# Patient Record
Sex: Female | Born: 1990 | Race: Black or African American | Hispanic: No | Marital: Married | Smoking: Never smoker
Health system: Southern US, Community
[De-identification: ages and names within clinical notes are randomized; demographics above are authoritative.]

## PROBLEM LIST (undated history)

## (undated) ENCOUNTER — Inpatient Hospital Stay (HOSPITAL_COMMUNITY): Payer: Self-pay

## (undated) ENCOUNTER — Inpatient Hospital Stay (HOSPITAL_COMMUNITY): Payer: No Typology Code available for payment source

## (undated) DIAGNOSIS — G43909 Migraine, unspecified, not intractable, without status migrainosus: Secondary | ICD-10-CM

## (undated) DIAGNOSIS — K589 Irritable bowel syndrome without diarrhea: Secondary | ICD-10-CM

## (undated) DIAGNOSIS — R002 Palpitations: Secondary | ICD-10-CM

## (undated) DIAGNOSIS — K5900 Constipation, unspecified: Secondary | ICD-10-CM

## (undated) DIAGNOSIS — A64 Unspecified sexually transmitted disease: Secondary | ICD-10-CM

## (undated) DIAGNOSIS — K219 Gastro-esophageal reflux disease without esophagitis: Secondary | ICD-10-CM

## (undated) DIAGNOSIS — D649 Anemia, unspecified: Secondary | ICD-10-CM

## (undated) DIAGNOSIS — F329 Major depressive disorder, single episode, unspecified: Secondary | ICD-10-CM

## (undated) DIAGNOSIS — E669 Obesity, unspecified: Secondary | ICD-10-CM

## (undated) DIAGNOSIS — F32A Depression, unspecified: Secondary | ICD-10-CM

## (undated) DIAGNOSIS — K5909 Other constipation: Secondary | ICD-10-CM

## (undated) DIAGNOSIS — F411 Generalized anxiety disorder: Secondary | ICD-10-CM

## (undated) HISTORY — PX: CHOLECYSTECTOMY: SHX55

## (undated) HISTORY — DX: Obesity, unspecified: E66.9

## (undated) HISTORY — DX: Migraine, unspecified, not intractable, without status migrainosus: G43.909

## (undated) HISTORY — DX: Gastro-esophageal reflux disease without esophagitis: K21.9

## (undated) HISTORY — PX: THERAPEUTIC ABORTION: SHX798

## (undated) HISTORY — DX: Palpitations: R00.2

## (undated) HISTORY — DX: Other constipation: K59.09

## (undated) HISTORY — DX: Irritable bowel syndrome, unspecified: K58.9

## (undated) HISTORY — DX: Unspecified sexually transmitted disease: A64

## (undated) HISTORY — DX: Constipation, unspecified: K59.00

## (undated) HISTORY — DX: Generalized anxiety disorder: F41.1

---
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
## 2009-01-15 ENCOUNTER — Ambulatory Visit: Payer: Self-pay | Admitting: Internal Medicine

## 2009-01-21 ENCOUNTER — Ambulatory Visit: Payer: Self-pay | Admitting: Internal Medicine

## 2009-03-24 ENCOUNTER — Encounter: Payer: Self-pay | Admitting: Internal Medicine

## 2009-03-24 ENCOUNTER — Ambulatory Visit: Payer: Self-pay | Admitting: Internal Medicine

## 2009-03-26 ENCOUNTER — Encounter (INDEPENDENT_AMBULATORY_CARE_PROVIDER_SITE_OTHER): Payer: Self-pay | Admitting: *Deleted

## 2009-04-20 ENCOUNTER — Ambulatory Visit: Payer: Self-pay | Admitting: Internal Medicine

## 2009-09-15 ENCOUNTER — Ambulatory Visit: Payer: Self-pay | Admitting: Internal Medicine

## 2009-09-15 DIAGNOSIS — K5900 Constipation, unspecified: Secondary | ICD-10-CM

## 2009-09-15 DIAGNOSIS — R21 Rash and other nonspecific skin eruption: Secondary | ICD-10-CM | POA: Insufficient documentation

## 2009-09-15 DIAGNOSIS — R635 Abnormal weight gain: Secondary | ICD-10-CM

## 2009-09-15 HISTORY — DX: Constipation, unspecified: K59.00

## 2009-09-15 LAB — CONVERTED CEMR LAB: TSH: 1.39 microintl units/mL (ref 0.35–5.50)

## 2009-09-17 ENCOUNTER — Encounter (INDEPENDENT_AMBULATORY_CARE_PROVIDER_SITE_OTHER): Payer: Self-pay | Admitting: *Deleted

## 2009-11-20 ENCOUNTER — Emergency Department (HOSPITAL_COMMUNITY): Admission: EM | Admit: 2009-11-20 | Discharge: 2009-11-20 | Payer: Self-pay | Admitting: Emergency Medicine

## 2009-11-20 ENCOUNTER — Telehealth: Payer: Self-pay | Admitting: Internal Medicine

## 2010-01-27 ENCOUNTER — Ambulatory Visit: Payer: Self-pay | Admitting: Internal Medicine

## 2010-01-27 ENCOUNTER — Encounter: Payer: Self-pay | Admitting: Internal Medicine

## 2010-01-27 DIAGNOSIS — R079 Chest pain, unspecified: Secondary | ICD-10-CM | POA: Insufficient documentation

## 2010-01-27 DIAGNOSIS — F329 Major depressive disorder, single episode, unspecified: Secondary | ICD-10-CM

## 2010-01-27 DIAGNOSIS — F419 Anxiety disorder, unspecified: Secondary | ICD-10-CM

## 2010-01-27 DIAGNOSIS — F411 Generalized anxiety disorder: Secondary | ICD-10-CM

## 2010-01-27 HISTORY — DX: Generalized anxiety disorder: F41.1

## 2010-01-28 ENCOUNTER — Ambulatory Visit: Payer: Self-pay | Admitting: Internal Medicine

## 2010-03-10 ENCOUNTER — Ambulatory Visit: Payer: Self-pay | Admitting: Internal Medicine

## 2010-03-12 ENCOUNTER — Encounter (INDEPENDENT_AMBULATORY_CARE_PROVIDER_SITE_OTHER): Payer: Self-pay | Admitting: *Deleted

## 2010-04-27 NOTE — Assessment & Plan Note (Signed)
Summary: DEPO/ Sammuel Cooper Natale Milch  Nurse Visit   Allergies: No Known Drug Allergies  Medication Administration  Injection # 1:    Medication: Depo-Provera 150mg     Diagnosis: FAMILY PLANNING (ICD-V25.09)    Route: IM    Site: RUOQ gluteus    Exp Date: 05/27/2011    Lot #: [id number]    Mfr: Francisca December    Patient tolerated injection without complications    Given by: Lucious Groves (April 20, 2009 11:11 AM)  Orders Added: 1)  Depo-Provera 150mg  [J1055] 2)  Admin of Therapeutic Inj  intramuscular or subcutaneous [04540]

## 2010-04-27 NOTE — Progress Notes (Signed)
Summary: ER - CP  Phone Note Call from Patient Call back at [facility] Phone [phone]   Summary of Call: Pt c/o CP off and on x's mths. She c/o chest pain & pressure this am that is more on left side. She also has shoulder discomfort. Pain is not relieved from OTC antacid. She has no SOB now but does when walking up stairs in her home. Advised her to go to ER now, she agreed. Pt will f/u after ER w/Dr [name].  Initial call taken by: Lamar Sprinkles, CMA,  November 20, 2009 12:19 PM  Follow-up for Phone Call        agree Follow-up by: Corwin Levins MD,  November 20, 2009 12:43 PM

## 2010-04-27 NOTE — Assessment & Plan Note (Signed)
Summary: STOMACH PAIN/NWS   Vital Signs:  Patient profile:   20 year old female Height:      70 inches (177.80 cm) Weight:      219.2 pounds (99.64 kg) BMI:     31.57 O2 Sat:      98 % on Room air Temp:     98.5 degrees F (36.94 degrees C) oral Pulse rate:   73 / minute BP sitting:   112 / 78  (left arm) Cuff size:   large  Vitals Entered By: Orlan Leavens (September 15, 2009 4:16 PM)  O2 Flow:  Room air CC: Stomach pain & nausea x's 1 month Is Patient Diabetic? No Pain Assessment Patient in pain? yes     Location: stomach Type: aching/cramping Comments Pt states she have'nt had a bowel movement in 1 week   CC:  Stomach pain & nausea x's 1 month.  History of Present Illness: here with above CC;  c/o no BM for 1 wk, with some crampy abd pain and nausea, but no vomiting, and is passing gas;  no fever, other abd pain, BRBPR;  last BM hard with straining; admits to poor diet, lack of regular excercise, gained 90 lbs in the past year on depoprovera, but finally stopped last month and just had initial menses last wk.  Has not tried any OTC's or stool softner.  No prior significant hx of this in the past;  no wt loss, night sweats, joint pains or rash or other constitutional symtpoms, except has had darkening nondiscrete area of bilat maxillary area and cheek without pain or swelling.  Overall o/w good compliance with meds and tolerating well.    Problems Prior to Update: 1)  Facial Rash  (ICD-782.1) 2)  Constipation  (ICD-564.00) 3)  Screening Examination For Pulmonary Tuberculosis  (ICD-V74.1) 4)  Back Pain  (ICD-724.5) 5)  Sinusitis- Acute-nos  (ICD-461.9) 6)  Pelvic Pain  (ICD-789.09) 7)  Family Planning  (ICD-V25.09)  Medications Prior to Update: 1)  Hydrocodone-Acetaminophen 5-325 Mg Tabs (Hydrocodone-Acetaminophen) .Marland Kitchen.. 1 - 2 By Mouth Q 6 Hrs As Needed Pain 2)  Flexeril 5 Mg Tabs (Cyclobenzaprine Hcl) .Marland Kitchen.. 1 By Mouth Three Times A Day As Needed  Current Medications  (verified): 1)  Lactulose  Soln (Lactulose) .... 30- 60 Cc By Mouth Two Times A Day As Needed 2)  Promethazine Hcl 25 Mg Tabs (Promethazine Hcl) .Marland Kitchen.. 1po Q 6 Hrs As Needed Nausea  Allergies (verified): No Known Drug Allergies  Past History:  Past Medical History: Last updated: 01/16/2008 Unremarkable  Past Surgical History: Last updated: 12/23/2006 Denies surgical history  Social History: Last updated: 01/16/2008 Single Never Smoked  Risk Factors: Smoking Status: never (01/16/2008)  Review of Systems       all otherwise negative per pt -    Physical Exam  General:  alert and overweight-appearing.   Head:  normocephalic and atraumatic.   Eyes:  vision grossly intact, pupils equal, and pupils round.   Ears:  R ear normal and L ear normal.   Nose:  no external deformity and no nasal discharge.   Mouth:  no gingival abnormalities and pharynx pink and moist.   Neck:  supple and no masses.   Lungs:  normal respiratory effort and normal breath sounds.   Heart:  normal rate and regular rhythm.   Abdomen:  soft, non-tender, normal bowel sounds, no guarding, no hepatomegaly, and no splenomegaly.   Extremities:  no edema, no erythema  Skin:  with bilat facial/cheek  darkening nondiscrete wtihout erythema, swelling   Impression & Recommendations:  Problem # 1:  CONSTIPATION (ICD-564.00) PE benign, doubt obstruction or ileus;  will check  TSH, gave rx for lactulose, but should try OTC laxative and stoool softner, regular diet with more fiber, better excercise, wt loss Orders: TLB-TSH (Thyroid Stimulating Hormone) (84443-TSH)  Problem # 2:  FACIAL RASH (ICD-782.1) ? etiology - for derm referral Orders: Dermatology Referral (Derma)  Problem # 3:  WEIGHT GAIN (ICD-783.1) severe ; agree with d/c depoprovera, f/u GYN for further birth control, check TSH as above, denies OSA symptoms;  for increased excercise, reduced calories; d/w pt should avoid meds for wt loss for  now  Complete Medication List: 1)  Lactulose Soln (Lactulose) .... 30- 60 cc by mouth two times a day as needed 2)  Promethazine Hcl 25 Mg Tabs (Promethazine hcl) .Marland Kitchen.. 1po q 6 hrs as needed nausea  Patient Instructions: 1)  Please take all new medications as prescribed  - the lactulose 2)  You can also try the OTC magnesium citrate and/or dulcolox pills OTC for constipation to get started 3)  You can also try Fleets enema, or OTC glycerin suppository if needed 4)  please get more excericise and diet changes as we discussed 5)  For the long term, I would take Miralax daily (OTC) and stool softner on a daily basis 6)  Please go to the Lab in the basement for your blood tests today for the thyroid 7)  Please take all new medications as prescribed - nausea medication 8)  please call the number on the blue card for the blood test results 9)  Please schedule a follow-up appointment as needed. 10)  You will be contacted about the referral(s) to: Dermatology Prescriptions: PROMETHAZINE HCL 25 MG TABS (PROMETHAZINE HCL) 1po q 6 hrs as needed nausea  #40 x 1   Entered and Authorized by:   Corwin Levins MD   Signed by:   Corwin Levins MD on 09/15/2009   Method used:   Print then Give to Patient   RxID:   [id number] LACTULOSE  SOLN (LACTULOSE) 30- 60 cc by mouth two times a day as needed  #1bottle x 1   Entered and Authorized by:   Corwin Levins MD   Signed by:   Corwin Levins MD on 09/15/2009   Method used:   Print then Give to Patient   RxID:   [id number]

## 2010-04-27 NOTE — Letter (Signed)
Summary: [facility] Consult Scheduled Letter  [facility]  [address] [place], [place] [address]   Phone: [phone]  Fax: [phone]      09/17/2009 MRN: [id number]  Linda Fuller [address] [place], [place]  [address]    Dear Ms. BECK,      We have scheduled an appointment for you.  At the recommendation of Dr.John, we have scheduled you a consult with Dr Sharyn Lull on 09/29/09 at 1:30pm.  Their phone number is [phone].  If this appointment day and time is not convenient for you, please feel free to call the office of the doctor you are being referred to at the number listed above and reschedule the appointment.    Dermatology Specialist [address] [place], [place] [address]    Thank you,    Patient Care Coordinator [facility]

## 2010-04-27 NOTE — Assessment & Plan Note (Signed)
Summary: INDIGESTION/DIET PLAN/CD   Vital Signs:  Patient profile:   20 year old female Height:      69 inches Weight:      228.50 pounds BMI:     33.87 O2 Sat:      98 % on Room air Temp:     98.4 degrees F oral Pulse rate:   88 / minute BP sitting:   106 / 68  (left arm) Cuff size:   large  Vitals Entered By: Zella Ball Ewing CMA Duncan Dull) (January 27, 2010 4:06 PM)  O2 Flow:  Room air CC: Chest and shoulder pain/RE   CC:  Chest and shoulder pain/RE.  History of Present Illness: here wtih concerns;   c/o 2  mo intermittent but very often pain almost constant for 2 wks; located left chest and towards the left shoulder; dull, sometimes radiates to the back adn sometimes to the right chest;  asoc with nausea (and vomit x 1 last wk but not sure if it was food);  and some sob, but no palp or syncope.  Not clearly pleuritic;  and no worse with exertion.  Did not any OTC meds or antacids.   Gained about 10 lbs in the past 4 months iwth dietary excess, and lack of excericse. Tried treadmill yest but became concerned about the pain adn stopped htough pain not worse.  Currently working 2 jobs, mother with pacemaker and mult family deceased with heart disease. No hx of DM, HTN, smoking,  not sure about chol   .  Marked stress over the past 2 mo.  Denies worsening depressive symptoms, suicidal ideation, or panic.    Problems Prior to Update: 1)  Anxiety  (ICD-300.00) 2)  Chest Pain  (ICD-786.50) 3)  Weight Gain  (ICD-783.1) 4)  Facial Rash  (ICD-782.1) 5)  Constipation  (ICD-564.00) 6)  Screening Examination For Pulmonary Tuberculosis  (ICD-V74.1) 7)  Back Pain  (ICD-724.5) 8)  Sinusitis- Acute-nos  (ICD-461.9) 9)  Pelvic Pain  (ICD-789.09) 10)  Family Planning  (ICD-V25.09)  Medications Prior to Update: 1)  Lactulose  Soln (Lactulose) .... 30- 60 Cc By Mouth Two Times A Day As Needed 2)  Promethazine Hcl 25 Mg Tabs (Promethazine Hcl) .Marland Kitchen.. 1po Q 6 Hrs As Needed Nausea  Current Medications  (verified): 1)  Lactulose  Soln (Lactulose) .... 30- 60 Cc By Mouth Two Times A Day As Needed 2)  Naproxen 500 Mg Tabs (Naproxen) .Marland Kitchen.. 1po Two Times A Day As Needed For Pain 3)  Nexium 40 Mg Cpdr (Esomeprazole Magnesium) .Marland Kitchen.. 1po Once Daily 4)  Amitiza 24 Mcg Caps (Lubiprostone) .Marland Kitchen.. 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   Head:  normocephalic and atraumatic.   Eyes:  vision grossly intact, pupils equal, and pupils round.   Ears:  R ear normal  and L ear normal.   Nose:  no external deformity and no nasal discharge.   Mouth:  no gingival abnormalities and pharynx pink and moist.   Neck:  supple and no masses.   Lungs:  normal respiratory effort and normal breath sounds.   Heart:  normal rate and regular rhythm.   Abdomen:  soft and normal bowel sounds.  with diffuse mild tender without guarding or rebound Extremities:  no edema, no erythema  Skin:  color normal and no rashes.   Psych:  dysphoric affect and moderately anxious.     Impression & Recommendations:  Problem # 1:  CHEST PAIN (ICD-786.50)  Orders: T-2 View CXR, Same Day (71020.5TC) EKG w/ Interpretation (93000) ? msk vs GI vs other - for cxr, ECG reveiwed, and triall naproxen two times a day as needed ;also gave trial or PPI -  nexium 40 mg for 1 wk and rx if improved  Problem # 2:  CONSTIPATION (ICD-564.00) for the amitiza 24 two times a day  - I suspect prob IBS related  Problem # 3:  ANXIETY (ICD-300.00) with ? depression; declines tx at this time though I suspect she would benefit from trial SSRI  Complete Medication List: 1)  Lactulose Soln (Lactulose) .... 30- 60 cc by mouth two times a day as needed 2)  Naproxen 500 Mg Tabs (Naproxen) .Marland Kitchen.. 1po two times a day as needed for pain 3)  Nexium 40 Mg Cpdr (Esomeprazole magnesium) .Marland Kitchen.. 1po once daily 4)  Amitiza 24 Mcg Caps (Lubiprostone) .Marland Kitchen.. 1po two times a day  Patient Instructions: 1)  Please start the nexium 40 mg per day for possible reflux 2)  If this helps, you can continue with the prescription 3)  If it does not, you can try the naproxen for pain as needed  4)  You can also take the amitiza samples two times a day for constipation (with the samples first) 5)  Continue all previous medications as before this visit  6)  Please go to Radiology in the basement level for your X-Ray today  7)  Please call the number on the [facility] Card for results of your testing  8)  Your EKG was OK today 9)  Please schedule a follow-up appointment as needed. Prescriptions: AMITIZA 24 MCG CAPS (LUBIPROSTONE) 1po two times a day  #60 x 11   Entered and Authorized by:   Corwin Levins MD   Signed by:   Corwin Levins MD on 01/27/2010   Method used:   Print then Give to Patient   RxID:   [id number] NEXIUM 40 MG CPDR (ESOMEPRAZOLE MAGNESIUM) 1po once daily  #30 x 11   Entered and Authorized by:   Corwin Levins MD   Signed by:   Corwin Levins MD on 01/27/2010   Method used:   Print then Give to Patient   RxID:   [id number] NAPROXEN 500 MG TABS (NAPROXEN) 1po two times a day as needed for pain  #60 x 2   Entered and Authorized by:   Corwin Levins MD   Signed by:   Corwin Levins MD on 01/27/2010   Method used:   Print then Give to Patient   RxID:    [id number]    Orders Added: 1)  EKG w/ Interpretation [93000] 2)  T-2 View CXR, Same Day [71020.5TC] 3)  EKG w/ Interpretation [93000] 4)  Est. Patient Level IV [54270]

## 2010-04-29 NOTE — Letter (Signed)
Summary: TB Skin Test  All     ,     Phone:   Fax:           TB Skin Test    Linda Fuller    Date TB Test Placed:   L forearm  TB Test Placed by:  Margaret Pyle, CMA  Lot #:  [id number]   Expiration Date: 05./28/2013  Date TB Test Read: March 12, 2010 8:25 AM  Result <5 MM  TB Test Read by:  Margaret Pyle, CMA

## 2010-04-29 NOTE — Assessment & Plan Note (Signed)
Summary: TB TEST/ JWJ /NWS  Nurse Visit   Allergies: No Known Drug Allergies  Immunizations Administered:  PPD Skin Test:    Vaccine Type: PPD    Site: left forearm    Mfr: Sanofi Pasteur    Dose: 0.1 ml    Route: ID    Given by: Margaret Pyle, CMA    Exp. Date: 08/23/2011    Lot #: [id number]  PPD Results    Date of reading: 03/12/2010    Results: < 5mm    Interpretation: negative  Orders Added: 1)  TB Skin Test [86580] 2)  Admin 1st Vaccine [47829]

## 2010-06-11 LAB — POCT I-STAT, CHEM 8
BUN: 10 mg/dL (ref 6–23)
Calcium, Ion: 1.17 mmol/L (ref 1.12–1.32)
Chloride: 107 mEq/L (ref 96–112)
Glucose, Bld: 93 mg/dL (ref 70–99)

## 2010-06-11 LAB — POCT CARDIAC MARKERS: Troponin i, poc: <0.05 ng/mL (ref 0.00–0.09)

## 2010-08-16 ENCOUNTER — Encounter: Payer: Self-pay | Admitting: Internal Medicine

## 2010-08-16 ENCOUNTER — Ambulatory Visit: Payer: Self-pay | Admitting: Internal Medicine

## 2010-08-16 DIAGNOSIS — Z0000 Encounter for general adult medical examination without abnormal findings: Secondary | ICD-10-CM | POA: Insufficient documentation

## 2010-08-17 ENCOUNTER — Ambulatory Visit (INDEPENDENT_AMBULATORY_CARE_PROVIDER_SITE_OTHER)
Admission: RE | Admit: 2010-08-17 | Discharge: 2010-08-17 | Disposition: A | Discharge status: Home / Self Care | Payer: PRIVATE HEALTH INSURANCE | Source: Ambulatory Visit | Attending: Internal Medicine | Admitting: Internal Medicine

## 2010-08-17 ENCOUNTER — Encounter: Payer: Self-pay | Admitting: Internal Medicine

## 2010-08-17 ENCOUNTER — Ambulatory Visit (INDEPENDENT_AMBULATORY_CARE_PROVIDER_SITE_OTHER): Payer: PRIVATE HEALTH INSURANCE | Admitting: Internal Medicine

## 2010-08-17 ENCOUNTER — Other Ambulatory Visit: Payer: PRIVATE HEALTH INSURANCE

## 2010-08-17 VITALS — BP 122/78 | HR 87 | Temp 97.6°F | Resp 14 | Wt 223.5 lb

## 2010-08-17 DIAGNOSIS — R1084 Generalized abdominal pain: Secondary | ICD-10-CM

## 2010-08-17 DIAGNOSIS — F411 Generalized anxiety disorder: Secondary | ICD-10-CM

## 2010-08-17 DIAGNOSIS — K5900 Constipation, unspecified: Secondary | ICD-10-CM

## 2010-08-17 NOTE — Assessment & Plan Note (Signed)
stable overall by hx and exam, most recent lab reviewed with pt, and pt to continue medical treatment as before ble  Lab Results  Component Value Date   HGB 13.9 11/20/2009   HCT 41.0 11/20/2009   NA 138 11/20/2009   K 4.2 11/20/2009   CL 107 11/20/2009   CREATININE 0.9 11/20/2009   BUN 10 11/20/2009   TSH 1.39 09/15/2009   Declines SSRI trial, or counseling

## 2010-08-17 NOTE — Progress Notes (Signed)
  Subjective:    Patient ID: Linda Fuller, female    DOB: 1990/06/01, 20 y.o.   MRN: [id number]  HPI  Here with 2 wks unable to have BM (not unsual to go 1-1.5 wks) but now with increased pain, gas feeling, belching,and n/v, worse to eat food , keeps down liquids ok,   Pt denies fever, wt loss, night sweats, loss of appetite, or other constitutional symptoms  Pt denies chest pain, increased sob or doe, wheezing, orthopnea, PND, increased LE swelling, palpitations, dizziness or syncope.  Pt denies new neurological symptoms such as new headache, or facial or extremity weakness or numbness.   Pt denies polydipsia, polyuria,  But feels extremely bloated and full.  lacutlose only worked for a couple of days after last visit. Did not try the amitiza. Did try OTC benefiber, stool softners, and seems only way to have BM is with laxative such as dulcolox. Working 1 full time and 1 part time job, with increased stress. LMP - on menses now.  Had recent UTI per GYN - now  Denies urinary symptoms such as dysuria, frequency, urgency,or hematuria. Denies worsening depressive symptoms, suicidal ideation, or panic, though has ongoing anxiety, not increased recently.  Past Medical History  Diagnosis Date  . ANXIETY 01/27/2010  . CONSTIPATION 09/15/2009   No past surgical history on file.  reports that she has been smoking.  She does not have any smokeless tobacco history on file. Her alcohol and drug histories not on file. family history includes Hypertension in her other. Not on File  Review of Systems All otherwise neg per pt     Objective:   Physical Exam BP 122/78  Pulse 87  Temp(Src) 97.6 F (36.4 C) (Oral)  Resp 14  Wt 223 lb 8 oz (101.379 kg)  SpO2 99%  LMP 08/17/2010 Physical Exam  VS noted Constitutional: Pt appears well-developed and well-nourished.  HENT: Head: Normocephalic.  Right Ear: External ear normal.  Left Ear: External ear normal.  Eyes: Conjunctivae and EOM are normal. Pupils are  equal, round, and reactive to light.  Neck: Normal range of motion. Neck supple.  Cardiovascular: Normal rate and regular rhythm.   Pulmonary/Chest: Effort normal and breath sounds normal.  Abd:  Soft, non-distended, + BS, mild diffuse tender with some bloating/distension, no guarding or rebound  Neurological: Pt is alert. No cranial nerve deficit.  Skin: Skin is warm. No erythema.  Psychiatric: Pt behavior is normal. Thought content normal. 1+ nervous        Assessment & Plan:

## 2010-08-17 NOTE — Patient Instructions (Signed)
You should try OTC magnesium citrate OTC x 1 bottle today or in the AM You should also start Miralax 17 gm in 8 oz water, daily You can also take as needed dulcolox, or fleets enema You can consider taking OTC Colace 100 mg twice per day for stool softening as well (although this is not a laxative) Please go to LAB in the Basement for the blood and/or urine tests to be done today Please go to XRAY in the Basement for the x-ray test - abdominal film to make sure no obstruction Please call the phone number [phone] (the PhoneTree System) for results of testing in 2-3 days;  When calling, simply dial the number, and when prompted enter the MRN number above (the Medical Record Number) and the # key, then the message should start. You will be contacted regarding the referral for: Gastroenterology

## 2010-08-17 NOTE — Assessment & Plan Note (Signed)
See constipation discussion;  For abd films today with recent n/v

## 2010-08-17 NOTE — Progress Notes (Signed)
Quick Note:  Voice message left on PhoneTree system - lab is negative, normal or otherwise stable, pt to continue same tx ______ 

## 2010-08-17 NOTE — Assessment & Plan Note (Signed)
Mod to severe, likely exac by long work hours, stress, dietary changes, lack of exercise, but cant r/o other - for labs today including UA with recent UTI;  For now tx with mag citrate 1 bottle OTC, but also take miralax daily, and consider dulcolox prn intermittent worsening;  Also with recent vomiting will need abd films but doubt obstruction;  Also since ongoing 2 yrs will refer GI per pt request

## 2010-08-18 ENCOUNTER — Encounter: Payer: Self-pay | Admitting: Gastroenterology

## 2010-09-14 ENCOUNTER — Ambulatory Visit (INDEPENDENT_AMBULATORY_CARE_PROVIDER_SITE_OTHER): Payer: PRIVATE HEALTH INSURANCE | Admitting: Gastroenterology

## 2010-09-14 ENCOUNTER — Encounter: Payer: Self-pay | Admitting: Gastroenterology

## 2010-09-14 ENCOUNTER — Other Ambulatory Visit (INDEPENDENT_AMBULATORY_CARE_PROVIDER_SITE_OTHER): Payer: PRIVATE HEALTH INSURANCE

## 2010-09-14 DIAGNOSIS — K5900 Constipation, unspecified: Secondary | ICD-10-CM

## 2010-09-14 DIAGNOSIS — R109 Unspecified abdominal pain: Secondary | ICD-10-CM

## 2010-09-14 LAB — CBC WITH DIFFERENTIAL/PLATELET
Basophils Relative: 1.1 % (ref 0.0–3.0)
Eosinophils Relative: 1.7 % (ref 0.0–5.0)
HCT: 36.3 % (ref 36.0–46.0)
Hemoglobin: 12.3 g/dL (ref 12.0–15.0)
Lymphocytes Relative: 37.9 % (ref 12.0–46.0)
Lymphs Abs: 2 10*3/uL (ref 0.7–4.0)
Monocytes Relative: 8.8 % (ref 3.0–12.0)
Neutro Abs: 2.6 10*3/uL (ref 1.4–7.7)
RBC: 3.92 Mil/uL (ref 3.87–5.11)
RDW: 13.4 % (ref 11.5–14.6)
WBC: 5.2 10*3/uL (ref 4.5–10.5)

## 2010-09-14 LAB — COMPREHENSIVE METABOLIC PANEL
BUN: 13 mg/dL (ref 6–23)
CO2: 28 mEq/L (ref 19–32)
Calcium: 9.8 mg/dL (ref 8.4–10.5)
Chloride: 107 mEq/L (ref 96–112)
Creatinine, Ser: 0.8 mg/dL (ref 0.4–1.2)
GFR: 125.11 mL/min (ref >60.00)
Glucose, Bld: 81 mg/dL (ref 70–99)
Total Bilirubin: 0.4 mg/dL (ref 0.3–1.2)

## 2010-09-14 LAB — TSH: TSH: 0.71 u[IU]/mL (ref 0.35–5.50)

## 2010-09-14 NOTE — Progress Notes (Signed)
HPI: This is a  very pleasant 20 year old woman  Has had constipation, bloating, gas, fatigue, vomiting, nausea for 2 years.  "attitude changes".  Can have 1 bm a week usually.  Started metamucil 3 weeks ago, caused worse bloating but started having a BM more often.  Has never tried [place] pills.  Has tried miralax once.  Takes laxatives about once per month.  Never seen overt bleeding.  Has gained 30 pounds in the past year.  Once has scibbolous stools now.      Review of systems: Pertinent positive and negative review of systems were noted in the above HPI section.  All other review of systems was otherwise negative.   Past Medical History  Diagnosis Date  . ANXIETY 01/27/2010  . CONSTIPATION 09/15/2009    No past surgical history on file.   reports that she has never smoked. She has never used smokeless tobacco. She reports that she does not drink alcohol or use illicit drugs.  family history includes Diabetes in her mother and Heart disease in her mother.  There is no history of Colon cancer.    Current Medications, Allergies were all reviewed with the patient via [facility] electronic medical record system.    Physical Exam: Ht 5\' 9"  (1.753 m)  Wt 218 lb (98.884 kg)  BMI 32.19 kg/m2  LMP 08/17/2010 Constitutional: generally well-appearing Psychiatric: alert and oriented x3 Eyes: extraocular movements intact Mouth: oral pharynx moist, no lesions Neck: supple no lymphadenopathy Cardiovascular: heart regular rate and rhythm Lungs: clear to auscultation bilaterally Abdomen: soft, nontender, nondistended, no obvious ascites, no peritoneal signs, normal bowel sounds Extremities: no lower extremity edema bilaterally Skin: no lesions on visible extremities    Assessment and plan: 20 y.o. female with chronic constipation, multiple abdominal discomforts  I think her constipation is causing most of her other symptoms. She is going to complete a purging of her bowels  over the next 24 hours and then she will start 2 doses of MiraLax once daily. She will continue on fiber supplements once daily. She will call back in 3-4 weeks to report on her symptoms. She will get some basic labs, outlined below. If she is not improving after about a month then I will have to consider sigmoidoscopy and possibly a trial of amitiza.

## 2010-09-14 NOTE — Patient Instructions (Signed)
You will have labs checked today in the basement lab.  Please head down after you check out with the front desk  (cbc, cmet, tsh). Miralax purge of your bowels.  Take 6 doses tonight with adequate fluid, repeat the 6 doses in AM.   Starting Thursday, take 2 doses of miralax once every morning. Continue 1 dose of metamucil every day. Call Dr. Christella Hartigan office in 4 weeks to report on your symptoms.  If no improvement in symptoms, may need flexible sigmoidoscopy, possible amitiza trial. A copy of this information will be made available to Dr. [name].

## 2011-02-16 ENCOUNTER — Ambulatory Visit (INDEPENDENT_AMBULATORY_CARE_PROVIDER_SITE_OTHER): Payer: PRIVATE HEALTH INSURANCE | Admitting: Internal Medicine

## 2011-02-16 ENCOUNTER — Encounter: Payer: Self-pay | Admitting: Internal Medicine

## 2011-02-16 VITALS — BP 120/70 | HR 98 | Temp 98.1°F | Ht 68.0 in | Wt 216.1 lb

## 2011-02-16 DIAGNOSIS — Z3201 Encounter for pregnancy test, result positive: Secondary | ICD-10-CM

## 2011-02-16 DIAGNOSIS — F411 Generalized anxiety disorder: Secondary | ICD-10-CM

## 2011-02-16 DIAGNOSIS — Z3202 Encounter for pregnancy test, result negative: Secondary | ICD-10-CM | POA: Insufficient documentation

## 2011-02-16 NOTE — Patient Instructions (Signed)
Continue all other medications as before Please go to LAB in the Basement for the blood and/or urine tests to be done today Please call the phone number [phone] (the PhoneTree System) for results of testing in 2-3 days;  When calling, simply dial the number, and when prompted enter the MRN number above (the Medical Record Number) and the # key, then the message should start. You will be contacted regarding the referral for: OB/GYN

## 2011-02-17 ENCOUNTER — Encounter: Payer: Self-pay | Admitting: Internal Medicine

## 2011-02-17 NOTE — Assessment & Plan Note (Signed)
stable overall by hx and exam, most recent data reviewed with pt, and pt to continue medical treatment as before  Lab Results  Component Value Date   WBC 5.2 09/14/2010   HGB 12.3 09/14/2010   HCT 36.3 09/14/2010   PLT 245.0 09/14/2010   GLUCOSE 81 09/14/2010   ALT 18 09/14/2010   AST 17 09/14/2010   NA 140 09/14/2010   K 5.0 09/14/2010   CL 107 09/14/2010   CREATININE 0.8 09/14/2010   BUN 13 09/14/2010   CO2 28 09/14/2010   TSH 0.71 09/14/2010

## 2011-02-17 NOTE — Assessment & Plan Note (Signed)
With home preg test pos, missed menses date, and o/w asympt, no HIV risk factors, will need OB/GYN referral, and quant bHCG ,  to f/u any worsening symptoms or concerns

## 2011-02-17 NOTE — Progress Notes (Signed)
  Subjective:    Patient ID: Linda Fuller, female    DOB: 05-Oct-1990, 20 y.o.   MRN: [id number]  HPI  Here to f/u after missed period with LMP approx 4 wks; 4 home preg tests positive and asks for confirmatory testing;  O/w Pt denies chest pain, increased sob or doe, wheezing, orthopnea, PND, increased LE swelling, palpitations, dizziness or syncope. Pt denies new neurological symptoms such as new headache, or facial or extremity weakness or numbness  Pt denies polydipsia, polyuria. Did have pregnancy unintended at 20yo with abortion but plans to take to term. Would like GYN referral. Denies other symptoms such as fever, vag d/c, abd or pelvic pain, rash/ulcer or hx of STD.  Denies worsening depressive symptoms, suicidal ideation, or panic Past Medical History  Diagnosis Date  . ANXIETY 01/27/2010  . CONSTIPATION 09/15/2009   No past surgical history on file.  reports that she has never smoked. She has never used smokeless tobacco. She reports that she does not drink alcohol or use illicit drugs. family history includes Diabetes in her mother and Heart disease in her mother.  There is no history of Colon cancer. No Known Allergies Current Outpatient Prescriptions on File Prior to Visit  Medication Sig Dispense Refill  . psyllium (METAMUCIL) 58.6 % powder Take 1 packet by mouth daily.         Review of Systems All otherwise neg per pt    Objective:   Physical Exam BP 120/70  Pulse 98  Temp(Src) 98.1 F (36.7 C) (Oral)  Ht 5\' 8"  (1.727 m)  Wt 216 lb 2 oz (98.034 kg)  BMI 32.86 kg/m2  SpO2 98%  LMP 01/11/2011 Physical Exam  VS noted Constitutional: Pt appears well-developed and well-nourished.  HENT: Head: Normocephalic.  Right Ear: External ear normal.  Left Ear: External ear normal.  Eyes: Conjunctivae and EOM are normal. Pupils are equal, round, and reactive to light.  Neck: Normal range of motion. Neck supple.  Cardiovascular: Normal rate and regular rhythm.   Pulmonary/Chest:  Effort normal and breath sounds normal.  Abd:  Soft, NT, non-distended, + BS Neurological: Pt is alert. No cranial nerve deficit.  Skin: Skin is warm. No erythema.  Psychiatric: Pt behavior is normal. Thought content normal. 1+ nervous    Assessment & Plan:

## 2011-02-18 ENCOUNTER — Ambulatory Visit: Payer: PRIVATE HEALTH INSURANCE

## 2011-02-18 DIAGNOSIS — Z3201 Encounter for pregnancy test, result positive: Secondary | ICD-10-CM

## 2011-03-18 LAB — OB RESULTS CONSOLE HIV ANTIBODY (ROUTINE TESTING): HIV: NONREACTIVE

## 2011-03-18 LAB — OB RESULTS CONSOLE ANTIBODY SCREEN: Antibody Screen: NEGATIVE

## 2011-03-18 LAB — OB RESULTS CONSOLE RUBELLA ANTIBODY, IGM: Rubella: NON-IMMUNE/NOT IMMUNE

## 2011-03-18 LAB — OB RESULTS CONSOLE GC/CHLAMYDIA: Gonorrhea: NEGATIVE

## 2011-03-18 LAB — OB RESULTS CONSOLE ABO/RH: RH Type: POSITIVE

## 2011-03-24 ENCOUNTER — Encounter: Payer: No Typology Code available for payment source | Admitting: *Deleted

## 2011-03-24 ENCOUNTER — Encounter: Payer: Self-pay | Admitting: *Deleted

## 2011-03-24 DIAGNOSIS — E669 Obesity, unspecified: Secondary | ICD-10-CM | POA: Insufficient documentation

## 2011-03-24 DIAGNOSIS — Z3201 Encounter for pregnancy test, result positive: Secondary | ICD-10-CM

## 2011-03-24 DIAGNOSIS — Z713 Dietary counseling and surveillance: Secondary | ICD-10-CM | POA: Insufficient documentation

## 2011-03-25 NOTE — Progress Notes (Signed)
  Medical Nutrition Therapy:  Appt start time: 1530 end time:  1630.  Assessment:  Primary concerns today: Patient is [redacted] weeks pregnant and was referred for concerns about obesity and appropriate weight gain during her pregnancy. She states her pre-pregnancy weight was 219# and she is having hyper-emesis. She works full time as a Lawyer. Her current weight is 206.8 # indicating a weight loss of 12 pounds in 11 weeks.   MEDICATIONS: see list   DIETARY INTAKE:  Usual eating pattern includes 2 meals and no snacks per day.  Everyday foods include fast food, sweetened beverages and some variety of meat, starch and vegetables.  Avoided foods include plain milk.    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   Nutritional Diagnosis:  NI-1.6 Predicted suboptional energy  As related to weight loss due to emesis during pregnancy.  As evidenced by weight loss of 12 pounds since conception.    Intervention:  Nutrition counseling provided on adequate nutrition during pregnancy and importance of a variety of food groups, low in fat to assist with weight management. Suggest smaller more frequent meals to assist with hyper emesis. Patient able to drink chocolate milk so suggest use of Carnation Essentials for breakfast nutrition. Patient also encouraged to drink water or Crystal light in place of sweeter beverages. Goals: Try to drink [employer] in 2% milk as breakfast meal Choose fewer high fat foods such as french fries Include fruits and vegetables daily Substitute water and Crystal Light for sweet  tea and lemonade   Handouts given during visit include:  Carb Counting and Beyond & Label Reading handouts  Gestational Diabetes handout For Her Information Only  Meal Planning Card  Monitoring/Evaluation:  Dietary intake, exercise, reading food labels, and body weight in 4 weeks or as needed.

## 2011-03-25 NOTE — Patient Instructions (Signed)
Goals: Try to drink [employer] in 2% milk as breakfast meal Choose fewer high fat foods such as french fries Include fruits and vegetables daily Substitute water and Crystal Light for sweet tea and lemonade

## 2011-03-29 NOTE — L&D Delivery Note (Signed)
Delivery Note  Complete dilation at 2230 Onset of pushing at 2230 FHR second stage baseline 145 with variables  Analgesia /Anesthesia intrapartum: epidural  Delivery of a viable female at 2244 by SNM in LOA position.  Nuchal Cord -none. Cord double clamped after cessation of pulsation, cut by significant other.  Cord blood sample collected.   Placenta delivered by Tomasa Blase intact with 3 VC.  Placenta to for disposal. Uterine tone firm with massage bleeding moderate  Left labial minora with brisk bleeding - single 4.0 vicryl stitch placed for hemostasis  Small right superficial vaginal laceration - 4.0 vicryl repair x 2 stitch placed for hemostasis  Perineum intact Est. Blood Loss (mL): 250 mL  Complications: none  Mom to postpartum.  Baby to mom.  Raelyn Mora, SNM 10/15/2011, 11:25 PM Supervised by [name], CNM

## 2011-04-21 ENCOUNTER — Encounter: Payer: Self-pay | Admitting: *Deleted

## 2011-04-21 ENCOUNTER — Encounter: Payer: No Typology Code available for payment source | Admitting: *Deleted

## 2011-04-21 DIAGNOSIS — Z713 Dietary counseling and surveillance: Secondary | ICD-10-CM | POA: Insufficient documentation

## 2011-04-21 DIAGNOSIS — E669 Obesity, unspecified: Secondary | ICD-10-CM | POA: Insufficient documentation

## 2011-04-21 DIAGNOSIS — O99210 Obesity complicating pregnancy, unspecified trimester: Secondary | ICD-10-CM | POA: Insufficient documentation

## 2011-04-21 NOTE — Patient Instructions (Signed)
Plan: Continue with multiple vegetables and fruits per day in place of fried foods Continue with Carnation Essential powder for breakfast meal Aim to plan a grocery list so foods are on hand to prepare at home more than eating out Aim to prepare more than one meal when cooking so can be eaten later in the week or frozen Continue with drinking water regularly

## 2011-04-21 NOTE — Progress Notes (Signed)
  Medical Nutrition Therapy:  Appt start time: 1530 end time:  1600.  Assessment:  Primary concerns today: Patient returns for follow up visit. She has gained 6 pounds but is making much healthier food choices and is no longer having hyper-emesis. She is still working 2 jobs for a total of about 70 hours a week.  MEDICATIONS: see list   DIETARY INTAKE: Usual eating pattern includes 3 meals and occasional snacks of fresh fruit per day.  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   Nutritional Diagnosis:  NI-1.6 Predicted suboptional energy  As related to weight loss due to emesis during pregnancy.  As evidenced by weight loss of 12 pounds since conception.    Intervention:  Congratulated her on improvements she is making with her food choices. She has gained about 6 pounds but she is still below her pre-pregnancy weight and she is eating healthier. Reviewed grocery list ideas and suggested she prepare more than one portion when cooking at home that can be eaten for additional meals that week or saved in freezer. She didn't care for Crystal Light, but may be interested in Caffeine free flavored teas. Plan: Continue with multiple vegetables and fruits per day in place of fried foods Continue with Carnation Essential powder for breakfast meal Aim to plan a grocery list so foods are on hand to prepare at home more than eating out Aim to prepare more than one meal when cooking so can be eaten later in the week or frozen Continue with drinking water regularly  Handouts given during visit include:  Menu Planner Sheet  Monitoring/Evaluation:  Dietary intake, exercise, reading food labels, and body weight  as needed.

## 2011-09-09 LAB — OB RESULTS CONSOLE GBS: GBS: NEGATIVE

## 2011-10-15 ENCOUNTER — Inpatient Hospital Stay (HOSPITAL_COMMUNITY)
Admission: AD | Admit: 2011-10-15 | Discharge: 2011-10-17 | DRG: 775 | Disposition: A | Discharge status: Home / Self Care | Payer: No Typology Code available for payment source | Source: Ambulatory Visit | Attending: Obstetrics and Gynecology | Admitting: Obstetrics and Gynecology

## 2011-10-15 ENCOUNTER — Encounter (HOSPITAL_COMMUNITY): Payer: Self-pay | Admitting: *Deleted

## 2011-10-15 ENCOUNTER — Encounter (HOSPITAL_COMMUNITY): Payer: Self-pay | Admitting: Anesthesiology

## 2011-10-15 ENCOUNTER — Inpatient Hospital Stay (HOSPITAL_COMMUNITY): Payer: No Typology Code available for payment source | Admitting: Anesthesiology

## 2011-10-15 HISTORY — DX: Anemia, unspecified: D64.9

## 2011-10-15 LAB — CBC
HCT: 37.1 % (ref 36.0–46.0)
Hemoglobin: 12.1 g/dL (ref 12.0–15.0)
MCH: 30.6 pg (ref 26.0–34.0)
MCHC: 32.6 g/dL (ref 30.0–36.0)
MCV: 93.7 fL (ref 78.0–100.0)
Platelets: 188 10*3/uL (ref 150–400)
RBC: 3.96 MIL/uL (ref 3.87–5.11)
RDW: 14.3 % (ref 11.5–15.5)
WBC: 7.2 10*3/uL (ref 4.0–10.5)

## 2011-10-15 MED ORDER — FENTANYL 2.5 MCG/ML BUPIVACAINE 1/10 % EPIDURAL INFUSION (WH - ANES)
14.0000 mL/h | INTRAMUSCULAR | Status: DC
Start: 2011-10-15 — End: 2011-10-16
  Administered 2011-10-15: 14 mL/h via EPIDURAL
  Filled 2011-10-15 (×2): qty 60

## 2011-10-15 MED ORDER — LACTATED RINGERS IV SOLN: 500.0000 mL | Freq: Once | INTRAVENOUS | Status: DC

## 2011-10-15 MED ORDER — LACTATED RINGERS IV SOLN
500.0000 mL | INTRAVENOUS | Status: DC | PRN
  Administered 2011-10-15: 1000 mL via INTRAVENOUS

## 2011-10-15 MED ORDER — PHENYLEPHRINE 40 MCG/ML (10ML) SYRINGE FOR IV PUSH (FOR BLOOD PRESSURE SUPPORT)
80.0000 ug | PREFILLED_SYRINGE | INTRAVENOUS | Status: DC | PRN
  Filled 2011-10-15: qty 5

## 2011-10-15 MED ORDER — ACETAMINOPHEN 325 MG PO TABS: 650.0000 mg | ORAL_TABLET | ORAL | Status: DC | PRN

## 2011-10-15 MED ORDER — OXYTOCIN BOLUS FROM INFUSION
250.0000 mL | Freq: Once | INTRAVENOUS | Status: DC
  Filled 2011-10-15: qty 500

## 2011-10-15 MED ORDER — PHENYLEPHRINE 40 MCG/ML (10ML) SYRINGE FOR IV PUSH (FOR BLOOD PRESSURE SUPPORT): 80.0000 ug | PREFILLED_SYRINGE | INTRAVENOUS | Status: DC | PRN

## 2011-10-15 MED ORDER — OXYTOCIN 40 UNITS IN LACTATED RINGERS INFUSION - SIMPLE MED
62.5000 mL/h | Freq: Once | INTRAVENOUS | Status: AC
  Administered 2011-10-15: 62.5 mL/h via INTRAVENOUS
  Filled 2011-10-15: qty 1000

## 2011-10-15 MED ORDER — OXYTOCIN 40 UNITS IN LACTATED RINGERS INFUSION - SIMPLE MED
1.0000 m[IU]/min | INTRAVENOUS | Status: DC
  Administered 2011-10-15: 2 m[IU]/min via INTRAVENOUS

## 2011-10-15 MED ORDER — DIPHENHYDRAMINE HCL 50 MG/ML IJ SOLN
12.5000 mg | INTRAMUSCULAR | Status: DC | PRN
Start: 2011-10-15 — End: 2011-10-16

## 2011-10-15 MED ORDER — IBUPROFEN 600 MG PO TABS: 600.0000 mg | ORAL_TABLET | Freq: Four times a day (QID) | ORAL | Status: DC | PRN

## 2011-10-15 MED ORDER — FENTANYL 2.5 MCG/ML BUPIVACAINE 1/10 % EPIDURAL INFUSION (WH - ANES)
INTRAMUSCULAR | Status: DC | PRN
  Administered 2011-10-15: 15 mL/h via EPIDURAL

## 2011-10-15 MED ORDER — LIDOCAINE HCL (PF) 1 % IJ SOLN
INTRAMUSCULAR | Status: DC | PRN
  Administered 2011-10-15: 5 mL
  Administered 2011-10-15: 4 mL

## 2011-10-15 MED ORDER — CITRIC ACID-SODIUM CITRATE 334-500 MG/5ML PO SOLN: 30.0000 mL | ORAL | Status: DC | PRN

## 2011-10-15 MED ORDER — LACTATED RINGERS IV SOLN
INTRAVENOUS | Status: DC
  Administered 2011-10-15 (×2): via INTRAVENOUS

## 2011-10-15 MED ORDER — EPHEDRINE 5 MG/ML INJ
10.0000 mg | INTRAVENOUS | Status: DC | PRN
  Filled 2011-10-15: qty 4

## 2011-10-15 MED ORDER — LIDOCAINE HCL (PF) 1 % IJ SOLN
30.0000 mL | INTRAMUSCULAR | Status: DC | PRN
  Filled 2011-10-15: qty 30

## 2011-10-15 MED ORDER — EPHEDRINE 5 MG/ML INJ: 10.0000 mg | INTRAVENOUS | Status: DC | PRN

## 2011-10-15 NOTE — Progress Notes (Signed)
S: Feeling fine - no pressure or pain     Tolerating contractions since epidural   O:  VS: Blood pressure 94/51, pulse 100, temperature 98.3 F (36.8 C), temperature source Oral, resp. rate 20, height 5\' 9"  (1.753 m), weight 107.049 kg (236 lb), last menstrual period 01/11/2011.        FHR : baseline 145 / variability moderate / accels + / decels -none        Toco: contractions every 3-4 minutes / moderate intensity / MVU adequate        Cervix : 8/90% / vtx / +1 / + show        Membranes: clear fluid  A: active labor     FHR category 1  P: anticipate SVB in next 2-4 hours   [name] CNM, MSN 10/15/2011, 8:46 PM

## 2011-10-15 NOTE — Anesthesia Procedure Notes (Signed)
Epidural Patient location during procedure: OB Start time: 10/15/2011 5:39 PM  Staffing Anesthesiologist: Chaske Paskett A. Performed by: anesthesiologist   Preanesthetic Checklist Completed: patient identified, site marked, surgical consent, pre-op evaluation, timeout performed, IV checked, risks and benefits discussed and monitors and equipment checked  Epidural Patient position: sitting Prep: site prepped and draped and DuraPrep Patient monitoring: continuous pulse ox and blood pressure Approach: midline Injection technique: LOR air  Needle:  Needle type: Tuohy  Needle gauge: 17 G Needle length: 9 cm Needle insertion depth: 9 cm Catheter type: closed end flexible Catheter size: 19 Gauge Catheter at skin depth: 14 cm Test dose: negative and Other  Assessment Events: blood not aspirated, injection not painful, no injection resistance, negative IV test and no paresthesia  Additional Notes Patient identified. Risks and benefits discussed including failed block, incomplete  Pain control, post dural puncture headache, nerve damage, paralysis, blood pressure Changes, nausea, vomiting, reactions to medications-both toxic and allergic and post Partum back pain. All questions were answered. Patient expressed understanding and wished to proceed. Sterile technique was used throughout procedure. Epidural site was Dressed with sterile barrier dressing. No paresthesias, signs of intravascular injection Or signs of intrathecal spread were encountered.  Patient was more comfortable after the epidural was dosed. Please see RN's note for documentation of vital signs and FHR which are stable.

## 2011-10-15 NOTE — Anesthesia Preprocedure Evaluation (Addendum)
Anesthesia Evaluation  Patient identified by MRN, date of birth, ID band Patient awake    Reviewed: Allergy & Precautions, H&P , Patient's Chart, lab work & pertinent test results  Airway Mallampati: III TM Distance: >3 FB Neck ROM: full    Dental No notable dental hx. (+) Teeth Intact   Pulmonary neg pulmonary ROS,  breath sounds clear to auscultation  Pulmonary exam normal       Cardiovascular negative cardio ROS  Rhythm:regular Rate:Normal     Neuro/Psych Anxiety negative neurological ROS     GI/Hepatic negative GI ROS, Neg liver ROS,   Endo/Other  Morbid obesity  Renal/GU negative Renal ROS  negative genitourinary   Musculoskeletal negative musculoskeletal ROS (+)   Abdominal Normal abdominal exam  (+)   Peds  Hematology negative hematology ROS (+)   Anesthesia Other Findings   Reproductive/Obstetrics (+) Pregnancy                           Anesthesia Physical Anesthesia Plan  ASA: III  Anesthesia Plan: Epidural   Post-op Pain Management:    Induction:   Airway Management Planned:   Additional Equipment:   Intra-op Plan:   Post-operative Plan:   Informed Consent: I have reviewed the patients History and Physical, chart, labs and discussed the procedure including the risks, benefits and alternatives for the proposed anesthesia with the patient or authorized representative who has indicated his/her understanding and acceptance.     Plan Discussed with: Anesthesiologist  Anesthesia Plan Comments:        Anesthesia Quick Evaluation

## 2011-10-15 NOTE — Progress Notes (Signed)
S: Feeling rectal pressure     Tolerating contractions well with epidural  O:  VS: Blood pressure 131/94, pulse 99, temperature 98.4 F (36.9 C), temperature source Oral, resp. rate 20, height 5\' 9"  (1.753 m), weight 107.049 kg (236 lb), last menstrual period 01/11/2011.        FHR : baseline 140 / variability moderate / accels + / early decels         Toco: contractions every 2-3 minutes / MVU adequate        Cervix : 9.5/90-100/+2        Membranes: ruptured  A: Active labor     FHR category 1  P: Anticipate SVD in 1-2 hours     Raelyn Mora SNM 10/15/2011, 9:45 PM

## 2011-10-15 NOTE — H&P (Signed)
  OB ADMISSION/ HISTORY & PHYSICAL:  Admission Date: 10/15/2011  3:12 PM  Admit Diagnosis: SROM at 3pm   Linda Fuller is a 21 y.o. female presenting for onset of labor - SROM at 3pm. Contractions every 5 minutes. No bleeding.  Prenatal History: G2P0010   EDC : 10/12/2011, by Other Basis  Prenatal care at [facility]  Prenatal course uncomplicated.  Prenatal Labs: ABO, Rh: A (12/21 0000) Positive Antibody: Negative (12/21 0000) Rubella: Nonimmune (12/21 0000)  RPR: Nonreactive (12/21 0000)  HBsAg: Negative (12/21 0000)  HIV: Non-reactive (12/21 0000)  GBS: Negative (06/14 0000)  1 hr Glucola : normal  G2P0 with hx TAB in 2008 (confidential from family)  Medical / Surgical History :  Past medical history:  Past Medical History  Diagnosis Date  . ANXIETY 01/27/2010  . CONSTIPATION 09/15/2009  . Anemia     Past surgical history: History reviewed. No pertinent past surgical history.   Family History:  Family History  Problem Relation Age of Onset  . Heart disease Mother     Pacemaker   . Colon cancer Neg Hx   . Diabetes Mother     Social History:  reports that she has never smoked. She has never used smokeless tobacco. She reports that she does not drink alcohol or use illicit drugs.  Allergies: Review of patient's allergies indicates no known allergies.   Current Medications at time of admission:  No current facility-administered medications for this encounter.   Review of Systems: SROM - clear at 3pm Mild contractions  Physical Exam: General: anxious / uncomfortable with ctx Heart:RR Lungs:clear Abdomen:gravid / non-tender Extremities:trace edema  Genitalia / VE: Dilation: 5 Effacement (%): 90 Station: 0 Exam by:: t [name] cnm  FHR: baseline 150 / moderate variability / no accels / no decels TOCO: every 5 minutes   Assessment: 40 weeks - SROM / latent labor  Plan:  Admit Epidural Pitocin augmentation as indicated after  epidural  [name] CNM, MSN 10/15/2011, 4:17 PM

## 2011-10-15 NOTE — Progress Notes (Signed)
S: Anxious - fearful of labor / IV start / more pain     Tolerating contractions ok - tense and moaning with ctx   O:  VS: Blood pressure 140/81, pulse 111, temperature 98.3 F (36.8 C), temperature source Oral, resp. rate 20, height 5\' 9"  (1.753 m), weight 107.049 kg (236 lb), last menstrual period 01/11/2011.        FHR : baseline 150 / variability moderate / accels -none / decels -none         Toco: contractions every 5 minutes         Cervix : 5/90/vtx/ 0 / ROT / forewaters (+)        Membranes: clear fluid leakage  / pink discharge  A: SROM - latent labor     FHR category 1  P: Admit     Epidural     Recheck - rupture remaining forewaters after epidural / pitocin augmentation after epidural     [name] CNM, MSN 10/15/2011, 4:24 PM

## 2011-10-15 NOTE — Progress Notes (Signed)
Patient ID: Linda Fuller, female   DOB: 08-29-1990, 21 y.o.   MRN: [id number]  S: Feeling better since epidural     Tolerating contractions well - still anxious about delivery   O:  VS: Blood pressure 127/78, pulse 104, temperature 97.9 F (36.6 C), temperature source Oral, resp. rate 20, height 5\' 9"  (1.753 m), weight 107.049 kg (236 lb), last menstrual period 01/11/2011.        FHR : baseline 145 / variability moderate / accels + / decels none        Toco: contractions not graphing well         Cervix : 6/90/ vtx / 0 station / forewaters AROM - scant clear fluid        Membranes: clear        IUPC placed for assess ctx  A: Active labor     FHR category 1  P: assess ctx activity      Pitocin augmentation      Place foley   [name] CNM, MSN 10/15/2011, 6:22 PM

## 2011-10-16 ENCOUNTER — Encounter (HOSPITAL_COMMUNITY): Payer: Self-pay | Admitting: *Deleted

## 2011-10-16 LAB — RPR: RPR Ser Ql: NONREACTIVE

## 2011-10-16 LAB — CBC
HCT: 33.3 % — ABNORMAL LOW (ref 36.0–46.0)
Hemoglobin: 11 g/dL — ABNORMAL LOW (ref 12.0–15.0)
MCH: 31 pg (ref 26.0–34.0)
MCV: 93.8 fL (ref 78.0–100.0)
RBC: 3.55 MIL/uL — ABNORMAL LOW (ref 3.87–5.11)

## 2011-10-16 MED ORDER — DIPHENHYDRAMINE HCL 25 MG PO CAPS
25.0000 mg | ORAL_CAPSULE | Freq: Four times a day (QID) | ORAL | Status: DC | PRN
  Administered 2011-10-16: 25 mg via ORAL
  Filled 2011-10-16: qty 1

## 2011-10-16 MED ORDER — PRENATAL MULTIVITAMIN CH
1.0000 | ORAL_TABLET | Freq: Every day | ORAL | Status: DC
  Administered 2011-10-16 – 2011-10-17 (×2): 1 via ORAL
  Filled 2011-10-16 (×2): qty 1

## 2011-10-16 MED ORDER — LANOLIN HYDROUS EX OINT: TOPICAL_OINTMENT | CUTANEOUS | Status: DC | PRN

## 2011-10-16 MED ORDER — ONDANSETRON HCL 4 MG PO TABS: 4.0000 mg | ORAL_TABLET | ORAL | Status: DC | PRN

## 2011-10-16 MED ORDER — SIMETHICONE 80 MG PO CHEW: 80.0000 mg | CHEWABLE_TABLET | ORAL | Status: DC | PRN

## 2011-10-16 MED ORDER — TETANUS-DIPHTH-ACELL PERTUSSIS 5-2.5-18.5 LF-MCG/0.5 IM SUSP
0.5000 mL | Freq: Once | INTRAMUSCULAR | Status: AC
  Administered 2011-10-16: 0.5 mL via INTRAMUSCULAR
  Filled 2011-10-16: qty 0.5

## 2011-10-16 MED ORDER — ONDANSETRON HCL 4 MG/2ML IJ SOLN: 4.0000 mg | INTRAMUSCULAR | Status: DC | PRN

## 2011-10-16 MED ORDER — WITCH HAZEL-GLYCERIN EX PADS: 1.0000 "application " | MEDICATED_PAD | CUTANEOUS | Status: DC | PRN

## 2011-10-16 MED ORDER — OXYCODONE-ACETAMINOPHEN 5-325 MG PO TABS
1.0000 | ORAL_TABLET | ORAL | Status: DC | PRN
  Administered 2011-10-16: 1 via ORAL
  Administered 2011-10-17: 2 via ORAL
  Filled 2011-10-16: qty 2
  Filled 2011-10-16 (×2): qty 1

## 2011-10-16 MED ORDER — FERROUS SULFATE 325 (65 FE) MG PO TABS
325.0000 mg | ORAL_TABLET | Freq: Every day | ORAL | Status: DC
  Administered 2011-10-16: 325 mg via ORAL
  Filled 2011-10-16: qty 1

## 2011-10-16 MED ORDER — BENZOCAINE-MENTHOL 20-0.5 % EX AERO
1.0000 "application " | INHALATION_SPRAY | CUTANEOUS | Status: DC | PRN
  Filled 2011-10-16: qty 56

## 2011-10-16 MED ORDER — MEASLES, MUMPS & RUBELLA VAC ~~LOC~~ INJ: 0.5000 mL | INJECTION | Freq: Once | SUBCUTANEOUS | Status: DC

## 2011-10-16 MED ORDER — DIBUCAINE 1 % RE OINT: 1.0000 "application " | TOPICAL_OINTMENT | RECTAL | Status: DC | PRN

## 2011-10-16 MED ORDER — IBUPROFEN 600 MG PO TABS
600.0000 mg | ORAL_TABLET | Freq: Four times a day (QID) | ORAL | Status: DC
  Administered 2011-10-16 – 2011-10-17 (×5): 600 mg via ORAL
  Filled 2011-10-16 (×6): qty 1

## 2011-10-16 MED ORDER — SENNOSIDES-DOCUSATE SODIUM 8.6-50 MG PO TABS: 2.0000 | ORAL_TABLET | Freq: Every day | ORAL | Status: DC

## 2011-10-16 MED ORDER — COMPLETENATE 29-1 MG PO CHEW
1.0000 | CHEWABLE_TABLET | Freq: Every day | ORAL | Status: DC
  Filled 2011-10-16 (×2): qty 1

## 2011-10-16 NOTE — Progress Notes (Signed)
PPD 1 SVD  S:  Reports feeling well - not slept yet / minimal cramps and soreness             Tolerating po/ No nausea or vomiting             Bleeding is light             Pain controlled with motrin             Up ad lib / ambulatory  Newborn breast & bottle feeding - insure if will BF / Circumcision requested - awaiting peds visit   O:  A & O x 3 NAD             VS: Blood pressure 113/74, pulse 93, temperature 98.3 F (36.8 C), temperature source Oral, resp. rate 20, height 5\' 9"  (1.753 m), weight 107.049 kg (236 lb), last menstrual period 01/11/2011, unknown if currently breastfeeding.  LABS: WBC/Hgb/Hct/Plts:  9.5/11.0/33.3/183 (07/21 0535)   Lungs: Clear and unlabored  Heart: regular rate and rhythm   Abdomen: soft, non-tender, non-distended              Fundus: firm, non-tender, U-1  Perineum: mild edema - ice pack in place  Lochia: light  Extremities: trace edema, no calf pain or tenderness    A: PPD # 1   Doing well - stable status  P:  Routine post partum orders  Anticipate d/c tomorrow             Newborn circ today or tomorrow prior to discharge  [name] CNM, MSN 10/16/2011, 9:49 AM

## 2011-10-16 NOTE — Progress Notes (Signed)
Patient complains of rash on left arm elbow. I asked patient to wash arm with soap and warm water. Patient given lotion and will continue to monitor.

## 2011-10-16 NOTE — Anesthesia Postprocedure Evaluation (Signed)
  Anesthesia Post-op Note  Patient: Linda Fuller  Procedure(s) Performed: * No procedures listed *  Patient Location: Mother/Baby  Anesthesia Type: Epidural  Level of Consciousness: awake, alert  and oriented  Airway and Oxygen Therapy: Patient Spontanous Breathing  Post-op Pain: none  Post-op Assessment: Patient's Cardiovascular Status Stable and Respiratory Function Stable  Post-op Vital Signs: stable  Complications: No apparent anesthesia complications

## 2011-10-16 NOTE — Progress Notes (Signed)
CSW spoke with MOB in room.  MOB reports no concerns with anxiety and that this was a hx from a few years ago.    Patient was referred for history of depression/anxiety. * Referral screened out by Clinical Social Worker because none of the following criteria appear to apply: ~ History of anxiety/depression during this pregnancy, or of post-partum depression. ~ Diagnosis of anxiety and/or depression within last 3 years ~ History of depression due to pregnancy loss/loss of child OR * Patient's symptoms currently being treated with medication and/or therapy. Please contact the Clinical Social Worker if needs arise, or by the patient's request.

## 2011-10-17 MED ORDER — IBUPROFEN 600 MG PO TABS: 600.0000 mg | ORAL_TABLET | Freq: Four times a day (QID) | ORAL | Status: AC

## 2011-10-17 MED ORDER — MEASLES, MUMPS & RUBELLA VAC ~~LOC~~ INJ
0.5000 mL | INJECTION | Freq: Once | SUBCUTANEOUS | Status: AC
  Administered 2011-10-17: 0.5 mL via SUBCUTANEOUS
  Filled 2011-10-17: qty 0.5

## 2011-10-17 MED ORDER — OXYCODONE-ACETAMINOPHEN 5-325 MG PO TABS: 1.0000 | ORAL_TABLET | ORAL | Status: AC | PRN

## 2011-10-17 NOTE — Progress Notes (Signed)
Patient ID: Linda Fuller, female   DOB: February 08, 1991, 21 y.o.   MRN: [id number]  PPD 2 SVD  S:  Reports feeling well             Tolerating po/ No nausea or vomiting             Bleeding is light             Pain controlled with motrin and percocet             Up ad lib / ambulatory  Newborn breast and bottle feeding  / Circumcision planned today   O:  A & O x 3              VS: Blood pressure 109/72, pulse 88, temperature 97.5 F (36.4 C), temperature source Oral, resp. rate 20, height 5\' 9"  (1.753 m), weight 107.049 kg (236 lb), last menstrual period 01/11/2011, unknown if currently breastfeeding.    Abdomen: soft, non-tender, non-distended              Fundus: firm, non-tender, U-2  Perineum: no edema  Lochia: light  Extremities: no edema, no calf pain or tenderness    A: PPD # 2   Doing well - stable status  P:  Routine post partum orders  Discharge to home  [name] CNM, MSN 10/17/2011, 9:11 AM

## 2011-10-17 NOTE — Discharge Summary (Signed)
Obstetric Discharge Summary   Reason for Admission: onset of labor Prenatal Procedures: none Intrapartum Procedures: spontaneous vaginal delivery / epidural Postpartum Procedures: none Complications-Operative and Postpartum: none Hemoglobin  Date Value Range Status  10/16/2011 11.0* 12.0 - 15.0 g/dL Final     HCT  Date Value Range Status  10/16/2011 33.3* 36.0 - 46.0 % Final    Physical Exam:  General: alert, cooperative and no distress Lochia: appropriate Uterine Fundus: firm Incision: healing well DVT Evaluation: No evidence of DVT seen on physical exam.  Discharge Diagnoses: Term Pregnancy-delivered  Discharge Information: Date: 10/17/2011 Activity: pelvic rest Diet: routine Medications: PNV, Ibuprofen and Percocet Condition: stable Instructions: refer to practice specific booklet Discharge to: home Follow-up Information    Follow up with [name], CNM. Schedule an appointment as soon as possible for a visit in 6 weeks.   Contact information:   [address] [place] [address] [phone]          Newborn Data: Live born female  Birth Weight: 8 lb 4 oz (3742 g) APGAR: 9, 9  Home with mother.  [name] 10/17/2011, 9:14 AM

## 2011-10-21 ENCOUNTER — Inpatient Hospital Stay (HOSPITAL_COMMUNITY)
Admission: AD | Admit: 2011-10-21 | Discharge: 2011-10-21 | Disposition: A | Discharge status: Home / Self Care | Payer: No Typology Code available for payment source | Source: Ambulatory Visit | Attending: Obstetrics and Gynecology | Admitting: Obstetrics and Gynecology

## 2011-10-21 DIAGNOSIS — O99893 Other specified diseases and conditions complicating puerperium: Secondary | ICD-10-CM | POA: Insufficient documentation

## 2011-10-21 DIAGNOSIS — R300 Dysuria: Secondary | ICD-10-CM | POA: Insufficient documentation

## 2011-10-21 LAB — URINALYSIS, ROUTINE W REFLEX MICROSCOPIC
Bilirubin Urine: NEGATIVE
Glucose, UA: NEGATIVE mg/dL
Ketones, ur: NEGATIVE mg/dL
pH: 6 (ref 5.0–8.0)

## 2011-10-21 LAB — URINE MICROSCOPIC-ADD ON

## 2011-10-21 MED ORDER — NITROFURANTOIN MONOHYD MACRO 100 MG PO CAPS: 100.0000 mg | ORAL_CAPSULE | Freq: Two times a day (BID) | ORAL | Status: AC

## 2011-10-21 MED ORDER — URIBEL 118 MG PO CAPS: 1.0000 | ORAL_CAPSULE | Freq: Four times a day (QID) | ORAL | Status: DC | PRN

## 2011-10-21 MED ORDER — PHENAZOPYRIDINE HCL 100 MG PO TABS
200.0000 mg | ORAL_TABLET | Freq: Three times a day (TID) | ORAL | Status: DC
  Administered 2011-10-21: 200 mg via ORAL
  Filled 2011-10-21: qty 2

## 2011-10-21 MED ORDER — NITROFURANTOIN MONOHYD MACRO 100 MG PO CAPS
100.0000 mg | ORAL_CAPSULE | Freq: Once | ORAL | Status: AC
  Administered 2011-10-21: 100 mg via ORAL
  Filled 2011-10-21: qty 1

## 2011-10-21 NOTE — MAU Note (Signed)
I delivered vaginally last Sat. and I tore. I'm really sore down there. Also, I hurt when I pee and I have had a lot of utis before. Want something to help my milk dry up. I don't want to breast feed anymore

## 2011-10-21 NOTE — MAU Provider Note (Signed)
History   Linda Fuller is a 21 y.o. female G3P1021 6 days PP, SVD under epidural 10/15/11. She had a Foley Cath placed during labor.  C/O increasing burning with urination since this AM, unrelieved by sitz baths and Motrin / Percocet. Perineal lac repaired, small 1st degree.   CSN: [id number]  Arrival date and time: 10/21/11 2012   None     Chief Complaint  Patient presents with  . Dysuria  . Vaginal Pain   HPI  OB History    Grav Para Term Preterm Abortions TAB SAB Ect Mult Living   3 1 1  0 1 1 0 0 0 1      Past Medical History  Diagnosis Date  . ANXIETY 01/27/2010  . CONSTIPATION 09/15/2009  . Anemia     No past surgical history on file.  Family History  Problem Relation Age of Onset  . Heart disease Mother     Pacemaker   . Diabetes Mother   . Colon cancer Neg Hx   . Stroke Maternal Grandmother   . Stroke Maternal Grandfather     History  Substance Use Topics  . Smoking status: Never Smoker   . Smokeless tobacco: Never Used  . Alcohol Use: No    Allergies: No Known Allergies  Prescriptions prior to admission  Medication Sig Dispense Refill  . ferrous sulfate 325 (65 FE) MG tablet Take 325 mg by mouth daily with breakfast.      . ibuprofen (ADVIL,MOTRIN) 600 MG tablet Take 1 tablet (600 mg total) by mouth every 6 (six) hours.  30 tablet  0  . oxyCODONE-acetaminophen (PERCOCET/ROXICET) 5-325 MG per tablet Take 1 tablet by mouth every 4 (four) hours as needed (moderate - severe pain).  20 tablet  0  . prenatal vitamin w/FE, FA (NATACHEW) 29-1 MG CHEW Chew 1 tablet by mouth daily.          ROS + frequency, dysuria Denies fever / chills / flank pain / suprapubic pain Small vaginal bleed  Physical Exam   Blood pressure 136/82, pulse 74, temperature 98.9 F (37.2 C), temperature source Oral, resp. rate 18, height 5\' 9"  (1.753 m), weight 103.148 kg (227 lb 6.4 oz), last menstrual period 01/11/2011.  Physical Exam AAO x3, NAD Abd palp soft, NT Uterus  above SP, firm Perineal lac repair intact No edema  MAU Course  Procedures Urine cath collection  Results for orders placed during the hospital encounter of 10/21/11 (from the past 72 hour(s))  URINALYSIS, ROUTINE W REFLEX MICROSCOPIC     Status: Abnormal   Collection Time   10/21/11  9:26 PM      Component Value Range Comment   Color, Urine YELLOW  YELLOW    APPearance CLEAR  CLEAR    Specific Gravity, Urine 1.020  1.005 - 1.030    pH 6.0  5.0 - 8.0    Glucose, UA NEGATIVE  NEGATIVE mg/dL    Hgb urine dipstick NEGATIVE  NEGATIVE    Bilirubin Urine NEGATIVE  NEGATIVE    Ketones, ur NEGATIVE  NEGATIVE mg/dL    Protein, ur NEGATIVE  NEGATIVE mg/dL    Urobilinogen, UA 1.0  0.0 - 1.0 mg/dL    Nitrite NEGATIVE  NEGATIVE    Leukocytes, UA TRACE (*) NEGATIVE   URINE MICROSCOPIC-ADD ON     Status: Abnormal   Collection Time   10/21/11  9:26 PM      Component Value Range Comment   Squamous Epithelial / LPF FEW (*)  RARE    WBC, UA 3-6  <3 WBC/hpf    RBC / HPF 3-6  <3 RBC/hpf    Bacteria, UA FEW (*) RARE     Assessment and Plan  6 days PP, stable Dysuria  Will treat for presumptive UTI given recent hx of indwelling catheter Macrobid 100 mg PO BID x 7 days and Uribel 1 cap q 6 hrs PRN given UCX pending Advised Push PO fluids DC home F/U w/ Renae Fickle, CNM in office as scheduled or sooner as needed.  PAUL,DANIELA 10/21/2011, 9:46 PM

## 2011-10-21 NOTE — MAU Note (Signed)
Pt aware cath u/a ordered but pt refuses. States " I feel like that's where the soreness is from where I had the cath before".

## 2011-10-21 NOTE — MAU Note (Signed)
Pt with c/o vaginal pain and burning to vagina.  Pt states she thinks she may have a UTI from her catheter placement with delivery.  Pt is 10days PP.

## 2011-10-23 LAB — URINE CULTURE: Colony Count: NO GROWTH

## 2012-06-12 ENCOUNTER — Encounter: Payer: Self-pay | Admitting: Internal Medicine

## 2012-06-12 ENCOUNTER — Other Ambulatory Visit: Payer: No Typology Code available for payment source

## 2012-06-12 ENCOUNTER — Ambulatory Visit (INDEPENDENT_AMBULATORY_CARE_PROVIDER_SITE_OTHER): Payer: No Typology Code available for payment source | Admitting: Internal Medicine

## 2012-06-12 VITALS — BP 102/70 | HR 81 | Temp 98.0°F | Ht 69.0 in | Wt 225.5 lb

## 2012-06-12 DIAGNOSIS — R300 Dysuria: Secondary | ICD-10-CM | POA: Insufficient documentation

## 2012-06-12 DIAGNOSIS — F411 Generalized anxiety disorder: Secondary | ICD-10-CM

## 2012-06-12 DIAGNOSIS — A64 Unspecified sexually transmitted disease: Secondary | ICD-10-CM

## 2012-06-12 DIAGNOSIS — A6000 Herpesviral infection of urogenital system, unspecified: Secondary | ICD-10-CM | POA: Insufficient documentation

## 2012-06-12 LAB — POCT URINALYSIS DIPSTICK
Bilirubin, UA: NEGATIVE
Blood, UA: NEGATIVE
Glucose, UA: NEGATIVE
pH, UA: 5

## 2012-06-12 MED ORDER — CEPHALEXIN 500 MG PO CAPS: 500.0000 mg | ORAL_CAPSULE | Freq: Four times a day (QID) | ORAL | Status: DC

## 2012-06-12 NOTE — Assessment & Plan Note (Addendum)
Asympt, declines pelvic exam but would like STD eval, will order; asked pt to inquire per GYN about prn antibx post interocourse since this appears to be the pattern

## 2012-06-12 NOTE — Assessment & Plan Note (Signed)
UA dip neg, but seems typical ? Urethritis vs cystitis; for antibx, check urine cx

## 2012-06-12 NOTE — Progress Notes (Signed)
Subjective:    Patient ID: Linda Fuller, female    DOB: 1990/04/15, 22 y.o.   MRN: [id number]  HPI here with c/o malodor and dysuria for 2-3 days, with low grade temp, general weakness and malaise, but Denies urinary symptoms such as frequency, urgency, flank pain, hematuria or n/v, chills.  Pain occurs mostly during urination, but sometimes inbetween. Has current odor.  Last check for STD about 8 mo ago, would like re-check.  No unusual vaginal d/c, has hx of tx for bact vaginitis mult times with flagyl, often after menses.  Uses latex condoms, no other BCP.  Has 1 sexual partner, still with the father of her child, hoping to be married eventually, monogamous as far as she knows, and he has not c/o any symptoms   Has hx of UTI fairly freq after intercourse and with menses in the past prior to pregnancy G1P1A0.  Has been tx mult times for UTI per GYN, and pt has tried OTC preps summer's eve, vagisil pH balance, and changed to dove soap; no recent other chemical or douche. Denies worsening depressive symptoms, suicidal ideation, or panic Past Medical History  Diagnosis Date  . ANXIETY 01/27/2010  . CONSTIPATION 09/15/2009  . Anemia    No past surgical history on file.  reports that she has never smoked. She has never used smokeless tobacco. She reports that she does not drink alcohol or use illicit drugs. family history includes Diabetes in her mother; Heart disease in her mother; and Stroke in her maternal grandfather and maternal grandmother.  There is no history of Colon cancer. No Known Allergies Current Outpatient Prescriptions on File Prior to Visit  Medication Sig Dispense Refill  . ferrous sulfate 325 (65 FE) MG tablet Take 325 mg by mouth daily with breakfast.      . Meth-Hyo-M Bl-Na Phos-Ph Sal (URIBEL) 118 MG CAPS Take 1 capsule (118 mg total) by mouth 4 (four) times daily as needed.  30 capsule  0  . prenatal vitamin w/FE, FA (NATACHEW) 29-1 MG CHEW Chew 1 tablet by mouth daily.          No current facility-administered medications on file prior to visit.    Review of Systems  Constitutional: Negative for unexpected weight change, or unusual diaphoresis  HENT: Negative for tinnitus.   Eyes: Negative for photophobia and visual disturbance.  Respiratory: Negative for choking and stridor.   Gastrointestinal: Negative for vomiting and blood in stool.  Genitourinary: Negative for hematuria and decreased urine volume.  Musculoskeletal: Negative for acute joint swelling Skin: Negative for color change and wound.  Neurological: Negative for tremors and numbness other than noted  Psychiatric/Behavioral: Negative for decreased concentration or  hyperactivity.       Objective:   Physical Exam BP 102/70  Pulse 81  Temp(Src) 98 F (36.7 C) (Oral)  Ht 5\' 9"  (1.753 m)  Wt 225 lb 8 oz (102.286 kg)  BMI 33.29 kg/m2  SpO2 99% VS noted,  Constitutional: Pt appears well-developed and well-nourished.  HENT: Head: NCAT.  Right Ear: External ear normal.  Left Ear: External ear normal.  Eyes: Conjunctivae and EOM are normal. Pupils are equal, round, and reactive to light.  Neck: Normal range of motion. Neck supple.  Cardiovascular: Normal rate and regular rhythm.   Pulmonary/Chest: Effort normal and breath sounds normal.  Abd:  Soft,  non-distended, + BS but has mild low mid abd tender, no guarding or rebound, no flank tender Neurological: Pt is alert. Not confused  Skin: Skin is warm. No erythema.  Pelvic: deferred per pt Psychiatric: Pt behavior is normal. Thought content normal. 1+nervous    Assessment & Plan:

## 2012-06-12 NOTE — Patient Instructions (Signed)
Please take all new medication as prescribed - the antibiotic Please continue all other medications as before Your urine specimen will be sent for culture Please go to the LAB in the Basement (turn left off the elevator) for the tests to be done today You will be contacted by phone if any changes need to be made immediately.  Otherwise, you will receive a letter about your results with an explanation, but please check with MyChart first. Please remember to sign up for My Chart if you have not done so, as this will be important to you in the future with finding out test results, communicating by private email, and scheduling acute appointments online when needed.

## 2012-06-12 NOTE — Assessment & Plan Note (Signed)
stable overall by history and exam, recent data reviewed with pt, and pt to continue medical treatment as before,  to f/u any worsening symptoms or concerns Lab Results  Component Value Date   WBC 9.5 10/16/2011   HGB 11.0* 10/16/2011   HCT 33.3* 10/16/2011   PLT 183 10/16/2011   GLUCOSE 81 09/14/2010   ALT 18 09/14/2010   AST 17 09/14/2010   NA 140 09/14/2010   K 5.0 09/14/2010   CL 107 09/14/2010   CREATININE 0.8 09/14/2010   BUN 13 09/14/2010   CO2 28 09/14/2010   TSH 0.71 09/14/2010    

## 2012-06-13 LAB — HEPATITIS PANEL, ACUTE: HCV Ab: NEGATIVE

## 2012-06-13 LAB — RPR

## 2012-06-14 ENCOUNTER — Encounter: Payer: Self-pay | Admitting: Internal Medicine

## 2012-06-14 LAB — URINE CULTURE: Colony Count: 30000

## 2012-06-15 ENCOUNTER — Encounter: Payer: Self-pay | Admitting: Internal Medicine

## 2012-06-15 ENCOUNTER — Ambulatory Visit (INDEPENDENT_AMBULATORY_CARE_PROVIDER_SITE_OTHER): Payer: No Typology Code available for payment source | Admitting: Internal Medicine

## 2012-06-15 VITALS — BP 104/82 | HR 73 | Temp 97.5°F | Ht 69.0 in | Wt 223.0 lb

## 2012-06-15 DIAGNOSIS — R300 Dysuria: Secondary | ICD-10-CM

## 2012-06-15 DIAGNOSIS — F411 Generalized anxiety disorder: Secondary | ICD-10-CM

## 2012-06-15 DIAGNOSIS — A6000 Herpesviral infection of urogenital system, unspecified: Secondary | ICD-10-CM

## 2012-06-15 MED ORDER — VALACYCLOVIR HCL 500 MG PO TABS: 500.0000 mg | ORAL_TABLET | Freq: Every day | ORAL | Status: DC

## 2012-06-15 MED ORDER — CIPROFLOXACIN HCL 500 MG PO TABS: 500.0000 mg | ORAL_TABLET | Freq: Two times a day (BID) | ORAL | Status: DC

## 2012-06-15 NOTE — Assessment & Plan Note (Signed)
D/w pt, partner now having evaluation, cannot tell from + serology when infection may have occurred, for suppressive tx,  to f/u any worsening symptoms or concerns

## 2012-06-15 NOTE — Assessment & Plan Note (Signed)
stable overall by history and exam, recent data reviewed with pt, and pt to continue medical treatment as before,  to f/u any worsening symptoms or concerns Lab Results  Component Value Date   WBC 9.5 10/16/2011   HGB 11.0* 10/16/2011   HCT 33.3* 10/16/2011   PLT 183 10/16/2011   GLUCOSE 81 09/14/2010   ALT 18 09/14/2010   AST 17 09/14/2010   NA 140 09/14/2010   K 5.0 09/14/2010   CL 107 09/14/2010   CREATININE 0.8 09/14/2010   BUN 13 09/14/2010   CO2 28 09/14/2010   TSH 0.71 09/14/2010    

## 2012-06-15 NOTE — Assessment & Plan Note (Signed)
Urine cx with staph coag neg, resist to keflex, to change to cipro course

## 2012-06-15 NOTE — Progress Notes (Signed)
  Subjective:    Patient ID: [name], female    DOB: [birth year], 22 y.o.   MRN: [id number]  HPI  Here to f/u, unfort stilll with mild dysuria and freq with the cephalexin use.  Denies other urinary symptoms such as urgency, flank pain, hematuria or n/v, fever, chills. 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  Father or her child with whom she is still with is being currently evaluated per pt.  She denies other symptoms of pain or rash. Denies worsening depressive symptoms, suicidal ideation, or panic; has ongoing anxiety Past Medical History  Diagnosis Date  . ANXIETY 01/27/2010  . CONSTIPATION 09/15/2009  . Anemia    No past surgical history on file.  reports that she has never smoked. She has never used smokeless tobacco. She reports that she does not drink alcohol or use illicit drugs. family history includes Diabetes in her mother; Heart disease in her mother; and Stroke in her maternal grandfather and maternal grandmother.  There is no history of Colon cancer. No Known Allergies Current Outpatient Prescriptions on File Prior to Visit  Medication Sig Dispense Refill  . ferrous sulfate 325 (65 FE) MG tablet Take 325 mg by mouth daily with breakfast.      . Meth-Hyo-M Bl-Na Phos-Ph Sal (URIBEL) 118 MG CAPS Take 1 capsule (118 mg total) by mouth 4 (four) times daily as needed.  30 capsule  0  . prenatal vitamin w/FE, FA (NATACHEW) 29-1 MG CHEW Chew 1 tablet by mouth daily.         No current facility-administered medications on file prior to visit.   Review of Systems  Constitutional: Negative for unexpected weight change, or unusual diaphoresis  HENT: Negative for tinnitus.   Eyes: Negative for photophobia and visual disturbance.  Respiratory: Negative for choking and stridor.    Gastrointestinal: Negative for vomiting and blood in stool.  Genitourinary: Negative for hematuria and decreased urine volume.  Musculoskeletal: Negative for acute joint swelling Skin: Negative for color change and wound.  Neurological: Negative for tremors and numbness other than noted  Psychiatric/Behavioral: Negative for decreased concentration or  hyperactivity.       Objective:   Physical Exam BP 104/82  Pulse 73  Temp(Src) 97.5 F (36.4 C) (Oral)  Ht 5\' 9"  (1.753 m)  Wt 223 lb (101.152 kg)  BMI 32.92 kg/m2  SpO2 99% VS noted,  Constitutional: Pt appears well-developed and well-nourished.  HENT: Head: NCAT.  Right Ear: External ear normal.  Left Ear: External ear normal.  Eyes: Conjunctivae and EOM are normal. Pupils are equal, round, and reactive to light.  Neck: Normal range of motion. Neck supple.  Cardiovascular: Normal rate and regular rhythm.   Pulmonary/Chest: Effort normal and breath sounds normal.  Neurological: Pt is alert. Not confused  Skin: Skin is warm. No erythema.  Psychiatric: Pt behavior is normal. Thought content normal. 2+ nervous, some tearful    Assessment & Plan:

## 2012-06-15 NOTE — Patient Instructions (Addendum)
Please take all new medication as prescribed Please continue all other medications as before, and refills have been done if requested. Thank you for enrolling in MyChart. Please follow the instructions below to securely access your online medical record. MyChart allows you to send messages to your doctor, view your test results, renew your prescriptions, schedule appointments, and more. To Log into My Chart online, please go by [employer] or [employer] to [email or web address], or download the MyChart App from the [employer] of [employer] .  Your Username is: [email or web address] (pass [email or web address])

## 2012-08-14 ENCOUNTER — Other Ambulatory Visit: Payer: Self-pay | Admitting: Internal Medicine

## 2012-08-14 NOTE — Telephone Encounter (Signed)
Patient informed. 

## 2012-08-14 NOTE — Telephone Encounter (Signed)
cipro declined  Needs OV - ? With [employer]

## 2012-08-14 NOTE — Telephone Encounter (Signed)
No, needs OV

## 2012-08-14 NOTE — Telephone Encounter (Signed)
Pt made an appt with Dr. [name] for Friday May 23.  Declined earlier appt. Due to work schedule.

## 2012-08-17 ENCOUNTER — Ambulatory Visit (INDEPENDENT_AMBULATORY_CARE_PROVIDER_SITE_OTHER): Payer: No Typology Code available for payment source | Admitting: Internal Medicine

## 2012-08-17 ENCOUNTER — Encounter: Payer: Self-pay | Admitting: Internal Medicine

## 2012-08-17 VITALS — BP 110/70 | HR 62 | Temp 97.2°F | Ht 69.0 in | Wt 223.0 lb

## 2012-08-17 DIAGNOSIS — R300 Dysuria: Secondary | ICD-10-CM

## 2012-08-17 DIAGNOSIS — F411 Generalized anxiety disorder: Secondary | ICD-10-CM

## 2012-08-17 DIAGNOSIS — A6000 Herpesviral infection of urogenital system, unspecified: Secondary | ICD-10-CM

## 2012-08-17 LAB — POCT URINALYSIS DIPSTICK
Ketones, UA: NEGATIVE
Leukocytes, UA: NEGATIVE
Nitrite, UA: NEGATIVE
Urobilinogen, UA: NEGATIVE
pH, UA: 7

## 2012-08-17 NOTE — Assessment & Plan Note (Signed)
No recent outbreaks, she is celibate for now, but I reinforced idea of safe sex

## 2012-08-17 NOTE — Progress Notes (Signed)
  Subjective:    Patient ID: Linda Fuller, female    DOB: 01/07/1991, 22 y.o.   MRN: [id number]  HPI  Here after episode of dysuria yesterday after menses completed, but today irriation symtpoms now resolved and Denies urinary symptoms such as dysuria, frequency, urgency, flank pain, hematuria or n/v, fever, chills.  No recent herpes outbreaks though has had some tingling, just no rash.  Denies worsening depressive symptoms, suicidal ideation, or panic; has ongoing anxiety, not increased recently.  Past Medical History  Diagnosis Date  . ANXIETY 01/27/2010  . CONSTIPATION 09/15/2009  . Anemia    No past surgical history on file.  reports that she has never smoked. She has never used smokeless tobacco. She reports that she does not drink alcohol or use illicit drugs. family history includes Diabetes in her mother; Heart disease in her mother; and Stroke in her maternal grandfather and maternal grandmother.  There is no history of Colon cancer. No Known Allergies Current Outpatient Prescriptions on File Prior to Visit  Medication Sig Dispense Refill  . ferrous sulfate 325 (65 FE) MG tablet Take 325 mg by mouth daily with breakfast.      . Meth-Hyo-M Bl-Na Phos-Ph Sal (URIBEL) 118 MG CAPS Take 1 capsule (118 mg total) by mouth 4 (four) times daily as needed.  30 capsule  0  . prenatal vitamin w/FE, FA (NATACHEW) 29-1 MG CHEW Chew 1 tablet by mouth daily.        . valACYclovir (VALTREX) 500 MG tablet Take 1 tablet (500 mg total) by mouth daily.  90 tablet  3   No current facility-administered medications on file prior to visit.     Review of Systems  Constitutional: Negative for unexpected weight change, or unusual diaphoresis  HENT: Negative for tinnitus.   Eyes: Negative for photophobia and visual disturbance.  Respiratory: Negative for choking and stridor.   Gastrointestinal: Negative for vomiting and blood in stool.  Genitourinary: Negative for hematuria and decreased urine volume.   Musculoskeletal: Negative for acute joint swelling Skin: Negative for color change and wound.  Neurological: Negative for tremors and numbness other than noted  Psychiatric/Behavioral: Negative for decreased concentration or  hyperactivity.       Objective:   Physical Exam BP 110/70  Pulse 62  Temp(Src) 97.2 F (36.2 C) (Oral)  Ht 5\' 9"  (1.753 m)  Wt 223 lb (101.152 kg)  BMI 32.92 kg/m2  SpO2 99% VS noted, not ill appearing Constitutional: Pt appears well-developed and well-nourished.  HENT: Head: NCAT.  Right Ear: External ear normal.  Left Ear: External ear normal.  Eyes: Conjunctivae and EOM are normal. Pupils are equal, round, and reactive to light.  Neck: Normal range of motion. Neck supple.  Cardiovascular: Normal rate and regular rhythm.   Pulmonary/Chest: Effort normal and breath sounds normal.  Abd:  Soft, NT, non-distended, + BS, no flank tender Neurological: Pt is alert. Not confused  Skin: Skin is warm. No erythema.  Psychiatric: Pt behavior is normal. Thought content normal.     Assessment & Plan:

## 2012-08-17 NOTE — Patient Instructions (Signed)
OK to continue to watch for any worsening symptoms Please continue all other medications as before

## 2012-08-17 NOTE — Assessment & Plan Note (Signed)
stable overall by history and exam, recent data reviewed with pt, and pt to continue medical treatment as before,  to f/u any worsening symptoms or concerns Lab Results  Component Value Date   WBC 9.5 10/16/2011   HGB 11.0* 10/16/2011   HCT 33.3* 10/16/2011   PLT 183 10/16/2011   GLUCOSE 81 09/14/2010   ALT 18 09/14/2010   AST 17 09/14/2010   NA 140 09/14/2010   K 5.0 09/14/2010   CL 107 09/14/2010   CREATININE 0.8 09/14/2010   BUN 13 09/14/2010   CO2 28 09/14/2010   TSH 0.71 09/14/2010

## 2012-08-17 NOTE — Assessment & Plan Note (Signed)
Transient yesterday, UA dip today neg, exam benign, afeb, doubt recurrent UTI at this time, pt reassured, to f/u any worsening s/s

## 2012-11-15 ENCOUNTER — Ambulatory Visit (INDEPENDENT_AMBULATORY_CARE_PROVIDER_SITE_OTHER): Payer: No Typology Code available for payment source | Admitting: Emergency Medicine

## 2012-11-15 VITALS — BP 132/78 | HR 80 | Temp 99.2°F | Resp 18 | Ht 68.0 in | Wt 216.0 lb

## 2012-11-15 DIAGNOSIS — N898 Other specified noninflammatory disorders of vagina: Secondary | ICD-10-CM

## 2012-11-15 DIAGNOSIS — B352 Tinea manuum: Secondary | ICD-10-CM

## 2012-11-15 LAB — POCT WET PREP WITH KOH
KOH Prep POC: NEGATIVE
Yeast Wet Prep HPF POC: NEGATIVE

## 2012-11-15 MED ORDER — TINIDAZOLE 500 MG PO TABS: 2.0000 g | ORAL_TABLET | Freq: Every day | ORAL | Status: DC

## 2012-11-15 MED ORDER — TERBINAFINE HCL 250 MG PO TABS: 250.0000 mg | ORAL_TABLET | Freq: Every day | ORAL | Status: DC

## 2012-11-15 NOTE — Patient Instructions (Addendum)
Ringworm, Nail A fungal infection of the nail (tinea unguium/onychomycosis) is common. It is common as the visible part of the nail is composed of dead cells which have no blood supply to help prevent infection. It occurs because fungi are everywhere and will pick any opportunity to grow on any dead material. Because nails are very slow growing they require up to 2 years of treatment with anti-fungal medications. The entire nail back to the base is infected. This includes approximately  of the nail which you cannot see. If your caregiver has prescribed a medication by mouth, take it every day and as directed. No progress will be seen for at least 6 to 9 months. Do not be disappointed! Because fungi live on dead cells with little or no exposure to blood supply, medication delivery to the infection is slow; thus the cure is slow. It is also why you can observe no progress in the first 6 months. The nail becoming cured is the base of the nail, as it has the blood supply. Topical medication such as creams and ointments are usually not effective. Important in successful treatment of nail fungus is closely following the medication regimen that your doctor prescribes. Sometimes you and your caregiver may elect to speed up this process by surgical removal of all the nails. Even this may still require 6 to 9 months of additional oral medications. See your caregiver as directed. Remember there will be no visible improvement for at least 6 months. See your caregiver sooner if other signs of infection (redness and swelling) develop. Document Released: 03/11/2000 Document Revised: 06/06/2011 Document Reviewed: 05/20/2008 [facility] Patient Information 2014 [place], [place]. Bacterial Vaginosis Bacterial vaginosis (BV) is a vaginal infection where the normal balance of bacteria in the vagina is disrupted. The normal balance is then replaced by an overgrowth of certain bacteria. There are several different kinds of bacteria  that can cause BV. BV is the most common vaginal infection in women of childbearing age. CAUSES   The cause of BV is not fully understood. BV develops when there is an increase or imbalance of harmful bacteria.  Some activities or behaviors can upset the normal balance of bacteria in the vagina and put women at increased risk including:  Having a new sex partner or multiple sex partners.  Douching.  Using an intrauterine device (IUD) for contraception.  It is not clear what role sexual activity plays in the development of BV. However, women that have never had sexual intercourse are rarely infected with BV. Women do not get BV from toilet seats, bedding, swimming pools or from touching objects around them.  SYMPTOMS   Grey vaginal discharge.  A fish-like odor with discharge, especially after sexual intercourse.  Itching or burning of the vagina and vulva.  Burning or pain with urination.  Some women have no signs or symptoms at all. DIAGNOSIS  Your caregiver must examine the vagina for signs of BV. Your caregiver will perform lab tests and look at the sample of vaginal fluid through a microscope. They will look for bacteria and abnormal cells (clue cells), a pH test higher than 4.5, and a positive amine test all associated with BV.  RISKS AND COMPLICATIONS   Pelvic inflammatory disease (PID).  Infections following gynecology surgery.  Developing HIV.  Developing herpes virus. TREATMENT  Sometimes BV will clear up without treatment. However, all women with symptoms of BV should be treated to avoid complications, especially if gynecology surgery is planned. Female partners generally do not  need to be treated. However, BV may spread between female sex partners so treatment is helpful in preventing a recurrence of BV.   BV may be treated with antibiotics. The antibiotics come in either pill or vaginal cream forms. Either can be used with nonpregnant or pregnant women, but the  recommended dosages differ. These antibiotics are not harmful to the baby.  BV can recur after treatment. If this happens, a second round of antibiotics will often be prescribed.  Treatment is important for pregnant women. If not treated, BV can cause a premature delivery, especially for a pregnant woman who had a premature birth in the past. All pregnant women who have symptoms of BV should be checked and treated.  For chronic reoccurrence of BV, treatment with a type of prescribed gel vaginally twice a week is helpful. HOME CARE INSTRUCTIONS   Finish all medication as directed by your caregiver.  Do not have sex until treatment is completed.  Tell your sexual partner that you have a vaginal infection. They should see their caregiver and be treated if they have problems, such as a mild rash or itching.  Practice safe sex. Use condoms. Only have 1 sex partner. PREVENTION  Basic prevention steps can help reduce the risk of upsetting the natural balance of bacteria in the vagina and developing BV:  Do not have sexual intercourse (be abstinent).  Do not douche.  Use all of the medicine prescribed for treatment of BV, even if the signs and symptoms go away.  Tell your sex partner if you have BV. That way, they can be treated, if needed, to prevent reoccurrence. SEEK MEDICAL CARE IF:   Your symptoms are not improving after 3 days of treatment.  You have increased discharge, pain, or fever. MAKE SURE YOU:   Understand these instructions.  Will watch your condition.  Will get help right away if you are not doing well or get worse. FOR MORE INFORMATION  Division of STD Prevention (DSTDP), Centers for Disease Control and Prevention: [employer] American Social Health Association (ASHA): www.ashastd.org  Document Released: 03/14/2005 Document Revised: 06/06/2011 Document Reviewed: 09/04/2008 [facility] Patient Information 2014 [place], [place].

## 2012-11-15 NOTE — Progress Notes (Signed)
[facility] [address], [place] [address] [phone]  Date:  11/15/2012   Name:  Linda Fuller   DOB:  Aug 06, 1990   MRN:  [id number]  PCP:  Oliver Barre, MD    Chief Complaint: Rash, Eczema and Vaginitis   History of Present Illness:  Linda Fuller is a 22 y.o. very pleasant female patient who presents with the following:  Under treatment for BV and on last two flagyl tablets.  Has had frequent infections in past.  Has genital herpes and is concerned that she has diabetes due frequent fungal infections and is concerned that she may have HIV despite lack of risk and prior negative tests.  Says that her BV has not really improved under treatment.  Has nail fungus and outbreak on tips of fingers palmar surface both hands     Patient Active Problem List   Diagnosis Date Noted  . Dysuria 06/12/2012  . Genital herpes 06/12/2012  . Postpartum care following vaginal delivery (7/20) 10/15/2011  . Preventative health care 08/16/2010  . ANXIETY 01/27/2010  . BACK PAIN 10/03/2008    Past Medical History  Diagnosis Date  . ANXIETY 01/27/2010  . CONSTIPATION 09/15/2009  . Anemia     No past surgical history on file.  History  Substance Use Topics  . Smoking status: Never Smoker   . Smokeless tobacco: Never Used  . Alcohol Use: No    Family History  Problem Relation Age of Onset  . Heart disease Mother     Pacemaker   . Diabetes Mother   . Colon cancer Neg Hx   . Stroke Maternal Grandmother   . Stroke Maternal Grandfather     No Known Allergies  Medication list has been reviewed and updated.  Current Outpatient Prescriptions on File Prior to Visit  Medication Sig Dispense Refill  . valACYclovir (VALTREX) 500 MG tablet Take 1 tablet (500 mg total) by mouth daily.  90 tablet  3  . ferrous sulfate 325 (65 FE) MG tablet Take 325 mg by mouth daily with breakfast.      . Meth-Hyo-M Bl-Na Phos-Ph Sal (URIBEL) 118 MG CAPS Take 1 capsule (118 mg total)  by mouth 4 (four) times daily as needed.  30 capsule  0  . prenatal vitamin w/FE, FA (NATACHEW) 29-1 MG CHEW Chew 1 tablet by mouth daily.         No current facility-administered medications on file prior to visit.    Review of Systems:  As per HPI, otherwise negative.    Physical Examination: Filed Vitals:   11/15/12 1735  BP: 132/78  Pulse: 80  Temp: 99.2 F (37.3 C)  Resp: 18   Filed Vitals:   11/15/12 1735  Height: 5\' 8"  (1.727 m)  Weight: 216 lb (97.977 kg)   Body mass index is 32.85 kg/(m^2). Ideal Body Weight: Weight in (lb) to have BMI = 25: 164.1   GEN: WDWN, NAD, Non-toxic, Alert & Oriented x 3 HEENT: Atraumatic, Normocephalic.  Ears and Nose: No external deformity. EXTR: No clubbing/cyanosis/edema NEURO: Normal gait.  PSYCH: Normally interactive. Conversant. Not depressed or anxious appearing.  Calm demeanor.  Hands:  Tinea manum  Assessment and Plan: Tinea manum lamisil Vaginal discharge bv   Signed,  Phillips Odor, MD   Results for orders placed in visit on 11/15/12  POCT WET PREP WITH KOH      Result Value Range   Trichomonas, UA Negative     Clue  Cells Wet Prep HPF POC 5-10     Epithelial Wet Prep HPF POC 10-15     Yeast Wet Prep HPF POC neg     Bacteria Wet Prep HPF POC 4+     RBC Wet Prep HPF POC 0-2     WBC Wet Prep HPF POC 5-10     KOH Prep POC Negative

## 2012-12-04 ENCOUNTER — Ambulatory Visit (INDEPENDENT_AMBULATORY_CARE_PROVIDER_SITE_OTHER): Payer: No Typology Code available for payment source

## 2012-12-04 ENCOUNTER — Other Ambulatory Visit: Payer: Self-pay | Admitting: Internal Medicine

## 2012-12-04 ENCOUNTER — Other Ambulatory Visit: Payer: No Typology Code available for payment source

## 2012-12-04 DIAGNOSIS — Z0000 Encounter for general adult medical examination without abnormal findings: Secondary | ICD-10-CM

## 2012-12-04 DIAGNOSIS — Z111 Encounter for screening for respiratory tuberculosis: Secondary | ICD-10-CM

## 2012-12-05 LAB — VARICELLA ZOSTER ANTIBODY, IGG: Varicella IgG: 836.1 Index — ABNORMAL HIGH (ref <135.00)

## 2012-12-06 ENCOUNTER — Ambulatory Visit (INDEPENDENT_AMBULATORY_CARE_PROVIDER_SITE_OTHER): Payer: No Typology Code available for payment source

## 2012-12-06 DIAGNOSIS — Z23 Encounter for immunization: Secondary | ICD-10-CM

## 2012-12-06 LAB — TB SKIN TEST: TB Skin Test: NEGATIVE

## 2013-01-22 ENCOUNTER — Encounter: Payer: Self-pay | Admitting: Internal Medicine

## 2013-01-22 ENCOUNTER — Ambulatory Visit (INDEPENDENT_AMBULATORY_CARE_PROVIDER_SITE_OTHER): Payer: No Typology Code available for payment source | Admitting: Internal Medicine

## 2013-01-22 VITALS — BP 120/78 | HR 82 | Temp 97.9°F | Ht 68.0 in | Wt 218.1 lb

## 2013-01-22 DIAGNOSIS — K5909 Other constipation: Secondary | ICD-10-CM

## 2013-01-22 DIAGNOSIS — R21 Rash and other nonspecific skin eruption: Secondary | ICD-10-CM

## 2013-01-22 DIAGNOSIS — K921 Melena: Secondary | ICD-10-CM | POA: Insufficient documentation

## 2013-01-22 DIAGNOSIS — K5900 Constipation, unspecified: Secondary | ICD-10-CM

## 2013-01-22 HISTORY — DX: Other constipation: K59.09

## 2013-01-22 MED ORDER — TRIAMCINOLONE ACETONIDE 0.1 % EX CREA: TOPICAL_CREAM | Freq: Two times a day (BID) | CUTANEOUS | Status: DC

## 2013-01-22 MED ORDER — LINACLOTIDE 290 MCG PO CAPS: 290.0000 ug | ORAL_CAPSULE | Freq: Every day | ORAL | Status: DC

## 2013-01-22 NOTE — Assessment & Plan Note (Signed)
?   IBS realted vs other, for linzess asd

## 2013-01-22 NOTE — Patient Instructions (Signed)
Please take all new medication as prescribed - the cream, and the samples of Linzess 290  - 1 per day If this works ok, you also have the written prescription for the linzess  You will be contacted regarding the referral for: Gastroenterology

## 2013-01-22 NOTE — Assessment & Plan Note (Signed)
Small volume intermittent rectal with constipation , for GI referral

## 2013-01-22 NOTE — Progress Notes (Signed)
  Subjective:    Patient ID: Linda Fuller, female    DOB: [birth year], 22 y.o.   MRN: [id number]  HPI  Here with nonpainful itchy rash to mult finger pads, ? Worse handling oily materials at work, has happended before and improved with steroid cream, now back.  Also with ongoing chronic constipation despite mult OTC med trials of laxative and stool softners, just painful every day and assoc with intermittent bleeding. Denies worsening depressive symptoms, suicidal ideation, or panic; has ongoing anxiety, Past Medical History  Diagnosis Date  . ANXIETY 01/27/2010  . CONSTIPATION 09/15/2009  . Anemia    No past surgical history on file.  reports that she has never smoked. She has never used smokeless tobacco. She reports that she does not drink alcohol or use illicit drugs. family history includes Diabetes in her mother; Heart disease in her mother; Stroke in her maternal grandfather and maternal grandmother. There is no history of Colon cancer. No Known Allergies Current Outpatient Prescriptions on File Prior to Visit  Medication Sig Dispense Refill  . ferrous sulfate 325 (65 FE) MG tablet Take 325 mg by mouth daily with breakfast.      . Meth-Hyo-M Bl-Na Phos-Ph Sal (URIBEL) 118 MG CAPS Take 1 capsule (118 mg total) by mouth 4 (four) times daily as needed.  30 capsule  0  . metroNIDAZOLE (FLAGYL) 500 MG tablet Take 1 mg by mouth 2 (two) times daily.      . prenatal vitamin w/FE, FA (NATACHEW) 29-1 MG CHEW Chew 1 tablet by mouth daily.        Marland Kitchen terbinafine (LAMISIL) 250 MG tablet Take 1 tablet (250 mg total) by mouth daily.  14 tablet  0  . tinidazole (TINDAMAX) 500 MG tablet Take 4 tablets (2,000 mg total) by mouth daily with breakfast.  12 tablet  0  . valACYclovir (VALTREX) 500 MG tablet Take 1 tablet (500 mg total) by mouth daily.  90 tablet  3   No current facility-administered medications on file prior to visit.    Review of Systems  Constitutional: Negative for unexpected weight change,  or unusual diaphoresis  HENT: Negative for tinnitus.   Eyes: Negative for photophobia and visual disturbance.  Respiratory: Negative for choking and stridor.   Gastrointestinal: Negative for vomiting and blood in stool.  Genitourinary: Negative for hematuria and decreased urine volume.  Musculoskeletal: Negative for acute joint swelling Skin: Negative for color change and wound.  Neurological: Negative for tremors and numbness other than noted  Psychiatric/Behavioral: Negative for decreased concentration or  hyperactivity.       Objective:   Physical Exam BP 120/78  Pulse 82  Temp(Src) 97.9 F (36.6 C) (Oral)  Ht 5\' 8"  (1.727 m)  Wt 218 lb 2 oz (98.941 kg)  BMI 33.17 kg/m2  SpO2 99% VS noted,  Constitutional: Pt appears well-developed and well-nourished.  HENT: Head: NCAT.  Right Ear: External ear normal.  Left Ear: External ear normal.  Eyes: Conjunctivae and EOM are normal. Pupils are equal, round, and reactive to light.  Neck: Normal range of motion. Neck supple.  Cardiovascular: Normal rate and regular rhythm.   Pulmonary/Chest: Effort normal and breath sounds normal.  Abd:  Soft, NT, non-distended, + BS, no HSM Neurological: Pt is alert. Not confused  Skin: Skin is warm. No erythema. except scaly eczema like rash mult finger pads Psychiatric: Pt behavior is normal. Thought content normal. 1+ nervous    Assessment & Plan:

## 2013-01-22 NOTE — Assessment & Plan Note (Signed)
C/w prob dyshidrotic eczema - for triam cr prn

## 2013-03-04 ENCOUNTER — Encounter: Payer: Self-pay | Admitting: Internal Medicine

## 2013-04-03 ENCOUNTER — Telehealth: Payer: Self-pay | Admitting: *Deleted

## 2013-04-03 NOTE — Telephone Encounter (Signed)
Pt had phoned requesting additional Linzess...according to documentation, her initial script that was started in October had 11 refills.  Returned patient's call & left message with this information & if she needed further assistance to call back.

## 2013-04-09 ENCOUNTER — Encounter: Payer: Self-pay | Admitting: *Deleted

## 2013-04-09 ENCOUNTER — Encounter: Payer: Self-pay | Admitting: Family Medicine

## 2013-04-09 ENCOUNTER — Ambulatory Visit (INDEPENDENT_AMBULATORY_CARE_PROVIDER_SITE_OTHER): Payer: No Typology Code available for payment source | Admitting: Family Medicine

## 2013-04-09 VITALS — BP 122/74 | HR 85 | Temp 97.6°F | Resp 16 | Wt 220.2 lb

## 2013-04-09 DIAGNOSIS — J069 Acute upper respiratory infection, unspecified: Secondary | ICD-10-CM

## 2013-04-09 MED ORDER — FLUTICASONE PROPIONATE 50 MCG/ACT NA SUSP: 1.0000 | Freq: Every day | NASAL | Status: DC

## 2013-04-09 NOTE — Patient Instructions (Signed)
Nice to meet you Try nose spray daily Stay out of work one day Continue other medications we discussed.  Upper Respiratory Infection, Adult An upper respiratory infection (URI) is also known as the common cold. It is often caused by a type of germ (virus). Colds are easily spread (contagious). You can pass it to others by kissing, coughing, sneezing, or drinking out of the same glass. Usually, you get better in 1 or 2 weeks.  HOME CARE   Only take medicine as told by your doctor.  Use a warm mist humidifier or breathe in steam from a hot shower.  Drink enough water and fluids to keep your pee (urine) clear or pale yellow.  Get plenty of rest.  Return to work when your temperature is back to normal or as told by your doctor. You may use a face mask and wash your hands to stop your cold from spreading. GET HELP RIGHT AWAY IF:   After the first few days, you feel you are getting worse.  You have questions about your medicine.  You have chills, shortness of breath, or brown or red spit (mucus).  You have yellow or brown snot (nasal discharge) or pain in the face, especially when you bend forward.  You have a fever, puffy (swollen) neck, pain when you swallow, or white spots in the back of your throat.  You have a bad headache, ear pain, sinus pain, or chest pain.  You have a high-pitched whistling sound when you breathe in and out (wheezing).  You have a lasting cough or cough up blood.  You have sore muscles or a stiff neck. MAKE SURE YOU:   Understand these instructions.  Will watch your condition.  Will get help right away if you are not doing well or get worse. Document Released: 08/31/2007 Document Revised: 06/06/2011 Document Reviewed: 07/19/2010 [employer] [facility] Patient Information 2014 [employer] [place].

## 2013-04-09 NOTE — Progress Notes (Signed)
SUBJECTIVE:  Linda Fuller is a 23 y.o. female who complains of congestion, sneezing, sore throat, swollen glands, nasal blockage, post nasal drip and chills for 3 days. She denies a history of anorexia, chest pain, dizziness, shortness of breath, vomiting, weakness and weight loss and denies a history of asthma. Patient does not smoke cigarettes. History reviewed. No pertinent past surgical history. Past Medical History  Diagnosis Date  . ANXIETY 01/27/2010  . CONSTIPATION 09/15/2009  . Anemia    History  Substance Use Topics  . Smoking status: Never Smoker   . Smokeless tobacco: Never Used  . Alcohol Use: No      OBJECTIVE: Blood pressure 122/74, pulse 85, temperature 97.6 F (36.4 C), temperature source Oral, resp. rate 16, weight 220 lb 3.2 oz (99.882 kg), SpO2 95.00%.  She appears well, vital signs are as noted. Ears normal.  Throat and pharynx mild eryhema.  Neck supple. No adenopathy in the neck. Nose is congested. Sinuses non tender. The chest is clear, without wheezes or rales.  ASSESSMENT:  viral upper respiratory illness  PLAN: Symptomatic therapy suggested: push fluids, rest and return office visit prn if symptoms persist or worsen. Lack of antibiotic effectiveness discussed with her. Flonase per orders. Call or return to clinic prn if these symptoms worsen or fail to improve as anticipated.

## 2013-04-23 ENCOUNTER — Telehealth: Payer: Self-pay | Admitting: Internal Medicine

## 2013-04-23 NOTE — Telephone Encounter (Signed)
Ok for 2 wk sample if avail

## 2013-04-23 NOTE — Telephone Encounter (Signed)
Called the patient left a detailed message Linzess samples are at the front desk.

## 2013-04-23 NOTE — Telephone Encounter (Signed)
Patient states that she is awaiting prior authorization for her rx Linaclotide (LINZESS) 290 MCG CAPS capsule. She is wanting to know if we have any samples that can be provided to her until prior Berkley Harveyauth is completed. Patient called her pharmacy and was told that we had not started the PA process. Please advise.

## 2013-05-02 NOTE — Telephone Encounter (Signed)
Called [facility] for PA approval of Linzess.  Did receive approval for 36 months for Linzess.  [id number].  Called the patient to inform received approval.  Also letter will be faxed to pharmacy and our office as well regarding approval.

## 2013-09-04 ENCOUNTER — Ambulatory Visit (INDEPENDENT_AMBULATORY_CARE_PROVIDER_SITE_OTHER): Payer: No Typology Code available for payment source | Admitting: Internal Medicine

## 2013-09-04 ENCOUNTER — Encounter: Payer: Self-pay | Admitting: Internal Medicine

## 2013-09-04 VITALS — BP 104/68 | HR 80 | Temp 98.3°F | Ht 69.0 in | Wt 227.1 lb

## 2013-09-04 DIAGNOSIS — N949 Unspecified condition associated with female genital organs and menstrual cycle: Secondary | ICD-10-CM

## 2013-09-04 DIAGNOSIS — R21 Rash and other nonspecific skin eruption: Secondary | ICD-10-CM

## 2013-09-04 DIAGNOSIS — J029 Acute pharyngitis, unspecified: Secondary | ICD-10-CM

## 2013-09-04 DIAGNOSIS — K5909 Other constipation: Secondary | ICD-10-CM

## 2013-09-04 DIAGNOSIS — N938 Other specified abnormal uterine and vaginal bleeding: Secondary | ICD-10-CM

## 2013-09-04 DIAGNOSIS — K921 Melena: Secondary | ICD-10-CM

## 2013-09-04 DIAGNOSIS — K5900 Constipation, unspecified: Secondary | ICD-10-CM

## 2013-09-04 DIAGNOSIS — N910 Primary amenorrhea: Secondary | ICD-10-CM

## 2013-09-04 MED ORDER — VALACYCLOVIR HCL 500 MG PO TABS: 500.0000 mg | ORAL_TABLET | Freq: Every day | ORAL | Status: DC

## 2013-09-04 MED ORDER — TRIAMCINOLONE ACETONIDE 0.1 % EX CREA: TOPICAL_CREAM | Freq: Two times a day (BID) | CUTANEOUS | Status: DC

## 2013-09-04 MED ORDER — AZITHROMYCIN 250 MG PO TABS: ORAL_TABLET | ORAL | Status: DC

## 2013-09-04 NOTE — Patient Instructions (Addendum)
Please take all new medication as prescribed - the antibiotic, and the steroid cream  Please continue all other medications as before, including the samples of linzess.  Please have the pharmacy call with any other refills you may need.  Please continue your efforts at being more active, low cholesterol diet, and weight control.  You will be contacted regarding the referral for: Gastroenterology  Please go to the LAB in the Basement (turn left off the elevator) for the tests to be done today - the pregnancy test  You will be contacted by phone if any changes need to be made immediately.  Otherwise, you will receive a letter about your results with an explanation, but please check with MyChart first.  Please remember to sign up for MyChart if you have not done so, as this will be important to you in the future with finding out test results, communicating by private email, and scheduling acute appointments online when needed.

## 2013-09-04 NOTE — Assessment & Plan Note (Signed)
Ok for repeat steroid cr prn,  to f/u any worsening symptoms or concerns

## 2013-09-04 NOTE — Progress Notes (Signed)
Subjective:    Patient ID: Linda Fuller, female    DOB: 10-06-1990, 23 y.o.   MRN: [id number]  HPI  Here with 1 wk low grade  fever, facial pain, pressure, headache, general weakness and malaise, and greenish d/c, with mild ST and cough, but pt denies chest pain, wheezing, increased sob or doe, orthopnea, PND, increased LE swelling, palpitations, dizziness or syncope.  Also with ongoing chronic constipation with abd pain and bloating and pain with BM, with stool softner and laxative otc caused more pain/crampy, Linzess has worked but only gets 8 pills per bottle/month, only takes very occasionally.  Last BM x 3 days, now starting to get uncomfortable again.  Also with recent episode BRBPR painless approx 2 wks ago, asks for GI referral, would like to be able to handle without the linzess.  Also asks for pregnancy test, missed last menses, home test neg yesterday.     Also with worsening all fingertips dry, redness/tender with one finger with cracking skin and pain as well.  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 Past Medical History  Diagnosis Date  . ANXIETY 01/27/2010  . CONSTIPATION 09/15/2009  . Anemia    No past surgical history on file.  reports that she has never smoked. She has never used smokeless tobacco. She reports that she does not drink alcohol or use illicit drugs. family history includes Diabetes in her mother; Heart disease in her mother; Stroke in her maternal grandfather and maternal grandmother. There is no history of Colon cancer. No Known Allergies Current Outpatient Prescriptions on File Prior to Visit  Medication Sig Dispense Refill  . ferrous sulfate 325 (65 FE) MG tablet Take 325 mg by mouth daily with breakfast.      . Linaclotide (LINZESS) 290 MCG CAPS capsule Take 1 capsule (290 mcg total) by mouth daily.  30 capsule  11  . Meth-Hyo-M Bl-Na Phos-Ph Sal (URIBEL) 118 MG CAPS Take 1 capsule (118 mg total) by mouth 4  (four) times daily as needed.  30 capsule  0  . tinidazole (TINDAMAX) 500 MG tablet Take 4 tablets (2,000 mg total) by mouth daily with breakfast.  12 tablet  0   No current facility-administered medications on file prior to visit.    Review of Systems  Constitutional: Negative for unusual diaphoresis or other sweats  HENT: Negative for ringing in ear Eyes: Negative for double vision or worsening visual disturbance.  Respiratory: Negative for choking and stridor.   Gastrointestinal: Negative for vomiting or other signifcant bowel change Genitourinary: Negative for hematuria or decreased urine volume.  Musculoskeletal: Negative for other MSK pain or swelling Skin: Negative for color change and worsening wound.  Neurological: Negative for tremors and numbness other than noted  Psychiatric/Behavioral: Negative for decreased concentration or agitation other than above       Objective:   Physical Exam BP 104/68  Pulse 80  Temp(Src) 98.3 F (36.8 C) (Oral)  Ht 5\' 9"  (1.753 m)  Wt 227 lb 2 oz (103.023 kg)  BMI 33.53 kg/m2  SpO2 99% VS noted, midl ill Constitutional: Pt appears well-developed, well-nourished.  HENT: Head: NCAT.  Right Ear: External ear normal.  Left Ear: External ear normal.  Bilat tm's with mild erythema.  Max sinus areas non tender.  Pharynx with mod erythema, ? Slight exudate Eyes: . Pupils are equal, round, and reactive to light. Conjunctivae and EOM are normal Neck: Normal range of motion. Neck  supple.  Cardiovascular: Normal rate and regular rhythm.   Pulmonary/Chest: Effort normal and breath sounds normal.  Abd: ? Mild distended, firm but nontender, + BS, no HSM  DRE: deferred per pt Neurological: Pt is alert. Not confused , motor grossly intact Skin: Skin is warm. All fingertip pads with dry erythema, minor cracking with tender Psychiatric: Pt behavior is normal. No agitation.     Assessment & Plan:

## 2013-09-04 NOTE — Assessment & Plan Note (Signed)
Some bloating noted on exam, less likely malignant related due to age, but cant r/o IBD, for GI referral as well

## 2013-09-04 NOTE — Assessment & Plan Note (Signed)
Gave several samples linzess, also refer Gi per pt request

## 2013-09-04 NOTE — Progress Notes (Signed)
Pre visit review using our clinic review tool, if applicable. No additional management support is needed unless otherwise documented below in the visit note. 

## 2013-09-04 NOTE — Assessment & Plan Note (Signed)
Mild to mod, for antibx course,  to f/u any worsening symptoms or concerns 

## 2013-09-04 NOTE — Assessment & Plan Note (Signed)
Also for pregnancy test at lab but at end visit, she decides to not do this

## 2013-09-10 ENCOUNTER — Telehealth: Payer: Self-pay | Admitting: Gastroenterology

## 2013-09-10 NOTE — Telephone Encounter (Signed)
Left message on machine to call back  

## 2013-09-12 NOTE — Telephone Encounter (Signed)
Pt has been referred for hematochezia and constipation.  She was put on the schedule for 09/24/13 with Willette ClusterPaula Guenther

## 2013-09-24 ENCOUNTER — Ambulatory Visit (INDEPENDENT_AMBULATORY_CARE_PROVIDER_SITE_OTHER): Payer: Self-pay | Admitting: Nurse Practitioner

## 2013-09-24 ENCOUNTER — Encounter: Payer: Self-pay | Admitting: Nurse Practitioner

## 2013-09-24 VITALS — BP 120/70 | HR 66 | Ht 69.0 in | Wt 224.0 lb

## 2013-09-24 DIAGNOSIS — K5909 Other constipation: Secondary | ICD-10-CM

## 2013-09-24 DIAGNOSIS — K5900 Constipation, unspecified: Secondary | ICD-10-CM

## 2013-09-24 DIAGNOSIS — K921 Melena: Secondary | ICD-10-CM

## 2013-09-24 MED ORDER — NA SULFATE-K SULFATE-MG SULF 17.5-3.13-1.6 GM/177ML PO SOLN: ORAL | Status: DC

## 2013-09-24 NOTE — Progress Notes (Signed)
    HPI :  Patient is a 23 year old female who saw Dr. Christella HartiganJacobs in 2012 for evaluation of constipation. referred for evaluation of constipation and hematochezia. Patient also reported significant weight loss, TSH was in normal range. She has tried Miralax without much benefit. Her diet sounds poor in fiber. Constipation is chronic but worse over the last two years, she has associated intermittent rectal bleeding and abdominal discomfort. Stools infrequent and hard. She started fiber and is drinking more water.  Linzess helps but didn't realize she could take it on daily basis.  Stool softeners caused cramps. No unexpected weight loss, in fact she has gained significant amount of weight.   Past Medical History  Diagnosis Date  . ANXIETY 01/27/2010  . CONSTIPATION 09/15/2009  . Anemia     Family History  Problem Relation Age of Onset  . Heart disease Mother     Pacemaker   . Diabetes Mother   . Colon cancer Neg Hx   . Stroke Maternal Grandmother   . Stroke Maternal Grandfather    History  Substance Use Topics  . Smoking status: Never Smoker   . Smokeless tobacco: Never Used  . Alcohol Use: No   Current Outpatient Prescriptions  Medication Sig Dispense Refill  . Linaclotide (LINZESS) 290 MCG CAPS capsule Take 1 capsule (290 mcg total) by mouth daily.  30 capsule  11  . valACYclovir (VALTREX) 500 MG tablet Take 1 tablet (500 mg total) by mouth daily.  90 tablet  3  . Na Sulfate-K Sulfate-Mg Sulf (SUPREP BOWEL PREP) SOLN USE AS BOWEL PURGE--INSTRUCTIONS GIVEN  1 Bottle  0   No current facility-administered medications for this visit.   No Known Allergies   Review of Systems: All systems reviewed and negative except where noted in HPI.   Physical Exam: BP 120/70  Pulse 66  Ht 5\' 9"  (1.753 m)  Wt 224 lb (101.606 kg)  BMI 33.06 kg/m2 Constitutional: Pleasant, obese black female in no acute distress. HEENT: Normocephalic and atraumatic. Conjunctivae are normal. No scleral  icterus. Neck supple.  Cardiovascular: Normal rate, regular rhythm.  Pulmonary/chest: Effort normal and breath sounds normal. No wheezing, rales or rhonchi. Abdominal: Soft, nondistended, nontender. Bowel sounds active throughout. There are no masses palpable. No hepatomegaly. Rectal: Anal canal snug with ringlike structure. Unable to move finger in direction of posterior wall as anorectal area seemed to be more anterior.  Extremities: no edema Lymphadenopathy: No cervical adenopathy noted. Neurological: Alert and oriented to person place and time. Skin: Skin is warm and dry. No rashes noted. Psychiatric: Normal mood and affect. Behavior is normal.   ASSESSMENT AND PLAN:  23 year old female with chronic constipation associated with intermittent rectal bleeding over last couple of years (since birth of baby). Digital rectal exam a little unusual, see above. Need to proceed with a sigmoidoscopy for further evaluation but findings probably her normal anatomy. In interim I advised patient to increase Linzess to once daily, or at least to one every other day.

## 2013-09-24 NOTE — Patient Instructions (Addendum)
High Fiber Diet below for your review. Increase water intake. Should get a minimun of 8 glasses daily. Sample of Suprep given today to do a bowel purge. Linzess every other day  You have been scheduled for a flexible sigmoidoscopy. Please follow the written instructions given to you at your visit today. If you use inhalers (even only as needed), please bring them with you on the day of your procedure.

## 2013-09-25 NOTE — Progress Notes (Signed)
I agree, [facility]

## 2013-10-25 ENCOUNTER — Telehealth: Payer: Self-pay | Admitting: Gastroenterology

## 2013-10-25 NOTE — Telephone Encounter (Signed)
Returned patient phone call. Patient was given written instructions for Flexible Sigmoidoscopy with magnesium citrate and fleets but given a suprep kit with instructions. Clarified that patient should use magnesium citrate and fleets enema as directed for flexible sigmoidoscopy. Asked her to bring Suprep kit back with her on her scheduled procedure day to be returned to stock.

## 2013-10-28 ENCOUNTER — Encounter: Payer: Self-pay | Admitting: Gastroenterology

## 2013-10-28 ENCOUNTER — Ambulatory Visit (AMBULATORY_SURGERY_CENTER): Payer: No Typology Code available for payment source | Admitting: Gastroenterology

## 2013-10-28 VITALS — BP 121/72 | HR 65 | Temp 98.8°F | Resp 12 | Ht 69.0 in | Wt 224.0 lb

## 2013-10-28 DIAGNOSIS — K5900 Constipation, unspecified: Secondary | ICD-10-CM

## 2013-10-28 MED ORDER — SODIUM CHLORIDE 0.9 % IV SOLN: 500.0000 mL | INTRAVENOUS | Status: DC

## 2013-10-28 NOTE — Op Note (Signed)
[facility] [address] [place], [id number]   FLEXIBLE SIGMOIDOSCOPY PROCEDURE REPORT  PATIENT: [name], Linda L.  MR#: [id number] BIRTHDATE: July 12, 1990 , 23  yrs. old GENDER: Female ENDOSCOPIST: Rachael Feeaniel P Olivene Cookston, MD PROCEDURE DATE:  10/28/2013 PROCEDURE:   Sigmoidoscopy, diagnostic ASA CLASS:   Class I INDICATIONS:chronic constipation. MEDICATIONS: MAC sedation, administered by CRNA and Propofol (Diprivan) 170 mg IV  DESCRIPTION OF PROCEDURE:   After the risks benefits and alternatives of the procedure were thoroughly explained, informed consent was obtained.  revealed no abnormalities of the rectum. The LB [id number]-[id number]  endoscope was introduced through the anus  and advanced to the sigmoid colon , limited by No adverse events experienced.   The quality of the prep was excellent .  The instrument was then slowly withdrawn as the mucosa was fully examined.       COLON FINDINGS: The examination was normal to the splenic flexure. Retroflexed views revealed no abnormalities.    The scope was then withdrawn from the patient and the procedure terminated.  COMPLICATIONS: There were no complications.  ENDOSCOPIC IMPRESSION: The examination was normal to the splenic flexure.  RECOMMENDATIONS: Continue once daily linzess for your chronic constipation.    _______________________________ eSignedRachael Fee:  Nashon Erbes P Leyton Magoon, MD 10/28/2013 1:54 PM   CC: Oliver BarreJames John, MD

## 2013-10-28 NOTE — Progress Notes (Signed)
Procedure ends, to recovery, report given and VSS. 

## 2013-10-28 NOTE — Patient Instructions (Signed)
YOU HAD AN ENDOSCOPIC PROCEDURE TODAY AT THE [facility]: Refer to the procedure report that was given to you for any specific questions about what was found during the examination.  If the procedure report does not answer your questions, please call your gastroenterologist to clarify.  If you requested that your care partner not be given the details of your procedure findings, then the procedure report has been included in a sealed envelope for you to review at your convenience later.  YOU SHOULD EXPECT: Some feelings of bloating in the abdomen. Passage of more gas than usual.  Walking can help get rid of the air that was put into your GI tract during the procedure and reduce the bloating. If you had a lower endoscopy (such as a colonoscopy or flexible sigmoidoscopy) you may notice spotting of blood in your stool or on the toilet paper. If you underwent a bowel prep for your procedure, then you may not have a normal bowel movement for a few days.  DIET: Your first meal following the procedure should be a light meal and then it is ok to progress to your normal diet.  A half-sandwich or bowl of soup is an example of a good first meal.  Heavy or fried foods are harder to digest and may make you feel nauseous or bloated.  Likewise meals heavy in dairy and vegetables can cause extra gas to form and this can also increase the bloating.  Drink plenty of fluids but you should avoid alcoholic beverages for 24 hours.  ACTIVITY: Your care partner should take you home directly after the procedure.  You should plan to take it easy, moving slowly for the rest of the day.  You can resume normal activity the day after the procedure however you should NOT DRIVE or use heavy machinery for 24 hours (because of the sedation medicines used during the test).    SYMPTOMS TO REPORT IMMEDIATELY: A gastroenterologist can be reached at any hour.  During normal business hours, 8:30 AM to 5:00 PM Monday through Friday,  call [phone].  After hours and on weekends, please call the GI answering service at [phone] who will take a message and have the physician on call contact you.   Following lower endoscopy (colonoscopy or flexible sigmoidoscopy):  Excessive amounts of blood in the stool  Significant tenderness or worsening of abdominal pains  Swelling of the abdomen that is new, acute  Fever of 100F or higher  FOLLOW UP: Our staff will call the home number listed on your records the next business day following your procedure to check on you and address any questions or concerns that you may have at that time regarding the information given to you following your procedure. This is a courtesy call and so if there is no answer at the home number and we have not heard from you through the emergency physician on call, we will assume that you have returned to your regular daily activities without incident.  SIGNATURES/CONFIDENTIALITY: You and/or your care partner have signed paperwork which will be entered into your electronic medical record.  These signatures attest to the fact that that the information above on your After Visit Summary has been reviewed and is understood.  Full responsibility of the confidentiality of this discharge information lies with you and/or your care-partner.  Continue your normal medications- including your daily Linzess for chronic constipation

## 2013-10-29 ENCOUNTER — Telehealth: Payer: Self-pay | Admitting: *Deleted

## 2013-10-29 NOTE — Telephone Encounter (Signed)
No answer, message left for the patient. 

## 2013-11-08 ENCOUNTER — Telehealth: Payer: Self-pay | Admitting: Internal Medicine

## 2013-11-08 NOTE — Telephone Encounter (Signed)
If no fever, blood or pain, can we have her be seen at Sat clinic?

## 2013-11-08 NOTE — Telephone Encounter (Signed)
Patient can not schedule appointment.  She believes she has a UTI b/c she has a strong odor when she urinates.  She would like to know what she could possible take over the counter for UTI.  Dr. Jonny RuizJohn patient

## 2013-11-08 NOTE — Telephone Encounter (Signed)
Dr, Jonny RuizJohn can pt have order to have UA....Linda Fuller/lmb

## 2013-11-08 NOTE — Telephone Encounter (Signed)
Called pt not able to come in tomorrow due to work she stated she will take azo otc & push fluids. Advise her if sxs persist will need to make appt...Raechel Chute/lmb

## 2013-11-12 ENCOUNTER — Encounter: Payer: Self-pay | Admitting: Internal Medicine

## 2013-11-12 ENCOUNTER — Ambulatory Visit (INDEPENDENT_AMBULATORY_CARE_PROVIDER_SITE_OTHER): Payer: No Typology Code available for payment source | Admitting: Internal Medicine

## 2013-11-12 VITALS — BP 130/80 | HR 92 | Temp 98.3°F | Ht 69.0 in | Wt 225.8 lb

## 2013-11-12 DIAGNOSIS — R300 Dysuria: Secondary | ICD-10-CM

## 2013-11-12 DIAGNOSIS — N3000 Acute cystitis without hematuria: Secondary | ICD-10-CM

## 2013-11-12 LAB — POCT URINALYSIS DIPSTICK
Bilirubin, UA: NEGATIVE
Glucose, UA: NEGATIVE
Ketones, UA: NEGATIVE
Nitrite, UA: NEGATIVE
Protein, UA: NEGATIVE
Spec Grav, UA: 1.02
Urobilinogen, UA: 0.2
pH, UA: 6

## 2013-11-12 MED ORDER — CEFUROXIME AXETIL 250 MG PO TABS: 250.0000 mg | ORAL_TABLET | Freq: Two times a day (BID) | ORAL | Status: DC

## 2013-11-12 NOTE — Progress Notes (Signed)
   Subjective:     Dysuria  This is a new problem. The current episode started in the past 7 days.  Not pregnant    Review of Systems  Constitutional: Negative for fever.  Gastrointestinal: Negative for abdominal pain and abdominal distention.  Genitourinary: Positive for dysuria. Negative for decreased urine volume and vaginal discharge.       Objective:   Physical Exam  Constitutional: She appears well-developed. No distress.  Abdominal: She exhibits no distension and no mass. There is tenderness (suprapubic).  Musculoskeletal: She exhibits no tenderness.  Psychiatric: Judgment normal.   UA       Assessment & Plan:

## 2013-11-12 NOTE — Assessment & Plan Note (Signed)
Ceftin x 5 d 

## 2013-11-12 NOTE — Progress Notes (Signed)
Pre visit review using our clinic review tool, if applicable. No additional management support is needed unless otherwise documented below in the visit note. 

## 2014-01-10 ENCOUNTER — Other Ambulatory Visit: Payer: Self-pay

## 2014-01-27 ENCOUNTER — Encounter: Payer: Self-pay | Admitting: Internal Medicine

## 2014-06-17 ENCOUNTER — Other Ambulatory Visit: Payer: Self-pay | Admitting: Internal Medicine

## 2014-07-14 ENCOUNTER — Ambulatory Visit: Payer: 59

## 2014-07-14 ENCOUNTER — Telehealth: Payer: Self-pay | Admitting: Family Medicine

## 2014-07-14 ENCOUNTER — Ambulatory Visit (INDEPENDENT_AMBULATORY_CARE_PROVIDER_SITE_OTHER): Payer: 59 | Admitting: Emergency Medicine

## 2014-07-14 VITALS — BP 112/76 | HR 82 | Temp 98.8°F | Resp 16 | Ht 68.0 in | Wt 227.8 lb

## 2014-07-14 DIAGNOSIS — K5900 Constipation, unspecified: Secondary | ICD-10-CM

## 2014-07-14 DIAGNOSIS — N912 Amenorrhea, unspecified: Secondary | ICD-10-CM

## 2014-07-14 DIAGNOSIS — R1010 Upper abdominal pain, unspecified: Secondary | ICD-10-CM | POA: Diagnosis not present

## 2014-07-14 LAB — POCT CBC
GRANULOCYTE PERCENT: 57.3 % (ref 37–80)
HEMATOCRIT: 36.9 % — AB (ref 37.7–47.9)
Hemoglobin: 11.9 g/dL — AB (ref 12.2–16.2)
Lymph, poc: 2.9 (ref 0.6–3.4)
MCH: 29.3 pg (ref 27–31.2)
MCHC: 32.4 g/dL (ref 31.8–35.4)
MCV: 90.6 fL (ref 80–97)
MID (cbc): 0.4 (ref 0–0.9)
MPV: 9.7 fL (ref 0–99.8)
PLATELET COUNT, POC: 286 10*3/uL (ref 142–424)
POC Granulocyte: 4.4 (ref 2–6.9)
POC LYMPH %: 37.9 % (ref 10–50)
POC MID %: 4.8 %M (ref 0–12)
RBC: 4.07 M/uL (ref 4.04–5.48)
RDW, POC: 14.2 %
WBC: 7.7 10*3/uL (ref 4.6–10.2)

## 2014-07-14 LAB — POCT URINE PREGNANCY: Preg Test, Ur: POSITIVE

## 2014-07-14 LAB — HCG, QUANTITATIVE, PREGNANCY: hCG, Beta Chain, Quant, S: <2 m[IU]/mL

## 2014-07-14 MED ORDER — PRENATAL 27-0.8 MG PO TABS: 1.0000 | ORAL_TABLET | Freq: Every day | ORAL | Status: DC

## 2014-07-14 NOTE — Telephone Encounter (Signed)
I was called with stat results from [facility].  Please let pt know that her blood pregnancy test is negative so most likely pt is NOT pregnant and positive urine pregnancy test in the office yesterday was likely an error but pt should come back into clinic asap today for repeat testing and further eval to confirm.

## 2014-07-14 NOTE — Progress Notes (Signed)
[facility] [address], [place] [address] [phone]  Date:  07/14/2014   Name:  Linda Fuller   DOB:  01/13/1991   MRN:  [id number]  PCP:  Oliver BarreJames John, MD    Chief Complaint: Abdominal Pain   History of Present Illness:  Linda Fuller is a 24 y.o. very pleasant female patient who presents with the following:  Patient has a long history of constipation that is not labeled IBS but the patient insists she has the condition Has increasing constipation and now upper abdominal pain.  The patient has no complaint of blood, mucous, or pus in her stools. No fever or chills.   No nausea or vomiting.  Says she has decreased appetite but has not lost weight. Is not compliant with her medication, taking it only sporadically Sexually active with no contraception. No improvement with over the counter medications or other home remedies.  Denies other complaint or health concern today.   Patient Active Problem List   Diagnosis Date Noted  . Acute cystitis 11/12/2013  . Acute pharyngitis 09/04/2013  . Delayed menses 09/04/2013  . Chronic constipation 01/22/2013  . Hematochezia 01/22/2013  . Rash and nonspecific skin eruption 01/22/2013  . Genital herpes 06/12/2012  . Postpartum care following vaginal delivery (7/20) 10/15/2011  . Preventative health care 08/16/2010  . ANXIETY 01/27/2010  . BACK PAIN 10/03/2008    Past Medical History  Diagnosis Date  . ANXIETY 01/27/2010  . CONSTIPATION 09/15/2009  . Anemia   . GERD (gastroesophageal reflux disease)   . IBS (irritable bowel syndrome)     History reviewed. No pertinent past surgical history.  History  Substance Use Topics  . Smoking status: Never Smoker   . Smokeless tobacco: Never Used  . Alcohol Use: 0.6 oz/week    1 Glasses of wine per week    Family History  Problem Relation Age of Onset  . Heart disease Mother     Pacemaker   . Diabetes Mother   . Colon cancer Neg Hx   . Stroke Maternal  Grandmother   . Stroke Maternal Grandfather     No Known Allergies  Medication list has been reviewed and updated.  Current Outpatient Prescriptions on File Prior to Visit  Medication Sig Dispense Refill  . LINZESS 290 MCG CAPS capsule TAKE ONE CAPSULE BY MOUTH EVERY DAY 30 capsule 5  . valACYclovir (VALTREX) 500 MG tablet Take 1 tablet (500 mg total) by mouth daily. 90 tablet 3   No current facility-administered medications on file prior to visit.    Review of Systems:  As per HPI, otherwise negative.    Physical Examination: Filed Vitals:   07/14/14 1918  BP: 112/76  Pulse: 82  Temp: 98.8 F (37.1 C)  Resp: 16   Filed Vitals:   07/14/14 1918  Height: 5\' 8"  (1.727 m)  Weight: 227 lb 12.8 oz (103.329 kg)   Body mass index is 34.64 kg/(m^2). Ideal Body Weight: Weight in (lb) to have BMI = 25: 164.1  GEN: WDWN, NAD, Non-toxic, A & O x 3 HEENT: Atraumatic, Normocephalic. Neck supple. No masses, No LAD. Ears and Nose: No external deformity. CV: RRR, No M/G/R. No JVD. No thrill. No extra heart sounds. PULM: CTA B, no wheezes, crackles, rhonchi. No retractions. No resp. distress. No accessory muscle use. ABD: S, NT, ND, +BS. No rebound. No HSM. EXTR: No c/c/e NEURO Normal gait.  PSYCH: Normally interactive. Conversant. Not depressed or anxious appearing.  Calm demeanor.    Assessment and Plan: Abdominal pain Constipation Labs pending Take medication as prescribed Follow up with GI Amenorrhea Positive pregnancy test GYN Prenatal vits  Signed,  Phillips Odor, MD

## 2014-07-14 NOTE — Patient Instructions (Signed)
First Trimester of Pregnancy The first trimester of pregnancy is from week 1 until the end of week 12 (months 1 through 3). A week after a sperm fertilizes an egg, the egg will implant on the wall of the uterus. This embryo will begin to develop into a baby. Genes from you and your partner are forming the baby. The female genes determine whether the baby is a boy or a girl. At 6-8 weeks, the eyes and face are formed, and the heartbeat can be seen on ultrasound. At the end of 12 weeks, all the baby's organs are formed.  Now that you are pregnant, you will want to do everything you can to have a healthy baby. Two of the most important things are to get good prenatal care and to follow your health care provider's instructions. Prenatal care is all the medical care you receive before the baby's birth. This care will help prevent, find, and treat any problems during the pregnancy and childbirth. BODY CHANGES Your body goes through many changes during pregnancy. The changes vary from woman to woman.   You may gain or lose a couple of pounds at first.  You may feel sick to your stomach (nauseous) and throw up (vomit). If the vomiting is uncontrollable, call your health care provider.  You may tire easily.  You may develop headaches that can be relieved by medicines approved by your health care provider.  You may urinate more often. Painful urination may mean you have a bladder infection.  You may develop heartburn as a result of your pregnancy.  You may develop constipation because certain hormones are causing the muscles that push waste through your intestines to slow down.  You may develop hemorrhoids or swollen, bulging veins (varicose veins).  Your breasts may begin to grow larger and become tender. Your nipples may stick out more, and the tissue that surrounds them (areola) may become darker.  Your gums may bleed and may be sensitive to brushing and flossing.  Dark spots or blotches (chloasma,  mask of pregnancy) may develop on your face. This will likely fade after the baby is born.  Your menstrual periods will stop.  You may have a loss of appetite.  You may develop cravings for certain kinds of food.  You may have changes in your emotions from day to day, such as being excited to be pregnant or being concerned that something may go wrong with the pregnancy and baby.  You may have more vivid and strange dreams.  You may have changes in your hair. These can include thickening of your hair, rapid growth, and changes in texture. Some women also have hair loss during or after pregnancy, or hair that feels dry or thin. Your hair will most likely return to normal after your baby is born. WHAT TO EXPECT AT YOUR PRENATAL VISITS During a routine prenatal visit:  You will be weighed to make sure you and the baby are growing normally.  Your blood pressure will be taken.  Your abdomen will be measured to track your baby's growth.  The fetal heartbeat will be listened to starting around week 10 or 12 of your pregnancy.  Test results from any previous visits will be discussed. Your health care provider may ask you:  How you are feeling.  If you are feeling the baby move.  If you have had any abnormal symptoms, such as leaking fluid, bleeding, severe headaches, or abdominal cramping.  If you have any questions. Other tests   that may be performed during your first trimester include:  Blood tests to find your blood type and to check for the presence of any previous infections. They will also be used to check for low iron levels (anemia) and Rh antibodies. Later in the pregnancy, blood tests for diabetes will be done along with other tests if problems develop.  Urine tests to check for infections, diabetes, or protein in the urine.  An ultrasound to confirm the proper growth and development of the baby.  An amniocentesis to check for possible genetic problems.  Fetal screens for  spina bifida and Down syndrome.  You may need other tests to make sure you and the baby are doing well. HOME CARE INSTRUCTIONS  Medicines  Follow your health care provider's instructions regarding medicine use. Specific medicines may be either safe or unsafe to take during pregnancy.  Take your prenatal vitamins as directed.  If you develop constipation, try taking a stool softener if your health care provider approves. Diet  Eat regular, well-balanced meals. Choose a variety of foods, such as meat or vegetable-based protein, fish, milk and low-fat dairy products, vegetables, fruits, and whole grain breads and cereals. Your health care provider will help you determine the amount of weight gain that is right for you.  Avoid raw meat and uncooked cheese. These carry germs that can cause birth defects in the baby.  Eating four or five small meals rather than three large meals a day may help relieve nausea and vomiting. If you start to feel nauseous, eating a few soda crackers can be helpful. Drinking liquids between meals instead of during meals also seems to help nausea and vomiting.  If you develop constipation, eat more high-fiber foods, such as fresh vegetables or fruit and whole grains. Drink enough fluids to keep your urine clear or pale yellow. Activity and Exercise  Exercise only as directed by your health care provider. Exercising will help you:  Control your weight.  Stay in shape.  Be prepared for labor and delivery.  Experiencing pain or cramping in the lower abdomen or low back is a good sign that you should stop exercising. Check with your health care provider before continuing normal exercises.  Try to avoid standing for long periods of time. Move your legs often if you must stand in one place for a long time.  Avoid heavy lifting.  Wear low-heeled shoes, and practice good posture.  You may continue to have sex unless your health care provider directs you  otherwise. Relief of Pain or Discomfort  Wear a good support bra for breast tenderness.   Take warm sitz baths to soothe any pain or discomfort caused by hemorrhoids. Use hemorrhoid cream if your health care provider approves.   Rest with your legs elevated if you have leg cramps or low back pain.  If you develop varicose veins in your legs, wear support hose. Elevate your feet for 15 minutes, 3-4 times a day. Limit salt in your diet. Prenatal Care  Schedule your prenatal visits by the twelfth week of pregnancy. They are usually scheduled monthly at first, then more often in the last 2 months before delivery.  Write down your questions. Take them to your prenatal visits.  Keep all your prenatal visits as directed by your health care provider. Safety  Wear your seat belt at all times when driving.  Make a list of emergency phone numbers, including numbers for family, friends, the hospital, and police and fire departments. General Tips    Ask your health care provider for a referral to a local prenatal education class. Begin classes no later than at the beginning of month 6 of your pregnancy.  Ask for help if you have counseling or nutritional needs during pregnancy. Your health care provider can offer advice or refer you to specialists for help with various needs.  Do not use hot tubs, steam rooms, or saunas.  Do not douche or use tampons or scented sanitary pads.  Do not cross your legs for long periods of time.  Avoid cat litter boxes and soil used by cats. These carry germs that can cause birth defects in the baby and possibly loss of the fetus by miscarriage or stillbirth.  Avoid all smoking, herbs, alcohol, and medicines not prescribed by your health care provider. Chemicals in these affect the formation and growth of the baby.  Schedule a dentist appointment. At home, brush your teeth with a soft toothbrush and be gentle when you floss. SEEK MEDICAL CARE IF:   You have  dizziness.  You have mild pelvic cramps, pelvic pressure, or nagging pain in the abdominal area.  You have persistent nausea, vomiting, or diarrhea.  You have a bad smelling vaginal discharge.  You have pain with urination.  You notice increased swelling in your face, hands, legs, or ankles. SEEK IMMEDIATE MEDICAL CARE IF:   You have a fever.  You are leaking fluid from your vagina.  You have spotting or bleeding from your vagina.  You have severe abdominal cramping or pain.  You have rapid weight gain or loss.  You vomit blood or material that looks like coffee grounds.  You are exposed to German measles and have never had them.  You are exposed to fifth disease or chickenpox.  You develop a severe headache.  You have shortness of breath.  You have any kind of trauma, such as from a fall or a car accident. Document Released: 03/08/2001 Document Revised: 07/29/2013 Document Reviewed: 01/22/2013 Exit[employer] Patient Information 2015 [employer]. This information is not intended to replace advice given to you by your health care provider. Make sure you discuss any questions you have with your health care provider.  

## 2014-07-15 LAB — COMPREHENSIVE METABOLIC PANEL
ALBUMIN: 4.3 g/dL (ref 3.5–5.2)
ALT: 14 U/L (ref 0–35)
AST: 15 U/L (ref 0–37)
Alkaline Phosphatase: 49 U/L (ref 39–117)
BUN: 12 mg/dL (ref 6–23)
CHLORIDE: 104 meq/L (ref 96–112)
CO2: 24 meq/L (ref 19–32)
Calcium: 9.3 mg/dL (ref 8.4–10.5)
Creat: 0.78 mg/dL (ref 0.50–1.10)
GLUCOSE: 109 mg/dL — AB (ref 70–99)
POTASSIUM: 3.7 meq/L (ref 3.5–5.3)
SODIUM: 137 meq/L (ref 135–145)
TOTAL PROTEIN: 7.5 g/dL (ref 6.0–8.3)
Total Bilirubin: 0.3 mg/dL (ref 0.2–1.2)

## 2014-07-15 LAB — TSH: TSH: 1.505 u[IU]/mL (ref 0.350–4.500)

## 2014-07-15 NOTE — Telephone Encounter (Signed)
lmom to cb. 

## 2014-07-15 NOTE — Telephone Encounter (Signed)
Linda Fuller spoke with pt.

## 2014-07-15 NOTE — Telephone Encounter (Signed)
Pt returned call, left a message requesting results of her pregnancy test  lmom for pt to cb

## 2014-07-17 ENCOUNTER — Ambulatory Visit (INDEPENDENT_AMBULATORY_CARE_PROVIDER_SITE_OTHER): Payer: 59

## 2014-07-17 ENCOUNTER — Ambulatory Visit (INDEPENDENT_AMBULATORY_CARE_PROVIDER_SITE_OTHER): Payer: 59 | Admitting: Emergency Medicine

## 2014-07-17 VITALS — BP 120/72 | HR 79 | Temp 98.5°F | Resp 12 | Ht 67.75 in | Wt 227.0 lb

## 2014-07-17 DIAGNOSIS — K5900 Constipation, unspecified: Secondary | ICD-10-CM

## 2014-07-17 DIAGNOSIS — R1084 Generalized abdominal pain: Secondary | ICD-10-CM

## 2014-07-17 LAB — POCT URINALYSIS DIPSTICK
Bilirubin, UA: NEGATIVE
Blood, UA: NEGATIVE
Glucose, UA: NEGATIVE
Ketones, UA: NEGATIVE
Nitrite, UA: NEGATIVE
Protein, UA: NEGATIVE
Spec Grav, UA: 1.02
Urobilinogen, UA: 0.2
pH, UA: 6

## 2014-07-17 LAB — POCT UA - MICROSCOPIC ONLY
Casts, Ur, LPF, POC: NEGATIVE
Crystals, Ur, HPF, POC: NEGATIVE
Mucus, UA: NEGATIVE
Yeast, UA: NEGATIVE

## 2014-07-17 NOTE — Progress Notes (Signed)
Subjective:    Patient ID: Linda Fuller, female    DOB: 03/21/1991, 24 y.o.   MRN: [id number]  HPI  [name] is a 24 year old female who presents today for follow up evaluation of abdominal pain.  She was seen in the clinic 07/14/14 with the same symptoms. Urine pregnancy test came back positive. Abdominal x-ray was not performed as a result. Subsequent quantitative b-HCG was negative. Patient returns today for abdominal x-ray.  She has chronic constipation and takes Linzess every other day. She takes the medication at night to help her have a bowel movement in the morning. Recently her bowel movements have been runny and very liquid. She has not recently had a solid bowel movement, but continues to have loose stool. She has not taken other medications for the pain. She had a flexible sigmoidoscopy done 10/28/13 by Dr. Gerilyn PilgrimJacob with no abnormal findings. Says she is scheduled to see the gastroenterologist 08/17/14.   Today she is nauseous and having increased pain. The pain is worse in her upper abdomen, but generalized throughout her abdomen. Pain also radiates to her lower back, but does not radiate down her legs. Says "the pain is a sharp piercing into my stomach." Notes that the pain is worse with anything that she eats. Tries to avoid fatty and fried foods because that makes it worse. Feels bloated after eating any meal, even something as small as an apple. Pain is worse at night, but does not change with lying down. Pain lasts throughout the day, but comes and goes in spurts.   She notes a subjective weight loss of 5 pounds in the last 2 weeks. She has been avoiding fried and fatty foods, and contributes the weight loss to her change in diet, although she was not trying to lose weight.    Review of Systems  Constitutional: Positive for unexpected weight change (5 pound weight loss in last 2 weeks). Negative for fever, chills, diaphoresis and fatigue.  HENT: Negative for congestion, ear pain,  rhinorrhea, sinus pressure and sore throat.   Eyes: Negative for pain and itching.  Respiratory: Negative for cough, chest tightness and shortness of breath.   Cardiovascular: Negative for chest pain and palpitations.  Gastrointestinal: Positive for nausea, abdominal pain and constipation (Chronic). Negative for vomiting and blood in stool.  Genitourinary: Negative.   Musculoskeletal: Positive for back pain (Low back pain).  Neurological: Negative for headaches.       Objective:   Physical Exam  Constitutional: She is oriented to person, place, and time. She appears well-developed and well-nourished. No distress.  BP 120/72 mmHg  Pulse 79  Temp(Src) 98.5 F (36.9 C) (Oral)  Resp 12  Ht 5' 7.75" (1.721 m)  Wt 227 lb (102.967 kg)  BMI 34.76 kg/m2  SpO2 97%  LMP 06/27/2014  HENT:  Head: Normocephalic and atraumatic.  Eyes: Conjunctivae are normal.  Neck: Neck supple.  Cardiovascular: Normal rate, regular rhythm and normal heart sounds.  Exam reveals no gallop and no friction rub.   No murmur heard. Pulmonary/Chest: Effort normal and breath sounds normal. She has no wheezes. She has no rales.  Abdominal: Soft. Bowel sounds are normal. She exhibits no mass. There is no hepatosplenomegaly. There is generalized tenderness. There is no guarding and no CVA tenderness.  Lymphadenopathy:    She has no cervical adenopathy.  Neurological: She is alert and oriented to person, place, and time.  Skin: Skin is warm and dry.  Psychiatric: She has a  normal mood and affect.   Results for orders placed or performed in visit on 07/17/14 (from the past 24 hour(s))  POCT UA - Microscopic Only     Status: None   Collection Time: 07/17/14  7:32 PM  Result Value Ref Range   WBC, Ur, HPF, POC 2-3    RBC, urine, microscopic 0-1    Bacteria, U Microscopic trace    Mucus, UA neg    Epithelial cells, urine per micros 1-3    Crystals, Ur, HPF, POC neg    Casts, Ur, LPF, POC neg    Yeast, UA neg     POCT urinalysis dipstick     Status: None   Collection Time: 07/17/14  7:32 PM  Result Value Ref Range   Color, UA yellow    Clarity, UA clear    Glucose, UA neg    Bilirubin, UA neg    Ketones, UA neg    Spec Grav, UA 1.020    Blood, UA neg    pH, UA 6.0    Protein, UA neg    Urobilinogen, UA 0.2    Nitrite, UA neg    Leukocytes, UA small (1+)    Preliminary KUB reading with Dr. Dareen Piano: no acute abnormalities, increased stool burden in the colon.    Assessment & Plan:  1. Generalized abdominal pain Chronic constipation. Advised patient to do Miralax purge tonight or tomorrow to assist with bowel movement and relief of pain. Advised her to keep her GI appointment and follow up with them.  - POCT UA - Microscopic Only - POCT urinalysis dipstick - DG Abd 1 View; Future

## 2014-07-17 NOTE — Progress Notes (Signed)
Subjective:    Patient ID: Linda Fuller, female    DOB: March 18, 1991, 24 y.o.   MRN: [id number]  HPI This is a very pleasant 24 yo female who presents today with continued generalized abdominal pain. She was seen 3 days ago with similar symptoms. At that time, labs were drawn, including CBC, CMET, TSH and urine pregnancy performed. No leukocytosis, TSH and CMET normal. Urine pregnancy test was positive, but hCG quantitative was negative. The patient was told to come in for an abdominal Xray since her pregnancy test was negative. She describes her pain as intermittent, aching and present in her upper abdomen radiating into her back. 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   The patient has longstanding problems with constipation and had normal flex sig by Dr. Christella Hartigan 10/28/13. She has been on Linzess prescribed by Dr. [name] since 10/14. She states she takes this every other day and lately has had watery stools every other morning. She has not recently noticed any blood in her stool. Her stool ranges in color from brown to green. She has not seen any mucus. After having liquid BM, still feels pressure and urge to deficate, not sure if this is related to her chronic hemorrhoids.   She reports that she has a follow up GI appointment next month.   Review of Systems No fever/chills, occasional nausea, no vomiting, no dysuria, no hematuria, no urinary frequency. Weight loss of 5 pounds in last 2 weeks.     Objective:   Physical Exam  Constitutional: She is oriented to person, place, and time. She appears well-developed and well-nourished.  HENT:  Head: Normocephalic and atraumatic.  Eyes: Conjunctivae are normal.  Neck: Normal range of motion. Neck supple.  Cardiovascular: Normal rate, regular rhythm and normal heart sounds.   Pulmonary/Chest: Effort normal and breath sounds normal.  Abdominal: Soft. Bowel sounds are normal. She exhibits no distension and no mass. There is no tenderness. There is  no rebound and no guarding.  PA student found patient to be tender with abdominal exam. The patient did not express any tenderness verbally or nonverbally with abdominal exam. i was speaking to her throughout the palpation. Patient declined rectal exam.   Musculoskeletal: Normal range of motion.  Neurological: She is alert and oriented to person, place, and time.  Skin: Skin is warm and dry.  Psychiatric: She has a normal mood and affect. Her behavior is normal. Judgment and thought content normal.  Vitals reviewed. BP 120/72 mmHg  Pulse 79  Temp(Src) 98.5 F (36.9 C) (Oral)  Resp 12  Ht 5' 7.75" (1.721 m)  Wt 227 lb (102.967 kg)  BMI 34.76 kg/m2  SpO2 97%  LMP 06/27/2014  Weights- Today 227 07/14/2014 227 11/12/2013 225  [facility] reading (PRIMARY) by  Dr. Dareen Piano- no acute abnormality, large stool burden. Results for orders placed or performed in visit on 07/17/14  POCT UA - Microscopic Only  Result Value Ref Range   WBC, Ur, HPF, POC 2-3    RBC, urine, microscopic 0-1    Bacteria, U Microscopic trace    Mucus, UA neg    Epithelial cells, urine per micros 1-3    Crystals, Ur, HPF, POC neg    Casts, Ur, LPF, POC neg    Yeast, UA neg   POCT urinalysis dipstick  Result Value Ref Range   Color, UA yellow    Clarity, UA clear    Glucose, UA neg    Bilirubin, UA  neg    Ketones, UA neg    Spec Grav, UA 1.020    Blood, UA neg    pH, UA 6.0    Protein, UA neg    Urobilinogen, UA 0.2    Nitrite, UA neg    Leukocytes, UA small (1+)       Assessment & Plan:  1. Generalized abdominal pain - POCT UA - Microscopic Only - POCT urinalysis dipstick - DG Abd 1 View; Future - follow up with GI as scheduled  2. Constipation, unspecified constipation type - Discussed xray results and need to treat acute constipation.  - Suggested Miralax purge. Patient states she has to go to work tomorrow, asked if she could do this over the weekend. Discussed taking 2 doses of Miralax tonight and  if she does not have good results, instructions given for Miralax purge (2 Dulcolax followed in 1 hour by 6 to 8 doses of Miralax over the next 2-3 hours) - Follow up if no improvement, consider abdominal US   Emi Belfasteborah B. Tiegan Jambor, FNP-BC  [facility], [facility]  07/17/2014 10:37 PM

## 2014-07-17 NOTE — Patient Instructions (Signed)
Take 2 doses of Miralax tonight. If needed, do Miralax purge tomorrow: 2 Dulcalax tablets. Wait one hour, then take 6-8 doses of Miralax over the next 2-3 hours. Can mix with water or Gatorade. Stay near the toilet. Please let us know if your pain does not improve with this.

## 2014-07-18 ENCOUNTER — Telehealth: Payer: Self-pay | Admitting: *Deleted

## 2014-07-18 ENCOUNTER — Telehealth: Payer: Self-pay | Admitting: Gastroenterology

## 2014-07-18 NOTE — Telephone Encounter (Signed)
Pt was advised to call Urgent care and let them know what is going on and she was also sch to see Gunnar Fusiaula on 07/22/14.

## 2014-07-18 NOTE — Telephone Encounter (Signed)
Pt called and stated that is still constipated and has not had any relief from the medication she was given on yesterday.  She would like for another medication to be called in so that she may feel better.

## 2014-07-18 NOTE — Telephone Encounter (Signed)
Called patient who took 2 ducolax and 2 doses of miralax last night without results. Took 1 dulcolax and 2 doses of miralax today. No results. Drinking lots of water and gatorade. Instructed her to take 3 more doses of miralax in the next 1-2 hours and let me know if no results.

## 2014-07-22 ENCOUNTER — Ambulatory Visit (INDEPENDENT_AMBULATORY_CARE_PROVIDER_SITE_OTHER): Payer: No Typology Code available for payment source | Admitting: Nurse Practitioner

## 2014-07-22 ENCOUNTER — Encounter: Payer: Self-pay | Admitting: Nurse Practitioner

## 2014-07-22 VITALS — BP 122/62 | HR 80 | Ht 67.5 in | Wt 229.5 lb

## 2014-07-22 DIAGNOSIS — K5909 Other constipation: Secondary | ICD-10-CM

## 2014-07-22 DIAGNOSIS — K5900 Constipation, unspecified: Secondary | ICD-10-CM | POA: Diagnosis not present

## 2014-07-22 NOTE — Progress Notes (Signed)
i agree with the above note, plan 

## 2014-07-22 NOTE — Patient Instructions (Addendum)
Glad your doing better.  Gunnar Fusiaula recommends that you complete a bowel purge (to clean out your bowels). Please do the following: Purchase a bottle of Miralax over the counter as well as a box of 5 mg dulcolax tablets. Take 4 dulcolax tablets. Wait 1 hour. You will then drink 6-8 capfuls of Miralax mixed in an adequate amount of water/juice/gatorade (you may choose which of these liquids to drink) over the next 2-3 hours. You should expect results within 1 to 6 hours after completing the bowel purge.     I appreciate the opportunity to care for you.

## 2014-07-22 NOTE — Progress Notes (Signed)
     History of Present Illness:   Patient is a 24 year old female known to Dr. Christella HartiganJacobs. She has chronic constipation. I saw her last year for progressive constipation and rectal bleeding. She subsequently underwent flexible sigmoidoscopy (August 2015) which was normal to the splenic flexure. We prescribed Linzess but patient only took it every other night as it gave her diarrhea. Unhappy about this she stopped the medication. She was seen at Urgent Care 07/17/14 for abdominal pain. KUB showed moderate colonic stool burden.  Patient started daily MiraLAX and stool softeners and for the last week has been having normal bowel movements. Abdominal pain has significantly improved. She is drinking more water  Current Medications, Allergies, Past Medical History, Past Surgical History, Family History and Social History were [facility].  Studies:   Dg Abd 1 View  07/17/2014   CLINICAL DATA:  Abdominal pain  EXAM: ABDOMEN - 1 VIEW  COMPARISON:  08/17/2010  FINDINGS: Moderate colonic stool burden without evidence of enteric obstruction.  Nondiagnostic evaluation for pneumoperitoneum secondary to supine positioning and exclusion the lower thorax. No definite pneumatosis.  No definite abnormal intra-abdominal calcifications.  No acute osseus abnormalities.  IMPRESSION: Moderate colonic stool burden without evidence of obstruction.   Electronically Signed   By: Simonne ComeJohn  Watts M.D.   On: 07/17/2014 20:16    Physical Exam: General: Pleasant, obese black female in no acute distress Head: Normocephalic and atraumatic Eyes:  sclerae anicteric, conjunctiva pink  Ears: Normal auditory acuity Lungs: Clear throughout to auscultation Heart: Regular rate and rhythm Abdomen: Soft, non distended, non-tender. No masses, no hepatomegaly. Normal bowel sounds Musculoskeletal: Symmetrical with no gross deformities  Extremities: No edema  Neurological: Alert oriented x 4, grossly  nonfocal Psychological:  Alert and cooperative. Normal mood and affect  Assessment and Recommendations:  24 year old female with chronic constipation and associated abdominal pain. 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 Tried on Linzess, but made stools too loose. She was only taking the medication every other day and was taking it at bedtime. For now will continue the Miralax and stool softener regimen she is on since it seems to be working. Patient is interested in purging her bowels for some immediate relief this is reasonable. We will do a MiraLAX bowel prep. Following that patient will conntinue the daily MiraLAX and stool softeners.  She has increased her water intake and that is good. Encouraged high fiber diet. If patient has recurrent constipation she will retry the Linzess but take it every morning with food next time.

## 2014-08-06 ENCOUNTER — Encounter: Payer: Self-pay | Admitting: Internal Medicine

## 2014-08-06 ENCOUNTER — Ambulatory Visit (INDEPENDENT_AMBULATORY_CARE_PROVIDER_SITE_OTHER): Payer: No Typology Code available for payment source | Admitting: Internal Medicine

## 2014-08-06 VITALS — BP 128/68 | HR 84 | Temp 98.0°F | Wt 229.0 lb

## 2014-08-06 DIAGNOSIS — J029 Acute pharyngitis, unspecified: Secondary | ICD-10-CM

## 2014-08-06 MED ORDER — AZITHROMYCIN 250 MG PO TABS
ORAL_TABLET | ORAL | Status: DC
Start: 2014-08-06 — End: 2014-09-10

## 2014-08-06 NOTE — Progress Notes (Signed)
Pre visit review using our clinic review tool, if applicable. No additional management support is needed unless otherwise documented below in the visit note. 

## 2014-08-06 NOTE — Progress Notes (Signed)
   Subjective:    Patient ID: Linda Fuller, female    DOB: 04/03/1990, 24 y.o.   MRN: [id number]  HPI   Here with 2 wks acute onset fever, severe ST, pressure, headache, general weakness and malaise, and occas non prod cough, but pt denies chest pain, wheezing, increased sob or doe, orthopnea, PND, increased LE swelling, palpitations, dizziness or syncope. Son ill with similar in daycare recent. Past Medical History  Diagnosis Date  . ANXIETY 01/27/2010  . CONSTIPATION 09/15/2009  . Anemia   . GERD (gastroesophageal reflux disease)   . IBS (irritable bowel syndrome)    No past surgical history on file.  reports that she has never smoked. She has never used smokeless tobacco. She reports that she drinks about 0.6 oz of alcohol per week. She reports that she does not use illicit drugs. family history includes Diabetes in her mother; Heart disease in her mother; Stroke in her maternal grandfather and maternal grandmother. There is no history of Colon cancer. No Known Allergies Current Outpatient Prescriptions on File Prior to Visit  Medication Sig Dispense Refill  . valACYclovir (VALTREX) 500 MG tablet Take 1 tablet (500 mg total) by mouth daily. 90 tablet 3   No current facility-administered medications on file prior to visit.    Review of Systems All otherwise neg per pt     Objective:   Physical Exam BP 128/68 mmHg  Pulse 84  Temp(Src) 98 F (36.7 C) (Oral)  Wt 229 lb (103.874 kg)  SpO2 98%  LMP 07/21/2014 VS noted,  Constitutional: Pt appears in no significant distress HENT: Head: NCAT.  Right Ear: External ear normal.  Left Ear: External ear normal.  Bilat tm's with mild erythema.  Max sinus areas non tender.  Pharynx with severe erythema, + exudate, bilat submandib LA Eyes: . Pupils are equal, round, and reactive to light. Conjunctivae and EOM are normal Neck: Normal range of motion. Neck supple.  Cardiovascular: Normal rate and regular rhythm.   Pulmonary/Chest: Effort  normal and breath sounds without rales or wheezing.  Neurological: Pt is alert. Not confused , motor grossly intact Skin: Skin is warm. No rash, no LE edema Psychiatric: Pt behavior is normal. No agitation.     Assessment & Plan:

## 2014-08-06 NOTE — Assessment & Plan Note (Signed)
Mild to mod, for antibx course,  to f/u any worsening symptoms or concerns 

## 2014-08-06 NOTE — Patient Instructions (Signed)
Please take all new medication as prescribed - the antibiotic  Please continue all other medications as before, and refills have been done if requested.  Please have the pharmacy call with any other refills you may need.  Please keep your appointments with your specialists as you may have planned   

## 2014-09-10 ENCOUNTER — Encounter: Payer: Self-pay | Admitting: Internal Medicine

## 2014-09-10 ENCOUNTER — Ambulatory Visit (INDEPENDENT_AMBULATORY_CARE_PROVIDER_SITE_OTHER): Payer: No Typology Code available for payment source | Admitting: Internal Medicine

## 2014-09-10 VITALS — BP 124/86 | HR 84 | Temp 97.9°F | Ht 68.0 in | Wt 232.0 lb

## 2014-09-10 DIAGNOSIS — R5383 Other fatigue: Secondary | ICD-10-CM

## 2014-09-10 DIAGNOSIS — K219 Gastro-esophageal reflux disease without esophagitis: Secondary | ICD-10-CM | POA: Diagnosis not present

## 2014-09-10 DIAGNOSIS — E669 Obesity, unspecified: Secondary | ICD-10-CM | POA: Diagnosis not present

## 2014-09-10 DIAGNOSIS — G43909 Migraine, unspecified, not intractable, without status migrainosus: Secondary | ICD-10-CM | POA: Insufficient documentation

## 2014-09-10 DIAGNOSIS — G43809 Other migraine, not intractable, without status migrainosus: Secondary | ICD-10-CM

## 2014-09-10 DIAGNOSIS — F418 Other specified anxiety disorders: Secondary | ICD-10-CM

## 2014-09-10 DIAGNOSIS — F419 Anxiety disorder, unspecified: Secondary | ICD-10-CM

## 2014-09-10 DIAGNOSIS — F329 Major depressive disorder, single episode, unspecified: Secondary | ICD-10-CM

## 2014-09-10 DIAGNOSIS — Z202 Contact with and (suspected) exposure to infections with a predominantly sexual mode of transmission: Secondary | ICD-10-CM | POA: Insufficient documentation

## 2014-09-10 HISTORY — DX: Migraine, unspecified, not intractable, without status migrainosus: G43.909

## 2014-09-10 HISTORY — DX: Obesity, unspecified: E66.9

## 2014-09-10 MED ORDER — PANTOPRAZOLE SODIUM 40 MG PO TBEC: 40.0000 mg | DELAYED_RELEASE_TABLET | Freq: Every day | ORAL | Status: DC

## 2014-09-10 MED ORDER — ESCITALOPRAM OXALATE 10 MG PO TABS: 10.0000 mg | ORAL_TABLET | Freq: Every day | ORAL | Status: DC

## 2014-09-10 MED ORDER — PHENTERMINE HCL 37.5 MG PO CAPS: 37.5000 mg | ORAL_CAPSULE | ORAL | Status: DC

## 2014-09-10 MED ORDER — SUMATRIPTAN SUCCINATE 100 MG PO TABS: 100.0000 mg | ORAL_TABLET | ORAL | Status: DC | PRN

## 2014-09-10 NOTE — Progress Notes (Signed)
Pre visit review using our clinic review tool, if applicable. No additional management support is needed unless otherwise documented below in the visit note. 

## 2014-09-10 NOTE — Assessment & Plan Note (Signed)
Ok for protonix 40 qd,  to f/u any worsening symptoms or concerns  

## 2014-09-10 NOTE — Assessment & Plan Note (Signed)
Rare, ok for imitrex prn,  to f/u any worsening symptoms or concerns

## 2014-09-10 NOTE — Assessment & Plan Note (Addendum)
Etiology unclear, Exam otherwise benign, to check labs as documented, follow with expectant management  Note:  Total time for pt hx, exam, review of record with pt in the room, determination of diagnoses and plan for further eval and tx is > 40 min, with over 50% spent in coordination and counseling of patient  

## 2014-09-10 NOTE — Assessment & Plan Note (Signed)
Pt asks for STD check, has hx of genital herpes, asympt at this time

## 2014-09-10 NOTE — Assessment & Plan Note (Addendum)
Mild,for  lexapro 10 qd, verified no SI or HI,  to f/u any worsening symptoms or concerns, delcines counseling for now  Note:  Total time for pt hx, exam, review of record with pt in the room, determination of diagnoses and plan for further eval and tx is > 40 min, with over 50% spent in coordination and counseling of patient

## 2014-09-10 NOTE — Patient Instructions (Signed)
Please take all new medication as prescribed - the imitrex for headaches if needed, lexapro every day, protonix for heartburn every day, and phentermine for the weight loss  Please continue all other medications as before, and refills have been done if requested.  Please have the pharmacy call with any other refills you may need.  Please keep your appointments with your specialists as you may have planned  Please go to the LAB in the Basement (turn left off the elevator) for the tests to be done today  You will be contacted by phone if any changes need to be made immediately.  Otherwise, you will receive a letter about your results with an explanation, but please check with MyChart first.  Please remember to sign up for MyChart if you have not done so, as this will be important to you in the future with finding out test results, communicating by private email, and scheduling acute appointments online when needed.

## 2014-09-10 NOTE — Assessment & Plan Note (Signed)
Ok for phenetermine asd,  to f/u any worsening symptoms or concerns

## 2014-09-10 NOTE — Progress Notes (Signed)
   Subjective:    Patient ID: Linda Fuller, female    DOB: May 31, 1990, 24 y.o.   MRN: [id number]  HPI  Here to f/u, c/o ongoing fatigue with some daytime somnolence but cannot take naps, has 48 yo son.  Has gained wt -  Wt Readings from Last 3 Encounters:  09/10/14 232 lb (105.235 kg)  08/06/14 229 lb (103.874 kg)  07/22/14 229 lb 8 oz (104.101 kg)  Pt denies chest pain, increased sob or doe, wheezing, orthopnea, PND, increased LE swelling, palpitations, dizziness or syncope.  Work full time, 80 yo son, single mom.  Has occas HA's with nausea, dizzy, right side, pounding like, vomited and photophobia.  +  Also - - has Mild worsening depression symtpoms but no SI or HI, has worening stress recently.  Also has mild worsneing worsening reflux, but not abd pain, dysphagia, n/v, bowel change or blood.  Pt denies chest pain, increased sob or doe, wheezing, orthopnea, PND, increased LE swelling, palpitations, dizziness or syncope. Past Medical History  Diagnosis Date  . ANXIETY 01/27/2010  . CONSTIPATION 09/15/2009  . Anemia   . GERD (gastroesophageal reflux disease)   . IBS (irritable bowel syndrome)    No past surgical history on file.  reports that she has never smoked. She has never used smokeless tobacco. She reports that she drinks about 0.6 oz of alcohol per week. She reports that she does not use illicit drugs. family history includes Diabetes in her mother; Heart disease in her mother; Stroke in her maternal grandfather and maternal grandmother. There is no history of Colon cancer. No Known Allergies Current Outpatient Prescriptions on File Prior to Visit  Medication Sig Dispense Refill  . valACYclovir (VALTREX) 500 MG tablet Take 1 tablet (500 mg total) by mouth daily. 90 tablet 3  . azithromycin (ZITHROMAX Z-PAK) 250 MG tablet Use as directed (Patient not taking: Reported on 09/10/2014) 6 tablet 1   No current facility-administered medications on file prior to visit.   Review of Systems  Constitutional: Negative for unusual diaphoresis or night sweats HENT: Negative for ringing in ear or discharge Eyes: Negative for double vision or worsening visual disturbance.  Respiratory: Negative for choking and stridor.   Gastrointestinal: Negative for vomiting or other signifcant bowel change Genitourinary: Negative for hematuria or change in urine volume.  Musculoskeletal: Negative for other MSK pain or swelling Skin: Negative for color change and worsening wound.  Neurological: Negative for tremors and numbness other than noted  Psychiatric/Behavioral: Negative for decreased concentration or agitation other than above       Objective:   Physical Exam BP 124/86 mmHg  Pulse 84  Temp(Src) 97.9 F (36.6 C) (Oral)  Ht 5\' 8"  (1.727 m)  Wt 232 lb (105.235 kg)  BMI 35.28 kg/m2  SpO2 96% N/A  VS noted,  Constitutional: Pt appears in no significant distress HENT: Head: NCAT.  Right Ear: External ear normal.  Left Ear: External ear normal.  Eyes: . Pupils are equal, round, and reactive to light. Conjunctivae and EOM are normal Neck: Normal range of motion. Neck supple.  Cardiovascular: Normal rate and regular rhythm.   Pulmonary/Chest: Effort normal and breath sounds without rales or wheezing.  Abd:  Soft, NT, ND, + BS Neurological: Pt is alert. Not confused , motor grossly intact Skin: Skin is warm. No rash, no LE edema Psychiatric: Pt behavior is normal. No agitation. + midl nervous., + mild dysphoric affect    Assessment & Plan:

## 2014-09-11 ENCOUNTER — Other Ambulatory Visit (INDEPENDENT_AMBULATORY_CARE_PROVIDER_SITE_OTHER): Payer: No Typology Code available for payment source

## 2014-09-11 ENCOUNTER — Other Ambulatory Visit: Payer: Self-pay | Admitting: Internal Medicine

## 2014-09-11 DIAGNOSIS — R5383 Other fatigue: Secondary | ICD-10-CM | POA: Diagnosis not present

## 2014-09-11 DIAGNOSIS — Z202 Contact with and (suspected) exposure to infections with a predominantly sexual mode of transmission: Secondary | ICD-10-CM

## 2014-09-11 LAB — CBC WITH DIFFERENTIAL/PLATELET
BASOS PCT: 0.8 % (ref 0.0–3.0)
Basophils Absolute: 0 10*3/uL (ref 0.0–0.1)
Eosinophils Absolute: 0.1 10*3/uL (ref 0.0–0.7)
Eosinophils Relative: 2.3 % (ref 0.0–5.0)
HEMATOCRIT: 34.3 % — AB (ref 36.0–46.0)
HEMOGLOBIN: 11.3 g/dL — AB (ref 12.0–15.0)
LYMPHS PCT: 41.3 % (ref 12.0–46.0)
Lymphs Abs: 2.1 10*3/uL (ref 0.7–4.0)
MCHC: 32.8 g/dL (ref 30.0–36.0)
MCV: 90.7 fl (ref 78.0–100.0)
MONOS PCT: 9.1 % (ref 3.0–12.0)
Monocytes Absolute: 0.5 10*3/uL (ref 0.1–1.0)
Neutro Abs: 2.3 10*3/uL (ref 1.4–7.7)
Neutrophils Relative %: 46.5 % (ref 43.0–77.0)
Platelets: 281 10*3/uL (ref 150.0–400.0)
RBC: 3.79 Mil/uL — ABNORMAL LOW (ref 3.87–5.11)
RDW: 13.8 % (ref 11.5–15.5)
WBC: 5 10*3/uL (ref 4.0–10.5)

## 2014-09-11 LAB — HEPATIC FUNCTION PANEL
ALT: 12 U/L (ref 0–35)
AST: 13 U/L (ref 0–37)
Albumin: 4.1 g/dL (ref 3.5–5.2)
Alkaline Phosphatase: 47 U/L (ref 39–117)
BILIRUBIN TOTAL: 0.3 mg/dL (ref 0.2–1.2)
Bilirubin, Direct: 0.1 mg/dL (ref 0.0–0.3)
Total Protein: 7.3 g/dL (ref 6.0–8.3)

## 2014-09-11 LAB — URINALYSIS, ROUTINE W REFLEX MICROSCOPIC
Bilirubin Urine: NEGATIVE
KETONES UR: NEGATIVE
Leukocytes, UA: NEGATIVE
NITRITE: NEGATIVE
PH: 6 (ref 5.0–8.0)
TOTAL PROTEIN, URINE-UPE24: NEGATIVE
Urine Glucose: NEGATIVE
Urobilinogen, UA: 0.2 (ref 0.0–1.0)

## 2014-09-11 LAB — BASIC METABOLIC PANEL
BUN: 9 mg/dL (ref 6–23)
CO2: 26 mEq/L (ref 19–32)
Calcium: 9.4 mg/dL (ref 8.4–10.5)
Chloride: 106 mEq/L (ref 96–112)
Creatinine, Ser: 0.81 mg/dL (ref 0.40–1.20)
GFR: 110.55 mL/min (ref >60.00)
Glucose, Bld: 101 mg/dL — ABNORMAL HIGH (ref 70–99)
POTASSIUM: 3.9 meq/L (ref 3.5–5.1)
SODIUM: 138 meq/L (ref 135–145)

## 2014-09-11 LAB — TSH: TSH: 1.26 u[IU]/mL (ref 0.35–4.50)

## 2014-09-11 LAB — RPR

## 2014-09-12 ENCOUNTER — Telehealth: Payer: Self-pay | Admitting: Internal Medicine

## 2014-09-12 NOTE — Telephone Encounter (Signed)
Pt advised results are available for review on MyChart

## 2014-09-12 NOTE — Telephone Encounter (Signed)
Patient is requesting call back with recent lab results.

## 2014-09-13 LAB — GC/CHLAMYDIA PROBE AMP
CT Probe RNA: NEGATIVE
GC Probe RNA: NEGATIVE

## 2014-09-13 LAB — HIV ANTIBODY (ROUTINE TESTING W REFLEX): HIV 1&2 Ab, 4th Generation: NONREACTIVE

## 2014-09-30 ENCOUNTER — Encounter (HOSPITAL_BASED_OUTPATIENT_CLINIC_OR_DEPARTMENT_OTHER): Payer: Self-pay | Admitting: Emergency Medicine

## 2014-09-30 DIAGNOSIS — Z862 Personal history of diseases of the blood and blood-forming organs and certain disorders involving the immune mechanism: Secondary | ICD-10-CM | POA: Insufficient documentation

## 2014-09-30 DIAGNOSIS — X58XXXA Exposure to other specified factors, initial encounter: Secondary | ICD-10-CM | POA: Diagnosis not present

## 2014-09-30 DIAGNOSIS — F419 Anxiety disorder, unspecified: Secondary | ICD-10-CM | POA: Diagnosis not present

## 2014-09-30 DIAGNOSIS — S134XXA Sprain of ligaments of cervical spine, initial encounter: Secondary | ICD-10-CM | POA: Diagnosis not present

## 2014-09-30 DIAGNOSIS — Y9289 Other specified places as the place of occurrence of the external cause: Secondary | ICD-10-CM | POA: Diagnosis not present

## 2014-09-30 DIAGNOSIS — S199XXA Unspecified injury of neck, initial encounter: Secondary | ICD-10-CM | POA: Diagnosis present

## 2014-09-30 DIAGNOSIS — Y998 Other external cause status: Secondary | ICD-10-CM | POA: Insufficient documentation

## 2014-09-30 DIAGNOSIS — K219 Gastro-esophageal reflux disease without esophagitis: Secondary | ICD-10-CM | POA: Diagnosis not present

## 2014-09-30 DIAGNOSIS — Y9389 Activity, other specified: Secondary | ICD-10-CM | POA: Diagnosis not present

## 2014-09-30 DIAGNOSIS — Z79899 Other long term (current) drug therapy: Secondary | ICD-10-CM | POA: Insufficient documentation

## 2014-09-30 NOTE — ED Notes (Signed)
C/o neck pain and burning after carrying groceries and son states she turned her head in a quick motion when pain began. Denies N/V

## 2014-10-01 ENCOUNTER — Emergency Department (HOSPITAL_BASED_OUTPATIENT_CLINIC_OR_DEPARTMENT_OTHER)
Admission: EM | Admit: 2014-10-01 | Discharge: 2014-10-01 | Disposition: A | Discharge status: Home / Self Care | Payer: No Typology Code available for payment source | Attending: Emergency Medicine | Admitting: Emergency Medicine

## 2014-10-01 DIAGNOSIS — S139XXA Sprain of joints and ligaments of unspecified parts of neck, initial encounter: Secondary | ICD-10-CM

## 2014-10-01 MED ORDER — METHOCARBAMOL 500 MG PO TABS
500.0000 mg | ORAL_TABLET | Freq: Once | ORAL | Status: AC
  Administered 2014-10-01: 500 mg via ORAL
  Filled 2014-10-01: qty 1

## 2014-10-01 MED ORDER — IBUPROFEN 600 MG PO TABS
600.0000 mg | ORAL_TABLET | Freq: Four times a day (QID) | ORAL | Status: DC | PRN
Start: 2014-10-01 — End: 2016-08-03

## 2014-10-01 MED ORDER — METHOCARBAMOL 500 MG PO TABS: 1000.0000 mg | ORAL_TABLET | Freq: Three times a day (TID) | ORAL | Status: DC | PRN

## 2014-10-01 MED ORDER — IBUPROFEN 400 MG PO TABS
600.0000 mg | ORAL_TABLET | Freq: Once | ORAL | Status: AC
  Administered 2014-10-01: 600 mg via ORAL
  Filled 2014-10-01 (×2): qty 1

## 2014-10-01 NOTE — ED Provider Notes (Signed)
CSN: [id number]     Arrival date & time 09/30/14  2227 History   First MD Initiated Contact with Patient 10/01/14 0014     Chief Complaint  Patient presents with  . Neck Pain     (Consider location/radiation/quality/duration/timing/severity/associated sxs/prior Treatment) HPI Patient presents with left-sided neck pain after turning her head quickly to the right. She described the pain as sharp and radiating up into her left posterior scalp. This lasted about 30 seconds. Since resolved. Patient has a dull ache to the left side of her neck. She has no focal weakness or numbness. Denies headache. Past Medical History  Diagnosis Date  . ANXIETY 01/27/2010  . CONSTIPATION 09/15/2009  . Anemia   . GERD (gastroesophageal reflux disease)   . IBS (irritable bowel syndrome)    History reviewed. No pertinent past surgical history. Family History  Problem Relation Age of Onset  . Heart disease Mother     Pacemaker   . Diabetes Mother   . Colon cancer Neg Hx   . Stroke Maternal Grandmother   . Stroke Maternal Grandfather    History  Substance Use Topics  . Smoking status: Never Smoker   . Smokeless tobacco: Never Used  . Alcohol Use: 0.6 oz/week    1 Glasses of wine per week   OB History    Gravida Para Term Preterm AB TAB SAB Ectopic Multiple Living   3 1 1  0 1 1 0 0 0 1     Review of Systems  Constitutional: Negative for fever and chills.  Eyes: Negative for visual disturbance.  Respiratory: Negative for shortness of breath.   Cardiovascular: Negative for chest pain.  Gastrointestinal: Negative for nausea and vomiting.  Musculoskeletal: Positive for neck pain. Negative for back pain, arthralgias and neck stiffness.  Skin: Negative for rash and wound.  Neurological: Positive for headaches. Negative for dizziness, syncope, weakness, light-headedness and numbness.  All other systems reviewed and are negative.     Allergies  Review of patient's allergies indicates no known  allergies.  Home Medications   Prior to Admission medications   Medication Sig Start Date End Date Taking? Authorizing Provider  escitalopram (LEXAPRO) 10 MG tablet Take 1 tablet (10 mg total) by mouth daily. 09/10/14 12/09/14 Yes Corwin LevinsJames W John, MD  valACYclovir (VALTREX) 500 MG tablet Take 1 tablet (500 mg total) by mouth daily. 09/04/13  Yes Corwin LevinsJames W John, MD  ibuprofen (ADVIL,MOTRIN) 600 MG tablet Take 1 tablet (600 mg total) by mouth every 6 (six) hours as needed. 10/01/14   Loren Raceravid Denzil Bristol, MD  methocarbamol (ROBAXIN) 500 MG tablet Take 2 tablets (1,000 mg total) by mouth every 8 (eight) hours as needed for muscle spasms. 10/01/14   Loren Raceravid Safal Halderman, MD  pantoprazole (PROTONIX) 40 MG tablet Take 1 tablet (40 mg total) by mouth daily. 09/10/14   Corwin LevinsJames W John, MD  phentermine 37.5 MG capsule Take 1 capsule (37.5 mg total) by mouth every morning. 09/10/14   Corwin LevinsJames W John, MD  SUMAtriptan (IMITREX) 100 MG tablet Take 1 tablet (100 mg total) by mouth every 2 (two) hours as needed for migraine or headache. May repeat in 2 hours if headache persists or recurs. 09/10/14   Corwin LevinsJames W John, MD   BP 128/75 mmHg  Pulse 79  Temp(Src) 97.9 F (36.6 C) (Oral)  Resp 18  Ht 5' 7.5" (1.715 m)  Wt 223 lb (101.152 kg)  BMI 34.39 kg/m2  SpO2 100%  LMP 09/11/2014 Physical Exam  Constitutional: She is oriented  to person, place, and time. She appears well-developed and well-nourished. No distress.  HENT:  Head: Normocephalic and atraumatic.  Mouth/Throat: Oropharynx is clear and moist.  Eyes: EOM are normal. Pupils are equal, round, and reactive to light.  Neck: Normal range of motion. Neck supple.  Patient has mild left-sided paracervical tenderness to palpation. She has no midline posterior cervical tenderness to palpation. She has full range of motion of the neck. There is no meningismus.  Cardiovascular: Normal rate and regular rhythm.   Pulmonary/Chest: Effort normal and breath sounds normal. No respiratory  distress. She has no wheezes. She has no rales.  Abdominal: Soft. Bowel sounds are normal.  Musculoskeletal: Normal range of motion. She exhibits no edema or tenderness.  Neurological: She is alert and oriented to person, place, and time.  5/5 motor in all extremities. Sensation is fully intact. And related without difficulty.  Skin: Skin is warm and dry. No rash noted. No erythema.  Psychiatric: She has a normal mood and affect. Her behavior is normal.  Nursing note and vitals reviewed.   ED Course  Procedures (including critical care time) Labs Review Labs Reviewed - No data to display  Imaging Review No results found.   EKG Interpretation None      MDM   Final diagnoses:  Cervical sprain, initial encounter    Patient with a normal neurologic exam. She has no midline cervical tenderness. Do not believe imaging is necessary at this point. Treat symptomatically. Return precautions given.    Loren Racer, MD 10/01/14 [id number]

## 2014-10-01 NOTE — Discharge Instructions (Signed)

## 2014-10-13 ENCOUNTER — Telehealth: Payer: Self-pay | Admitting: Gastroenterology

## 2014-10-13 ENCOUNTER — Telehealth: Payer: Self-pay | Admitting: Internal Medicine

## 2014-10-13 MED ORDER — LINACLOTIDE 145 MCG PO CAPS: 145.0000 ug | ORAL_CAPSULE | Freq: Every day | ORAL | Status: DC

## 2014-10-13 NOTE — Telephone Encounter (Signed)
Pharmacy called and patient is trying to get a refill for LINZESS 290 MCG CAPS capsule [[id number]. (which is on her old med list). Insurance will only cover 90 day scripts [facility] on [address]

## 2014-10-13 NOTE — Telephone Encounter (Signed)
After checking this med on patient med list, linzess was sent to pharm by patty lewis/cma today

## 2014-10-13 NOTE — Telephone Encounter (Signed)
Patient has been out of this seen Thursday.  She needs this med filled today if possible.  Please follow up with patient.  Please give patient a call back at [phone].  Please leave message if patient does not answer.  Patient states she called in about this this morning and was told to get with her GI doctor to get refilled but GI can not refill because GI did not prescribe.

## 2014-10-13 NOTE — Telephone Encounter (Signed)
Pt notified rx sent.

## 2014-10-14 ENCOUNTER — Other Ambulatory Visit: Payer: Self-pay | Admitting: Internal Medicine

## 2015-01-29 ENCOUNTER — Ambulatory Visit (INDEPENDENT_AMBULATORY_CARE_PROVIDER_SITE_OTHER): Payer: No Typology Code available for payment source | Admitting: Internal Medicine

## 2015-01-29 ENCOUNTER — Encounter: Payer: Self-pay | Admitting: Internal Medicine

## 2015-01-29 VITALS — BP 118/82 | HR 86 | Temp 98.7°F | Ht 68.0 in | Wt 226.0 lb

## 2015-01-29 DIAGNOSIS — F418 Other specified anxiety disorders: Secondary | ICD-10-CM

## 2015-01-29 DIAGNOSIS — F329 Major depressive disorder, single episode, unspecified: Secondary | ICD-10-CM

## 2015-01-29 DIAGNOSIS — K219 Gastro-esophageal reflux disease without esophagitis: Secondary | ICD-10-CM

## 2015-01-29 DIAGNOSIS — N644 Mastodynia: Secondary | ICD-10-CM

## 2015-01-29 DIAGNOSIS — F419 Anxiety disorder, unspecified: Secondary | ICD-10-CM

## 2015-01-29 NOTE — Assessment & Plan Note (Signed)
With tender mult variable sized lumps bilat breasts symmetrically it seems for at least 1 mo, no skin change or nipple d/c. Suspect cystic disease, not likely malignant, ? Benefit from needle decompression of any symptomatic cyst - for diag mammogram with u/s, alleve prn

## 2015-01-29 NOTE — Assessment & Plan Note (Signed)
stable overall by history and exam, recent data reviewed with pt, and pt to continue medical treatment as before,  to f/u any worsening symptoms or concerns' Lab Results  Component Value Date   WBC 5.0 09/11/2014   HGB 11.3* 09/11/2014   HCT 34.3* 09/11/2014   PLT 281.0 09/11/2014   GLUCOSE 101* 09/11/2014   ALT 12 09/11/2014   AST 13 09/11/2014   NA 138 09/11/2014   K 3.9 09/11/2014   CL 106 09/11/2014   CREATININE 0.81 09/11/2014   BUN 9 09/11/2014   CO2 26 09/11/2014   TSH 1.26 09/11/2014

## 2015-01-29 NOTE — Assessment & Plan Note (Signed)
stable overall by history and exam, recent data reviewed with pt, and pt to continue medical treatment as before,  to f/u any worsening symptoms or concerns  

## 2015-01-29 NOTE — Progress Notes (Signed)
Pre visit review using our clinic review tool, if applicable. No additional management support is needed unless otherwise documented below in the visit note. 

## 2015-01-29 NOTE — Patient Instructions (Signed)
OK to use tylenol or alleve for pain for now  You will be contacted regarding the referral for: Diagnostic mammogram ([facility] with an ultrasound[facility]  Please continue all other medications as before, and refills have been done if requested.  Please have the pharmacy call with any other refills you may need.  Please keep your appointments with your specialists as you may have planned

## 2015-01-29 NOTE — Progress Notes (Signed)
Subjective:    Patient ID: Linda Fuller, female    DOB: 12/11/1990, 24 y.o.   MRN: [id number]  HPI  Here with bilat breast pain, 1 mo, intermittent, mild, sharp but sometimes dull and sore , had to sleep with bra on for comfort, no skin changes, no trauma, no fever, no nipple d/c.  Not had this kind of pain before. No ST, cough, and Pt denies chest pain, increased sob or doe, wheezing, orthopnea, PND, increased LE swelling, palpitations, dizziness or syncope. LMP x 2 wks, usual for you , same pads, same days. No family history of pain.  Has been doind self exam, ? Lumps soreness. Friend died at 27yo with stage 4 breast ca. Denies worsening reflux, abd pain, dysphagia, n/v, bowel change or blood.  Denies worsening depressive symptoms, suicidal ideation, or panic; has ongoing anxiety, mild today it seems Past Medical History  Diagnosis Date  . ANXIETY 01/27/2010  . CONSTIPATION 09/15/2009  . Anemia   . GERD (gastroesophageal reflux disease)   . IBS (irritable bowel syndrome)    No past surgical history on file.  reports that she has never smoked. She has never used smokeless tobacco. She reports that she drinks about 0.6 oz of alcohol per week. She reports that she does not use illicit drugs. family history includes Diabetes in her mother; Heart disease in her mother; Stroke in her maternal grandfather and maternal grandmother. There is no history of Colon cancer. No Known Allergies Current Outpatient Prescriptions on File Prior to Visit  Medication Sig Dispense Refill  . LINZESS 290 MCG CAPS capsule TAKE ONE CAPSULE BY MOUTH EVERY DAY 30 capsule 5  . valACYclovir (VALTREX) 500 MG tablet Take 1 tablet (500 mg total) by mouth daily. 90 tablet 3  . escitalopram (LEXAPRO) 10 MG tablet Take 1 tablet (10 mg total) by mouth daily. 90 tablet 3  . ibuprofen (ADVIL,MOTRIN) 600 MG tablet Take 1 tablet (600 mg total) by mouth every 6 (six) hours as needed. (Patient not taking: Reported on 01/29/2015) 30  tablet 0  . Linaclotide (LINZESS) 145 MCG CAPS capsule Take 1 capsule (145 mcg total) by mouth daily. (Patient not taking: Reported on 01/29/2015) 30 capsule 3  . methocarbamol (ROBAXIN) 500 MG tablet Take 2 tablets (1,000 mg total) by mouth every 8 (eight) hours as needed for muscle spasms. (Patient not taking: Reported on 01/29/2015) 30 tablet 0  . pantoprazole (PROTONIX) 40 MG tablet Take 1 tablet (40 mg total) by mouth daily. (Patient not taking: Reported on 01/29/2015) 90 tablet 3  . phentermine 37.5 MG capsule Take 1 capsule (37.5 mg total) by mouth every morning. (Patient not taking: Reported on 01/29/2015) 30 capsule 2  . SUMAtriptan (IMITREX) 100 MG tablet Take 1 tablet (100 mg total) by mouth every 2 (two) hours as needed for migraine or headache. May repeat in 2 hours if headache persists or recurs. (Patient not taking: Reported on 01/29/2015) 10 tablet 5   No current facility-administered medications on file prior to visit.   . Review of Systems  Constitutional: Negative for unusual diaphoresis or night sweats HENT: Negative for ringing in ear or discharge Eyes: Negative for double vision or worsening visual disturbance.  Respiratory: Negative for choking and stridor.   Gastrointestinal: Negative for vomiting or other signifcant bowel change Genitourinary: Negative for hematuria or change in urine volume.  Musculoskeletal: Negative for other MSK pain or swelling Skin: Negative for color change and worsening wound.  Neurological: Negative for tremors and  numbness other than noted  Psychiatric/Behavioral: Negative for decreased concentration or agitation other than above       Objective:   Physical Exam BP 118/82 mmHg  Pulse 86  Temp(Src) 98.7 F (37.1 C) (Oral)  Ht  (1.727 m)  Wt 226 lb (102.513 kg)  BMI 34.37 kg/m2  SpO2 98%  LMP 01/10/2015 VS noted,  Constitutional: Pt appears in no significant distress HENT: Head: NCAT.  Right Ear: External ear normal.  Left Ear:  External ear normal.  Eyes: . Pupils are equal, round, and reactive to light. Conjunctivae and EOM are normal Right breast: has nondiscrete diffuse subq lumps multiple primarily to both breasts surprisinly symmteric at the upper medial and upper lateral quads, no skin change, no nipple d/c Neck: Normal range of motion. Neck supple.  Cardiovascular: Normal rate and regular rhythm.   Pulmonary/Chest: Effort normal and breath sounds without rales or wheezing.  Abd:  Soft, NT, ND, + BS Neurological: Pt is alert. Not confused , motor grossly intact Skin: Skin is warm. No rash, no LE edema Psychiatric: Pt behavior is normal. No agitation. mild nervous     Assessment & Plan:

## 2015-01-30 ENCOUNTER — Ambulatory Visit
Admission: RE | Admit: 2015-01-30 | Discharge: 2015-01-30 | Disposition: A | Discharge status: Home / Self Care | Payer: No Typology Code available for payment source | Source: Ambulatory Visit | Attending: Internal Medicine | Admitting: Internal Medicine

## 2015-01-30 ENCOUNTER — Other Ambulatory Visit: Payer: Self-pay | Admitting: Internal Medicine

## 2015-01-30 DIAGNOSIS — N644 Mastodynia: Secondary | ICD-10-CM

## 2015-03-27 ENCOUNTER — Telehealth: Payer: Self-pay

## 2015-03-27 NOTE — Telephone Encounter (Signed)
Left message advising patient that they can call back to schedule nurse visit for flu vaccine

## 2015-04-29 ENCOUNTER — Encounter: Payer: Self-pay | Admitting: Internal Medicine

## 2015-04-29 ENCOUNTER — Ambulatory Visit (INDEPENDENT_AMBULATORY_CARE_PROVIDER_SITE_OTHER): Payer: No Typology Code available for payment source | Admitting: Internal Medicine

## 2015-04-29 VITALS — BP 118/76 | HR 84 | Temp 98.4°F | Resp 20 | Wt 229.0 lb

## 2015-04-29 DIAGNOSIS — L309 Dermatitis, unspecified: Secondary | ICD-10-CM | POA: Diagnosis not present

## 2015-04-29 DIAGNOSIS — F418 Other specified anxiety disorders: Secondary | ICD-10-CM

## 2015-04-29 DIAGNOSIS — J029 Acute pharyngitis, unspecified: Secondary | ICD-10-CM | POA: Diagnosis not present

## 2015-04-29 DIAGNOSIS — F329 Major depressive disorder, single episode, unspecified: Secondary | ICD-10-CM

## 2015-04-29 DIAGNOSIS — F419 Anxiety disorder, unspecified: Secondary | ICD-10-CM

## 2015-04-29 MED ORDER — AZITHROMYCIN 250 MG PO TABS
ORAL_TABLET | ORAL | Status: DC
Start: 2015-04-29 — End: 2015-10-06

## 2015-04-29 MED ORDER — TRIAMCINOLONE ACETONIDE 0.1 % EX CREA: 1.0000 "application " | TOPICAL_CREAM | Freq: Two times a day (BID) | CUTANEOUS | Status: DC

## 2015-04-29 NOTE — Progress Notes (Signed)
Pre visit review using our clinic review tool, if applicable. No additional management support is needed unless otherwise documented below in the visit note. 

## 2015-04-29 NOTE — Patient Instructions (Signed)
Please take all new medication as prescribed - the antibiotic, and the steroid cream  Please continue all other medications as before, and refills have been done if requested.  Please have the pharmacy call with any other refills you may need.  Please keep your appointments with your specialists as you may have planned

## 2015-04-30 NOTE — Assessment & Plan Note (Signed)
Mod to severe, for antibx course,  to f/u any worsening symptoms or concerns 

## 2015-04-30 NOTE — Progress Notes (Signed)
Subjective:    Patient ID: Linda Fuller, female    DOB: [birth year], 25 y.o.   MRN: [id number]  HPI   Here with 2-3 days acute onset fever, severe ST pain, pressure, headache, general weakness and malaise, and non prod cough, but pt denies chest pain, wheezing, increased sob or doe, orthopnea, PND, increased LE swelling, palpitations, dizziness or syncope.  Also with rash to inner elbows bilat, hx of eczema, worse with more scratching, no other rash or contact allergens. Denies worsening depressive symptoms, suicidal ideation, or panic Past Medical History  Diagnosis Date  . ANXIETY 01/27/2010  . CONSTIPATION 09/15/2009  . Anemia   . GERD (gastroesophageal reflux disease)   . IBS (irritable bowel syndrome)    No past surgical history on file.  reports that she has never smoked. She has never used smokeless tobacco. She reports that she drinks about 0.6 oz of alcohol per week. She reports that she does not use illicit drugs. family history includes Diabetes in her mother; Heart disease in her mother; Stroke in her maternal grandfather and maternal grandmother. There is no history of Colon cancer. No Known Allergies Current Outpatient Prescriptions on File Prior to Visit  Medication Sig Dispense Refill  . ibuprofen (ADVIL,MOTRIN) 600 MG tablet Take 1 tablet (600 mg total) by mouth every 6 (six) hours as needed. 30 tablet 0  . Linaclotide (LINZESS) 145 MCG CAPS capsule Take 1 capsule (145 mcg total) by mouth daily. 30 capsule 3  . LINZESS 290 MCG CAPS capsule TAKE ONE CAPSULE BY MOUTH EVERY DAY 30 capsule 5  . methocarbamol (ROBAXIN) 500 MG tablet Take 2 tablets (1,000 mg total) by mouth every 8 (eight) hours as needed for muscle spasms. 30 tablet 0  . pantoprazole (PROTONIX) 40 MG tablet Take 1 tablet (40 mg total) by mouth daily. 90 tablet 3  . phentermine 37.5 MG capsule Take 1 capsule (37.5 mg total) by mouth every morning. 30 capsule 2  . SUMAtriptan (IMITREX) 100 MG tablet Take 1 tablet  (100 mg total) by mouth every 2 (two) hours as needed for migraine or headache. May repeat in 2 hours if headache persists or recurs. 10 tablet 5  . valACYclovir (VALTREX) 500 MG tablet Take 1 tablet (500 mg total) by mouth daily. 90 tablet 3  . escitalopram (LEXAPRO) 10 MG tablet Take 1 tablet (10 mg total) by mouth daily. 90 tablet 3   No current facility-administered medications on file prior to visit.   Review of Systems  Constitutional: Negative for unusual diaphoresis or night sweats HENT: Negative for ringing in ear or discharge Eyes: Negative for double vision or worsening visual disturbance.  Respiratory: Negative for choking and stridor.   Gastrointestinal: Negative for vomiting or other signifcant bowel change Genitourinary: Negative for hematuria or change in urine volume.  Musculoskeletal: Negative for other MSK pain or swelling Skin: Negative for color change and worsening wound.  Neurological: Negative for tremors and numbness other than noted  Psychiatric/Behavioral: Negative for decreased concentration or agitation other than above       Objective:   Physical Exam BP 118/76 mmHg  Pulse 84  Temp(Src) 98.4 F (36.9 C) (Oral)  Resp 20  Wt 229 lb (103.874 kg)  SpO2 96% VS noted,  Constitutional: Pt appears in no significant distress HENT: Head: NCAT.  Right Ear: External ear normal.  Left Ear: External ear normal.  Eyes: . Pupils are equal, round, and reactive to light. Conjunctivae and EOM are normal Bilat tm's  with mild erythema.  Max sinus areas non tender.  Pharynx with severe erythema, + exudate Neck: Normal range of motion. Neck supple.  Cardiovascular: Normal rate and regular rhythm.   Pulmonary/Chest: Effort normal and breath sounds without rales or wheezing.  Neurological: Pt is alert. Not confused , motor grossly intact Skin: Skin is warm.+ eczematous rash to bilat antecubital areas approx 2x 1 cm areas and nontender, no LE edema Psychiatric: Pt behavior  is normal. No agitation. not depressed affect    Assessment & Plan:

## 2015-04-30 NOTE — Assessment & Plan Note (Signed)
stable overall by history and exam, recent data reviewed with pt, and pt to continue medical treatment as before,  to f/u any worsening symptoms or concerns' Lab Results  Component Value Date   WBC 5.0 09/11/2014   HGB 11.3* 09/11/2014   HCT 34.3* 09/11/2014   PLT 281.0 09/11/2014   GLUCOSE 101* 09/11/2014   ALT 12 09/11/2014   AST 13 09/11/2014   NA 138 09/11/2014   K 3.9 09/11/2014   CL 106 09/11/2014   CREATININE 0.81 09/11/2014   BUN 9 09/11/2014   CO2 26 09/11/2014   TSH 1.26 09/11/2014    

## 2015-04-30 NOTE — Assessment & Plan Note (Signed)
Mild to mod, for topical steroid prn,  to f/u any worsening symptoms or concerns 

## 2015-06-09 ENCOUNTER — Telehealth: Payer: Self-pay

## 2015-06-09 NOTE — Telephone Encounter (Signed)
LVM for pt to call back as soon as possible.   RE: Flu vaccine for 2016-2017 

## 2015-06-15 NOTE — Telephone Encounter (Signed)
Pt called back and pt refused to get one. She states she hasn't had one in years

## 2015-06-15 NOTE — Telephone Encounter (Signed)
Documented in pt chart.

## 2015-09-03 ENCOUNTER — Telehealth: Payer: Self-pay | Admitting: Internal Medicine

## 2015-09-03 NOTE — Telephone Encounter (Signed)
Patient called to advise that she wanted to make sure that this message was sent because the call was dropped. She is also asking if there us something that she can take OTC in the meantime, while she is waiting. Please give the patient a call to advise of any OTC options in the meantime. Secure vm

## 2015-09-03 NOTE — Telephone Encounter (Signed)
Patient is requesting refill on probiotic.  Patient states she needs this before Dr. Jonny RuizJohn can get back.

## 2015-09-04 NOTE — Telephone Encounter (Signed)
Patient advised.

## 2015-09-04 NOTE — Telephone Encounter (Signed)
Can you recommend probiotic OTC in dr Raphael Gibneyjohn's absence, i will call patient back, thanks

## 2015-09-04 NOTE — Telephone Encounter (Signed)
Any OTC probitoic is fine including Activia, Nurse, learning disabilityAlign or Florastar.

## 2015-10-06 ENCOUNTER — Encounter: Payer: Self-pay | Admitting: Internal Medicine

## 2015-10-06 ENCOUNTER — Ambulatory Visit (INDEPENDENT_AMBULATORY_CARE_PROVIDER_SITE_OTHER): Payer: Self-pay | Admitting: Internal Medicine

## 2015-10-06 VITALS — BP 130/70 | HR 82 | Temp 98.3°F | Resp 20 | Wt 226.0 lb

## 2015-10-06 DIAGNOSIS — K219 Gastro-esophageal reflux disease without esophagitis: Secondary | ICD-10-CM

## 2015-10-06 DIAGNOSIS — F418 Other specified anxiety disorders: Secondary | ICD-10-CM

## 2015-10-06 DIAGNOSIS — F419 Anxiety disorder, unspecified: Secondary | ICD-10-CM

## 2015-10-06 DIAGNOSIS — E669 Obesity, unspecified: Secondary | ICD-10-CM

## 2015-10-06 DIAGNOSIS — F329 Major depressive disorder, single episode, unspecified: Secondary | ICD-10-CM

## 2015-10-06 DIAGNOSIS — R1011 Right upper quadrant pain: Secondary | ICD-10-CM

## 2015-10-06 MED ORDER — PHENTERMINE HCL 37.5 MG PO CAPS: 37.5000 mg | ORAL_CAPSULE | ORAL | Status: DC

## 2015-10-06 MED ORDER — PANTOPRAZOLE SODIUM 40 MG PO TBEC: 40.0000 mg | DELAYED_RELEASE_TABLET | Freq: Every day | ORAL | Status: DC

## 2015-10-06 NOTE — Patient Instructions (Signed)
Please take all new medication as prescribed  - the antacid (protonix), and the phentermine  Please continue all other medications as before, and refills have been done if requested.  Please have the pharmacy call with any other refills you may need.  Please keep your appointments with your specialists as you may have planned  Please go to the LAB in the Basement (turn left off the elevator) for the tests to be done tomorrow or soon  You will be contacted by phone if any changes need to be made immediately.  Otherwise, you will receive a letter about your results with an explanation, but please check with MyChart first.  Please remember to sign up for MyChart if you have not done so, as this will be important to you in the future with finding out test results, communicating by private email, and scheduling acute appointments online when needed.

## 2015-10-06 NOTE — Progress Notes (Signed)
Pre visit review using our clinic review tool, if applicable. No additional management support is needed unless otherwise documented below in the visit note. 

## 2015-10-07 ENCOUNTER — Ambulatory Visit
Admission: RE | Admit: 2015-10-07 | Discharge: 2015-10-07 | Disposition: A | Discharge status: Home / Self Care | Payer: 59 | Source: Ambulatory Visit | Attending: Internal Medicine | Admitting: Internal Medicine

## 2015-10-07 DIAGNOSIS — R1011 Right upper quadrant pain: Secondary | ICD-10-CM

## 2015-10-07 NOTE — Assessment & Plan Note (Addendum)
For increased activity, reduced calories, also phentermine asd

## 2015-10-07 NOTE — Assessment & Plan Note (Signed)
Ruq/right costal margin, suspect MSK due to obesity, but cant r/o other, for PPI trial, abd u/s, labs as ordered, bland diet and reduced calories, increased activity

## 2015-10-07 NOTE — Assessment & Plan Note (Signed)
For anti reflux precautions, also PPI trial

## 2015-10-07 NOTE — Assessment & Plan Note (Signed)
Mild, declines need for counseling or other tx,  to f/u any worsening symptoms or concerns

## 2015-10-07 NOTE — Progress Notes (Signed)
Subjective:    Patient ID: Linda Fuller, female    DOB: 09/23/1990, 25 y.o.   MRN: [id number]  HPI  Here with abd pain that seems different than her IBS type.  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  No fever and Denies worsening reflux, other abd pain, dysphagia, other bowel change or blood.  No recent wt loss or reduced appetite, in fact is trying to lose wt but just doesn't seem to work, did not take prior phentermine, asks for rx.  No raidation of pain, nothing else makes better or worse.  No recent nsaid use, ETOH, heavy lifting or bending or hx of PUD.  No recent EGD.  Not pregnant, LMP last wk.  Has not tried antacid.   Past Medical History  Diagnosis Date  . ANXIETY 01/27/2010  . CONSTIPATION 09/15/2009  . Anemia   . GERD (gastroesophageal reflux disease)   . IBS (irritable bowel syndrome)    No past surgical history on file.  reports that she has never smoked. She has never used smokeless tobacco. She reports that she drinks about 0.6 oz of alcohol per week. She reports that she does not use illicit drugs. family history includes Diabetes in her mother; Heart disease in her mother; Stroke in her maternal grandfather and maternal grandmother. There is no history of Colon cancer. No Known Allergies Current Outpatient Prescriptions on File Prior to Visit  Medication Sig Dispense Refill  . ibuprofen (ADVIL,MOTRIN) 600 MG tablet Take 1 tablet (600 mg total) by mouth every 6 (six) hours as needed. 30 tablet 0  . Linaclotide (LINZESS) 145 MCG CAPS capsule Take 1 capsule (145 mcg total) by mouth daily. 30 capsule 3  . LINZESS 290 MCG CAPS capsule TAKE ONE CAPSULE BY MOUTH EVERY DAY 30 capsule 5  . methocarbamol (ROBAXIN) 500 MG tablet Take 2 tablets (1,000 mg total) by mouth every 8 (eight) hours as needed for muscle spasms. 30 tablet 0   . SUMAtriptan (IMITREX) 100 MG tablet Take 1 tablet (100 mg total) by mouth every 2 (two) hours as needed for migraine or headache. May repeat in 2 hours if headache persists or recurs. 10 tablet 5  . triamcinolone cream (KENALOG) 0.1 % Apply 1 application topically 2 (two) times daily. 30 g 0  . valACYclovir (VALTREX) 500 MG tablet Take 1 tablet (500 mg total) by mouth daily. 90 tablet 3  . escitalopram (LEXAPRO) 10 MG tablet Take 1 tablet (10 mg total) by mouth daily. 90 tablet 3   No current facility-administered medications on file prior to visit.   Review of Systems  Constitutional: Negative for unusual diaphoresis or night sweats HENT: Negative for ear swelling or discharge Eyes: Negative for worsening visual haziness  Respiratory: Negative for choking and stridor.   Gastrointestinal: Negative for distension or worsening eructation Genitourinary: Negative for retention or change in urine volume.  Musculoskeletal: Negative for other MSK pain or swelling Skin: Negative for color change and worsening wound Neurological: Negative for tremors and numbness other than noted  Psychiatric/Behavioral: Negative for decreased concentration or agitation other than above       Objective:   Physical Exam BP 130/70 mmHg  Pulse 82  Temp(Src) 98.3 F (36.8 C) (Oral)  Resp 20  Wt 226 lb (102.513 kg)  SpO2 90% VS noted,  Constitutional: Pt  appears in no apparent distress HENT: Head: NCAT.  Right Ear: External ear normal.  Left Ear: External ear normal.  Eyes: . Pupils are equal, round, and reactive to light. Conjunctivae and EOM are normal Neck: Normal range of motion. Neck supple.  Cardiovascular: Normal rate and regular rhythm.   Pulmonary/Chest: Effort normal and breath sounds without rales or wheezing.  Abd:  Soft, NT, ND, + BS, does have right costal margin tenderness Neurological: Pt is alert. Not confused , motor grossly intact Skin: Skin is warm. No rash, no LE edema Psychiatric:  Pt behavior is normal. No agitation., mild nervous, not depressed affect      Assessment & Plan:

## 2016-02-15 ENCOUNTER — Other Ambulatory Visit: Payer: Self-pay | Admitting: Internal Medicine

## 2016-02-16 ENCOUNTER — Telehealth: Payer: Self-pay | Admitting: Internal Medicine

## 2016-02-16 NOTE — Telephone Encounter (Signed)
pls advise at your convenience

## 2016-02-16 NOTE — Telephone Encounter (Signed)
Yes, I do Come get them

## 2016-02-16 NOTE — Telephone Encounter (Signed)
Pt called in said that her ins has not kicked in yet and she would like to know if Dr Jonny Ruizjohn has sample of LINZESS 290 MCG CAPS capsule [[id number]][[id number]]  . It is going to be $400.00 without ins.

## 2016-02-17 NOTE — Telephone Encounter (Signed)
Corrine given samples.

## 2016-05-07 ENCOUNTER — Emergency Department (HOSPITAL_BASED_OUTPATIENT_CLINIC_OR_DEPARTMENT_OTHER)
Admission: EM | Admit: 2016-05-07 | Discharge: 2016-05-07 | Disposition: A | Discharge status: Home / Self Care | Payer: Managed Care, Other (non HMO) | Attending: Emergency Medicine | Admitting: Emergency Medicine

## 2016-05-07 ENCOUNTER — Encounter (HOSPITAL_BASED_OUTPATIENT_CLINIC_OR_DEPARTMENT_OTHER): Payer: Self-pay | Admitting: *Deleted

## 2016-05-07 DIAGNOSIS — R42 Dizziness and giddiness: Secondary | ICD-10-CM

## 2016-05-07 DIAGNOSIS — Z79899 Other long term (current) drug therapy: Secondary | ICD-10-CM | POA: Insufficient documentation

## 2016-05-07 DIAGNOSIS — H6502 Acute serous otitis media, left ear: Secondary | ICD-10-CM | POA: Diagnosis not present

## 2016-05-07 MED ORDER — MECLIZINE HCL 25 MG PO TABS: 25.0000 mg | ORAL_TABLET | Freq: Three times a day (TID) | ORAL | 0 refills | Status: DC | PRN

## 2016-05-07 MED ORDER — FLUTICASONE PROPIONATE 50 MCG/ACT NA SUSP: NASAL | 1 refills | Status: DC

## 2016-05-07 MED ORDER — MECLIZINE HCL 25 MG PO TABS
25.0000 mg | ORAL_TABLET | Freq: Once | ORAL | Status: AC
  Administered 2016-05-07: 25 mg via ORAL
  Filled 2016-05-07: qty 1

## 2016-05-07 NOTE — ED Notes (Signed)
Pt given d/c instructions. Rx x 2. Verbalizes understanding. No questions. 

## 2016-05-07 NOTE — ED Provider Notes (Signed)
[facility] Provider Note   CSN: [id number] Arrival date & time: 05/07/16  1837     History   Chief Complaint Chief Complaint  Patient presents with  . Dizziness    HPI Linda Fuller is a 26 y.o. female. Chief complaint dizziness and ear fullness  HPI: Patient presents here with complaint of 3 days of symptoms. Left ear pain and pressure. Starting yesterday when she stands she feels dizziness described as a spinning sensation like she is going to end up on the floor.  URI symptoms with runny nose. States when she lays down she feels a swishing sensation in her ear. She states she laid differently last night to see if it would go away. She awakened this morning in her left arm and neck are somewhat stiff as well. This is improved as the day has gone by.  Past Medical History:  Diagnosis Date  . Anemia   . ANXIETY 01/27/2010  . CONSTIPATION 09/15/2009  . GERD (gastroesophageal reflux disease)   . IBS (irritable bowel syndrome)     Patient Active Problem List   Diagnosis Date Noted  . Abdominal pain, RUQ 10/07/2015  . Acute pharyngitis 04/29/2015  . Eczema 04/29/2015  . Painful breasts 01/29/2015  . Fatigue 09/10/2014  . Migraine 09/10/2014  . GERD (gastroesophageal reflux disease) 09/10/2014  . Obesity 09/10/2014  . STD exposure 09/10/2014  . Acute cystitis 11/12/2013  . Delayed menses 09/04/2013  . Chronic constipation 01/22/2013  . Hematochezia 01/22/2013  . Rash and nonspecific skin eruption 01/22/2013  . Genital herpes 06/12/2012  . Postpartum care following vaginal delivery (7/20) 10/15/2011  . Preventative health care 08/16/2010  . Anxiety and depression 01/27/2010  . BACK PAIN 10/03/2008    History reviewed. No pertinent surgical history.  OB History    Gravida Para Term Preterm AB Living   3 1 1  0 1 1   SAB TAB Ectopic Multiple Live Births   0 1 0 0 1       Home Medications    Prior to Admission medications   Medication Sig Start  Date End Date Taking? Authorizing Provider  fluticasone (FLONASE) 50 MCG/ACT nasal spray 1 spray each nares twice a day 05/07/16   Rolland PorterMark Danelle Curiale, MD  ibuprofen (ADVIL,MOTRIN) 600 MG tablet Take 1 tablet (600 mg total) by mouth every 6 (six) hours as needed. 10/01/14   Loren Raceravid Yelverton, MD  Linaclotide [facility](LINZESS) 145 MCG CAPS capsule Take 1 capsule (145 mcg total) by mouth daily. 10/13/14   Rachael Feeaniel P Jacobs, MD  meclizine (ANTIVERT) 25 MG tablet Take 1 tablet (25 mg total) by mouth 3 (three) times daily as needed. 05/07/16   Rolland PorterMark Deanndra Kirley, MD    Family History Family History  Problem Relation Age of Onset  . Heart disease Mother     Pacemaker   . Diabetes Mother   . Colon cancer Neg Hx   . Stroke Maternal Grandmother   . Stroke Maternal Grandfather     Social History Social History  Substance Use Topics  . Smoking status: Never Smoker  . Smokeless tobacco: Never Used  . Alcohol use 0.6 oz/week    1 Glasses of wine per week     Allergies   Patient has no known allergies.   Review of Systems Review of Systems  Constitutional: Negative for appetite change, chills, diaphoresis, fatigue and fever.  HENT: Positive for ear pain and hearing loss. Negative for mouth sores, sore throat and trouble swallowing.   Eyes:  Negative for visual disturbance.  Respiratory: Negative for cough, chest tightness, shortness of breath and wheezing.   Cardiovascular: Negative for chest pain.  Gastrointestinal: Negative for abdominal distention, abdominal pain, diarrhea, nausea and vomiting.  Endocrine: Negative for polydipsia, polyphagia and polyuria.  Genitourinary: Negative for dysuria, frequency and hematuria.  Musculoskeletal: Negative for gait problem.  Skin: Negative for color change, pallor and rash.  Neurological: Positive for dizziness. Negative for syncope, light-headedness and headaches.  Hematological: Does not bruise/bleed easily.  Psychiatric/Behavioral: Negative for behavioral problems and  confusion.     Physical Exam Updated Vital Signs BP 128/79 (BP Location: Right Arm)   Pulse 64   Temp 98.2 F (36.8 C) (Oral)   Resp 18   Ht 5\' 9"  (1.753 m)   Wt 215 lb (97.5 kg)   LMP 05/07/2016   SpO2 100%   BMI 31.75 kg/m   Physical Exam  Constitutional: She is oriented to person, place, and time. She appears well-developed and well-nourished. No distress.  HENT:  Head: Normocephalic.  Serous otitis with fluid meniscus left TM  Eyes: Conjunctivae are normal. Pupils are equal, round, and reactive to light. No scleral icterus.  Neck: Normal range of motion. Neck supple. No thyromegaly present.  Cardiovascular: Normal rate and regular rhythm.  Exam reveals no gallop and no friction rub.   No murmur heard. Pulmonary/Chest: Effort normal and breath sounds normal. No respiratory distress. She has no wheezes. She has no rales.  Abdominal: Soft. Bowel sounds are normal. She exhibits no distension. There is no tenderness. There is no rebound.  Musculoskeletal: Normal range of motion.  Neurological: She is alert and oriented to person, place, and time.  Nystagmus with lateral to lateral gaze.  Skin: Skin is warm and dry. No rash noted.  Psychiatric: She has a normal mood and affect. Her behavior is normal.     ED Treatments / Results  Labs (all labs ordered are listed, but only abnormal results are displayed) Labs Reviewed - No data to display  EKG  EKG Interpretation None       Radiology No results found.  Procedures Procedures (including critical care time)  Medications Ordered in ED Medications  meclizine (ANTIVERT) tablet 25 mg (not administered)     Initial Impression / Assessment and Plan / ED Course  I have reviewed the triage vital signs and the nursing notes.  Pertinent labs & imaging results that were available during my care of the patient were reviewed by me and considered in my medical decision making (see chart for details).     Vertigo.  Likely secondary to serous otitis. Plan Flonase Sudafed. Meclizine. Primary care follow-up.  Final Clinical Impressions(s) / ED Diagnoses   Final diagnoses:  Acute serous otitis media of left ear, recurrence not specified  Dizziness  Vertigo    New Prescriptions New Prescriptions   FLUTICASONE (FLONASE) 50 MCG/ACT NASAL SPRAY    1 spray each nares twice a day   MECLIZINE (ANTIVERT) 25 MG TABLET    Take 1 tablet (25 mg total) by mouth 3 (three) times daily as needed.     Rolland Porter, MD 05/07/16 2138

## 2016-05-07 NOTE — ED Triage Notes (Signed)
Pt reports bilateral ear pressure, HA x 1 month.  Reports chest pressure and left arm numbness that started tonight.

## 2016-05-07 NOTE — ED Notes (Signed)
Pt states she has had off and on episodes of the room spinning for a couple months. Worse past few days. Denies other s/s.

## 2016-05-07 NOTE — Discharge Instructions (Signed)
Sudafed morning, and early afternoon to help your ear decongest and drain to your throat. Flonase 1 spray each side of your nose twice per day. Meclizine as needed for dizziness. You can take every 6-8 hours as needed. May cause slight sedation and sleepiness

## 2016-05-09 ENCOUNTER — Telehealth: Payer: Self-pay | Admitting: Internal Medicine

## 2016-05-09 NOTE — Telephone Encounter (Signed)
Patient Name: Linda Fuller DOB: 06/12/1990 Initial Comment Caller states she was diagnosed with vertigo due to ear congestion at hospital, prescribed meds but has gotten worse Nurse Assessment Nurse: Lane HackerHarley, RN, Elvin SoWindy Date/Time (Eastern Time): 05/09/2016 2:15:33 PM Confirm and document reason for call. If symptomatic, describe symptoms. ---Caller states she was diagnosed in ER with vertigo due to ear congestion, prescribed meds - meclizine, but has gotten worse. Does the patient have any new or worsening symptoms? ---Yes Will a triage be completed? ---Yes Related visit to physician within the last 2 weeks? ---Yes Does the PT have any chronic conditions? (i.e. diabetes, asthma, etc.) ---No Is the patient pregnant or possibly pregnant? (Ask all females between the ages of 7212-55) ---No Is this a behavioral health or substance abuse call? ---No Guidelines Guideline Title Affirmed Question Affirmed Notes Dizziness - Vertigo Earache Final Disposition User See Physician within [address]24 Hours [place], [place], [place] Comments Also c/o ear pain. Rates HA pain now at 7/10. She wants to wait and see, and will call back tomorrow if not improved. She is taking Sudafed to help dry up the fluid in her ears. Referrals GO TO FACILITY UNDECIDED Disagree/Comply: Comply

## 2016-08-03 ENCOUNTER — Encounter: Payer: Self-pay | Admitting: Internal Medicine

## 2016-08-03 ENCOUNTER — Ambulatory Visit (INDEPENDENT_AMBULATORY_CARE_PROVIDER_SITE_OTHER): Payer: 59 | Admitting: Internal Medicine

## 2016-08-03 VITALS — BP 118/76 | HR 79 | Ht 69.0 in | Wt 222.0 lb

## 2016-08-03 DIAGNOSIS — K581 Irritable bowel syndrome with constipation: Secondary | ICD-10-CM | POA: Diagnosis not present

## 2016-08-03 DIAGNOSIS — Z3009 Encounter for other general counseling and advice on contraception: Secondary | ICD-10-CM | POA: Insufficient documentation

## 2016-08-03 DIAGNOSIS — Z30013 Encounter for initial prescription of injectable contraceptive: Secondary | ICD-10-CM

## 2016-08-03 DIAGNOSIS — A6000 Herpesviral infection of urogenital system, unspecified: Secondary | ICD-10-CM

## 2016-08-03 DIAGNOSIS — D500 Iron deficiency anemia secondary to blood loss (chronic): Secondary | ICD-10-CM | POA: Insufficient documentation

## 2016-08-03 DIAGNOSIS — K589 Irritable bowel syndrome without diarrhea: Secondary | ICD-10-CM | POA: Insufficient documentation

## 2016-08-03 DIAGNOSIS — D509 Iron deficiency anemia, unspecified: Secondary | ICD-10-CM

## 2016-08-03 LAB — POCT URINE PREGNANCY: Preg Test, Ur: NEGATIVE

## 2016-08-03 MED ORDER — MEDROXYPROGESTERONE ACETATE 150 MG/ML IM SUSP
150.0000 mg | Freq: Once | INTRAMUSCULAR | Status: AC
  Administered 2016-08-03: 150 mg via INTRAMUSCULAR

## 2016-08-03 NOTE — Assessment & Plan Note (Signed)
With negative urine pregnancy testing today, ok for depoprovera start, counseled to have every 3 mo, but if late will need neg pregnancy testing in the future as well, and once per yr regardless; this will not affect her long fertility ability, and should have no relation or affected by the genital herpes

## 2016-08-03 NOTE — Progress Notes (Signed)
Subjective:    Patient ID: Linda Fuller, female    DOB: 08-Dec-1990, 26 y.o.   MRN: [id number]  HPI  Here to f/u with request for restart depoprovera, plans to be married next month, had been on depomedrol for several years less that 10 yrs ago, then was 1 week late on f/u one time and now has 5yo child.  LMP x 2 days, she is ok for pregnancy testing today, and realizes the long term issue with wt gain and other potential side effect. Plans to cont every 3 mo.  She is wondering if this would affect fertility potential in a permanent way, and even how may be related somehow to the genital herpes.  Has had no know outbreaks but serology  From 2014.  Last Pap just over 1 yr, and plans to return soon, but had depo here before so that is why she is here. Wt Readings from Last 3 Encounters:  08/03/16 222 lb (100.7 kg)  05/07/16 215 lb (97.5 kg)  10/06/15 226 lb (102.5 kg)  Denies worsening reflux, dysphagia, n/v, bowel change or blood, but does have daily abd pain, bloating, cramping with IBS with chronic constipation, Linzess helps when nothing else has, though somehow feels worse for a time after each time she takes this.  But without it she would not have BM for 2-3 wks,  Has seem multiple specialists.   Past Medical History:  Diagnosis Date  . Anemia   . ANXIETY 01/27/2010  . CONSTIPATION 09/15/2009  . GERD (gastroesophageal reflux disease)   . IBS (irritable bowel syndrome)    No past surgical history on file.  reports that she has never smoked. She has never used smokeless tobacco. She reports that she drinks about 0.6 oz of alcohol per week . She reports that she does not use drugs. family history includes Diabetes in her mother; Heart disease in her mother; Stroke in her maternal grandfather and maternal grandmother. No Known Allergies Current Outpatient Prescriptions on File Prior to Visit  Medication Sig Dispense Refill  . Linaclotide (LINZESS) 145 MCG CAPS capsule Take 1 capsule (145 mcg  total) by mouth daily. 30 capsule 3   No current facility-administered medications on file prior to visit.    Review of Systems  Constitutional: Negative for other unusual diaphoresis or sweats HENT: Negative for ear discharge or swelling Eyes: Negative for other worsening visual disturbances Respiratory: Negative for stridor or other swelling  Gastrointestinal: Negative for worsening distension or other blood Genitourinary: Negative for retention or other urinary change Musculoskeletal: Negative for other MSK pain or swelling Skin: Negative for color change or other new lesions Neurological: Negative for worsening tremors and other numbness  Psychiatric/Behavioral: Negative for worsening agitation or other fatigue All other system neg  Per pt     Objective:   Physical Exam BP 118/76   Pulse 79   Ht 5\' 9"  (1.753 m)   Wt 222 lb (100.7 kg)   LMP 07/28/2016   SpO2 98%   BMI 32.78 kg/m  VS noted, obese Constitutional: Pt appears in NAD HENT: Head: NCAT.  Right Ear: External ear normal.  Left Ear: External ear normal.  Eyes: . Pupils are equal, round, and reactive to light. Conjunctivae and EOM are normal Nose: without d/c or deformity Neck: Neck supple. Gross normal ROM Cardiovascular: Normal rate and regular rhythm.   Pulmonary/Chest: Effort normal and breath sounds without rales or wheezing.  Abd:  Soft, NT, ND, + BS, no organomegaly GU:  deferred Neurological: Pt is alert. At baseline orientation, motor grossly intact Skin: Skin is warm. No rashes, other new lesions, no LE edema Psychiatric: Pt behavior is normal without agitation  No other exam findings  POCT - urine pregnancy - neg    Assessment & Plan:

## 2016-08-03 NOTE — Patient Instructions (Addendum)
Your pregnancy test was negative  You had the steroid shot today - the depoprovera  Please return every 3 months on time for the next shot (to make Nurse Visit_, and we will need to routinely check for pregnancy once per yr in the future  Please call if you have any outbreak and need medication for the viral infection.; this infection will not affect your ability to have children, though you may be counseled to have a C-section to avoid any exposure to the baby  Please see your GYN for your yearly pap smear  Please continue all other medications as before, and refills have been done if requested.  Please have the pharmacy call with any other refills you may need.  Please continue your efforts at being more active, low cholesterol diet, and weight control.  Please keep your appointments with your specialists as you may have planned  Please return in 1 year for your yearly visit, or sooner if needed

## 2016-08-03 NOTE — Assessment & Plan Note (Signed)
Chronic persistent stable, to cont linzess prn

## 2016-08-03 NOTE — Assessment & Plan Note (Signed)
No current rash or symptoms, ok to follow for now, may be counseled with future pregnancies to have c section to avoid infant exposure

## 2016-08-03 NOTE — Assessment & Plan Note (Signed)
Mild, declines lab testing at this time to f/u

## 2016-08-26 ENCOUNTER — Telehealth: Payer: Self-pay | Admitting: Internal Medicine

## 2016-08-26 DIAGNOSIS — N921 Excessive and frequent menstruation with irregular cycle: Secondary | ICD-10-CM

## 2016-08-26 NOTE — Telephone Encounter (Signed)
Please advise. Is this normal when starting the depo? Should she come in for a follow up?

## 2016-08-26 NOTE — Addendum Note (Signed)
Addended by: Corwin LevinsJOHN, Glanda Spanbauer W on: 08/26/2016 01:26 PM   Modules accepted: Orders

## 2016-08-26 NOTE — Telephone Encounter (Signed)
Patient states she is on her 4th week of bleeding after getting the depo injection.  States her first two weeks was light bleeding but the last two weeks have been heavy.  Patient states the last injection she got was the first time she has been on depo.  Patient would like a follow up call in regard.

## 2016-08-26 NOTE — Telephone Encounter (Signed)
Pt has been informed and expressed understanding.  

## 2016-08-26 NOTE — Telephone Encounter (Signed)
This is not usual for depomedrol and may indicate a different issue  I hope she is ok with referral to GYN as this is likely needed for further evaluation

## 2016-10-04 ENCOUNTER — Encounter: Payer: Self-pay | Admitting: Internal Medicine

## 2016-10-13 ENCOUNTER — Telehealth: Payer: Self-pay | Admitting: Internal Medicine

## 2016-10-13 MED ORDER — LINACLOTIDE 145 MCG PO CAPS: 145.0000 ug | ORAL_CAPSULE | Freq: Every day | ORAL | 3 refills | Status: DC

## 2016-10-13 NOTE — Telephone Encounter (Signed)
Linaclotide (LINZESS) 145 MCG CAPS capsule   [id number] [place], [place] - [address] [phone] (Phone) [phone] (Fax)   Patient is requesting a refill on this medication. She has not had a rx in a long time because she states Dr. Jonny RuizJohn had given her a bunch of samples. Please advise. Thank you.

## 2016-10-13 NOTE — Telephone Encounter (Signed)
Ok done erx 

## 2016-10-17 ENCOUNTER — Telehealth: Payer: Self-pay | Admitting: Internal Medicine

## 2016-10-17 NOTE — Telephone Encounter (Signed)
Pt said that she went to the pharmacy today to pick this up and it is over $300. She wanted to know if something else could be sent in or if she could have more samples?

## 2016-10-17 NOTE — Telephone Encounter (Signed)
unfortuanately we have no samples and I dont have a specific alternative.  The best I can say is to continue OTC that see to help as needed

## 2016-10-17 NOTE — Telephone Encounter (Signed)
Gave pt MD response. She explained understanding and does not have any questions at this time.

## 2016-10-18 NOTE — Telephone Encounter (Signed)
Error

## 2016-10-20 ENCOUNTER — Ambulatory Visit: Payer: 59

## 2016-10-21 ENCOUNTER — Telehealth: Payer: Self-pay | Admitting: Internal Medicine

## 2016-10-21 ENCOUNTER — Ambulatory Visit: Payer: Self-pay

## 2016-10-21 NOTE — Telephone Encounter (Signed)
FYI:  Patient came in today to get Depo injection.  Injection was scheduled for yesterday at 8am.  Patient arrived here at 730am today.  I informed patient that we could see her.  I placed her in a 9am slot and informed her that once clinical got in we could see about getting her back.  Patient did not have insurance card with her or insurance on file.  I informed patient that she would be self pay today.  Patient stated she did not want appointment for today.  I asked patient if she would like to schedule appointment for some time next week and she stated that would not work.  Patient stated she was just going to stop the depo injection.

## 2016-10-24 NOTE — Telephone Encounter (Signed)
This is noted  I dont understand how the patient could have an appt on Sunday at 8 am??

## 2017-02-11 ENCOUNTER — Encounter (HOSPITAL_COMMUNITY): Payer: Self-pay | Admitting: *Deleted

## 2017-02-11 ENCOUNTER — Inpatient Hospital Stay (HOSPITAL_COMMUNITY): Payer: Managed Care, Other (non HMO)

## 2017-02-11 ENCOUNTER — Inpatient Hospital Stay (HOSPITAL_COMMUNITY)
Admission: AD | Admit: 2017-02-11 | Discharge: 2017-02-11 | Disposition: A | Discharge status: Home / Self Care | Payer: Managed Care, Other (non HMO) | Source: Ambulatory Visit | Attending: Obstetrics and Gynecology | Admitting: Obstetrics and Gynecology

## 2017-02-11 ENCOUNTER — Other Ambulatory Visit: Payer: Self-pay

## 2017-02-11 DIAGNOSIS — Z3A01 Less than 8 weeks gestation of pregnancy: Secondary | ICD-10-CM | POA: Diagnosis not present

## 2017-02-11 DIAGNOSIS — F419 Anxiety disorder, unspecified: Secondary | ICD-10-CM | POA: Insufficient documentation

## 2017-02-11 DIAGNOSIS — K219 Gastro-esophageal reflux disease without esophagitis: Secondary | ICD-10-CM | POA: Insufficient documentation

## 2017-02-11 DIAGNOSIS — N939 Abnormal uterine and vaginal bleeding, unspecified: Secondary | ICD-10-CM | POA: Diagnosis present

## 2017-02-11 DIAGNOSIS — K589 Irritable bowel syndrome without diarrhea: Secondary | ICD-10-CM | POA: Insufficient documentation

## 2017-02-11 DIAGNOSIS — O209 Hemorrhage in early pregnancy, unspecified: Secondary | ICD-10-CM

## 2017-02-11 DIAGNOSIS — O99341 Other mental disorders complicating pregnancy, first trimester: Secondary | ICD-10-CM | POA: Insufficient documentation

## 2017-02-11 DIAGNOSIS — O039 Complete or unspecified spontaneous abortion without complication: Secondary | ICD-10-CM | POA: Diagnosis not present

## 2017-02-11 DIAGNOSIS — R109 Unspecified abdominal pain: Secondary | ICD-10-CM | POA: Insufficient documentation

## 2017-02-11 DIAGNOSIS — O26891 Other specified pregnancy related conditions, first trimester: Secondary | ICD-10-CM | POA: Insufficient documentation

## 2017-02-11 HISTORY — DX: Major depressive disorder, single episode, unspecified: F32.9

## 2017-02-11 HISTORY — DX: Depression, unspecified: F32.A

## 2017-02-11 LAB — CBC
HEMATOCRIT: 34.5 % — AB (ref 36.0–46.0)
HEMOGLOBIN: 11.2 g/dL — AB (ref 12.0–15.0)
MCH: 30.1 pg (ref 26.0–34.0)
MCHC: 32.5 g/dL (ref 30.0–36.0)
MCV: 92.7 fL (ref 78.0–100.0)
Platelets: 277 10*3/uL (ref 150–400)
RBC: 3.72 MIL/uL — AB (ref 3.87–5.11)
RDW: 13.2 % (ref 11.5–15.5)
WBC: 4.9 10*3/uL (ref 4.0–10.5)

## 2017-02-11 LAB — TYPE AND SCREEN
ABO/RH(D): A POS
ANTIBODY SCREEN: NEGATIVE

## 2017-02-11 LAB — ABO/RH: ABO/RH(D): A POS

## 2017-02-11 LAB — HCG, QUANTITATIVE, PREGNANCY: hCG, Beta Chain, Quant, S: 7956 m[IU]/mL — ABNORMAL HIGH (ref <5)

## 2017-02-11 MED ORDER — LACTATED RINGERS IV BOLUS (SEPSIS): 1000.0000 mL | Freq: Once | INTRAVENOUS | Status: DC

## 2017-02-11 MED ORDER — OXYCODONE-ACETAMINOPHEN 5-325 MG PO TABS: 1.0000 | ORAL_TABLET | Freq: Four times a day (QID) | ORAL | 0 refills | Status: DC | PRN

## 2017-02-11 MED ORDER — HYDROMORPHONE HCL 1 MG/ML IJ SOLN
0.5000 mg | Freq: Once | INTRAMUSCULAR | Status: AC
  Administered 2017-02-11: 0.5 mg via INTRAVENOUS
  Filled 2017-02-11: qty 1

## 2017-02-11 MED ORDER — MISOPROSTOL 200 MCG PO TABS
800.0000 ug | ORAL_TABLET | Freq: Once | ORAL | Status: AC
  Administered 2017-02-11: 800 ug via BUCCAL
  Filled 2017-02-11: qty 4

## 2017-02-11 NOTE — Discharge Instructions (Signed)
Return to care  °· If you have heavier bleeding that soaks through more that 2 pads per hour for an hour or more °· If you bleed so much that you feel like you might pass out or you do pass out °· If you have significant abdominal pain that is not improved with Tylenol  °· If you develop a fever > 100.5 ° ° ° ° °Miscarriage °A miscarriage is the sudden loss of an unborn baby (fetus) before the 20th week of pregnancy. Most miscarriages happen in the first 3 months of pregnancy. Sometimes, it happens before a woman even knows she is pregnant. A miscarriage is also called a "spontaneous miscarriage" or "early pregnancy loss." Having a miscarriage can be an emotional experience. Talk with your caregiver about any questions you may have about miscarrying, the grieving process, and your future pregnancy plans. °What are the causes? °· Problems with the fetal chromosomes that make it impossible for the baby to develop normally. Problems with the baby's genes or chromosomes are most often the result of errors that occur, by chance, as the embryo divides and grows. The problems are not inherited from the parents. °· Infection of the cervix or uterus. °· Hormone problems. °· Problems with the cervix, such as having an incompetent cervix. This is when the tissue in the cervix is not strong enough to hold the pregnancy. °· Problems with the uterus, such as an abnormally shaped uterus, uterine fibroids, or congenital abnormalities. °· Certain medical conditions. °· Smoking, drinking alcohol, or taking illegal drugs. °· Trauma. °Often, the cause of a miscarriage is unknown. °What are the signs or symptoms? °· Vaginal bleeding or spotting, with or without cramps or pain. °· Pain or cramping in the abdomen or lower back. °· Passing fluid, tissue, or blood clots from the vagina. °How is this diagnosed? °Your caregiver will perform a physical exam. You may also have an ultrasound to confirm the miscarriage. Blood or urine tests may  also be ordered. °How is this treated? °· Sometimes, treatment is not necessary if you naturally pass all the fetal tissue that was in the uterus. If some of the fetus or placenta remains in the body (incomplete miscarriage), tissue left behind may become infected and must be removed. Usually, a dilation and curettage (D and C) procedure is performed. During a D and C procedure, the cervix is widened (dilated) and any remaining fetal or placental tissue is gently removed from the uterus. °· Antibiotic medicines are prescribed if there is an infection. Other medicines may be given to reduce the size of the uterus (contract) if there is a lot of bleeding. °· If you have Rh negative blood and your baby was Rh positive, you will need a Rh immunoglobulin shot. This shot will protect any future baby from having Rh blood problems in future pregnancies. °Follow these instructions at home: °· Your caregiver may order bed rest or may allow you to continue light activity. Resume activity as directed by your caregiver. °· Have someone help with home and family responsibilities during this time. °· Keep track of the number of sanitary pads you use each day and how soaked (saturated) they are. Write down this information. °· Do not use tampons. Do not douche or have sexual intercourse until approved by your caregiver. °· Only take over-the-counter or prescription medicines for pain or discomfort as directed by your caregiver. °· Do not take aspirin. Aspirin can cause bleeding. °· Keep all follow-up appointments with your caregiver. °·   If you or your partner have problems with grieving, talk to your caregiver or seek counseling to help cope with the pregnancy loss. Allow enough time to grieve before trying to get pregnant again. °Get help right away if: °· You have severe cramps or pain in your back or abdomen. °· You have a fever. °· You pass large blood clots (walnut-sized or larger) or tissue from your vagina. Save any tissue  for your caregiver to inspect. °· Your bleeding increases. °· You have a thick, bad-smelling vaginal discharge. °· You become lightheaded, weak, or you faint. °· You have chills. °This information is not intended to replace advice given to you by your health care provider. Make sure you discuss any questions you have with your health care provider. °Document Released: 09/07/2000 Document Revised: 08/20/2015 Document Reviewed: 05/03/2011 °Elsevier Interactive Patient Education © 2017 [employer] ° °

## 2017-02-11 NOTE — MAU Note (Signed)
US at bedside to do ordered [place]

## 2017-02-11 NOTE — MAU Note (Signed)
Notified Judeth HornErin Lawrence NP pain is now 7/10 requested pain medication.

## 2017-02-11 NOTE — MAU Provider Note (Signed)
History     CSN: [id number]  Arrival date and time: 02/11/17 1429  Chief Complaint  Patient presents with  . Vaginal Bleeding  . Possible Pregnancy   HPI Linda Fuller [name] is a 26 y.o. [id number] female who presents with vaginal bleeding & abdominal pain. Reports taking plan B after intercourse 5 weeks ago. Started bleeding 13 days ago. Reports bleeding was initially like a period but became heavier 2 days ago. Today reports heavy bleeding & passing large clots. Reports lower abdominal pain that is shar p & cramp like. Rates pain 7/10. Has not treated.  LMP 12/27/16. Has not taken a pregnancy test at home.   OB History    Gravida Para Term Preterm AB Living   3 1 1  0 1 1   SAB TAB Ectopic Multiple Live Births   0 1 0 0 1      Past Medical History:  Diagnosis Date  . Anemia   . ANXIETY 01/27/2010  . CONSTIPATION 09/15/2009  . GERD (gastroesophageal reflux disease)   . IBS (irritable bowel syndrome)     No past surgical history on file.  Family History  Problem Relation Age of Onset  . Heart disease Mother        Pacemaker   . Diabetes Mother   . Stroke Maternal Grandmother   . Stroke Maternal Grandfather   . Colon cancer Neg Hx     Social History   Tobacco Use  . Smoking status: Never Smoker  . Smokeless tobacco: Never Used  Substance Use Topics  . Alcohol use: Yes    Alcohol/week: 0.6 oz    Types: 1 Glasses of wine per week  . Drug use: No    Allergies: No Known Allergies  Medications Prior to Admission  Medication Sig Dispense Refill Last Dose  . linaclotide (LINZESS) 145 MCG CAPS capsule Take 1 capsule (145 mcg total) by mouth daily. 30 capsule 3     Review of Systems  Constitutional: Negative.   Gastrointestinal: Positive for abdominal pain.  Genitourinary: Positive for vaginal bleeding.   Physical Exam   Blood pressure 131/75, pulse 79, temperature 98 F (36.7 C), resp. rate 16, last menstrual period 12/27/2016.  Orthostatic VS for the past 24  hrs:  BP- Lying Pulse- Lying BP- Sitting Pulse- Sitting BP- Standing at 0 minutes Pulse- Standing at 0 minutes  02/11/17 1548 134/77 78 124/75 89 122/78 99      Physical Exam  Nursing note and vitals reviewed. Constitutional: She is oriented to person, place, and time. She appears well-developed and well-nourished. No distress.  HENT:  Head: Normocephalic and atraumatic.  Eyes: Conjunctivae are normal. Right eye exhibits no discharge. Left eye exhibits no discharge. No scleral icterus.  Neck: Normal range of motion.  Cardiovascular: Normal rate, regular rhythm and normal heart sounds.  No murmur heard. Respiratory: Effort normal and breath sounds normal. No respiratory distress. She has no wheezes.  GI: Soft. There is no tenderness.  Genitourinary: Uterus is enlarged. Uterus is not tender. Cervix exhibits no motion tenderness and no friability. Right adnexum displays no mass and no tenderness. Left adnexum displays no mass and no tenderness. There is bleeding (moderate to large amount of blood. Fist sized clots x2. Small amount of tissue removed from os. ) in the vagina.  Neurological: She is alert and oriented to person, place, and time.  Skin: Skin is warm and dry. She is not diaphoretic.  Psychiatric: She has a normal mood and affect. Her behavior  is normal. Judgment and thought content normal.    MAU Course  Procedures Results for orders placed or performed during the hospital encounter of 02/11/17 (from the past 24 hour(s))  CBC     Status: Abnormal   Collection Time: 02/11/17  2:37 PM  Result Value Ref Range   WBC 4.9 4.0 - 10.5 K/uL   RBC 3.72 (L) 3.87 - 5.11 MIL/uL   Hemoglobin 11.2 (L) 12.0 - 15.0 g/dL   HCT 16.1 (L) 09.6 - 04.5 %   MCV 92.7 78.0 - 100.0 fL   MCH 30.1 26.0 - 34.0 pg   MCHC 32.5 30.0 - 36.0 g/dL   RDW 40.9 81.1 - 91.4 %   Platelets 277 150 - 400 K/uL  hCG, quantitative, pregnancy     Status: Abnormal   Collection Time: 02/11/17  2:37 PM  Result Value  Ref Range   hCG, Beta Chain, Quant, S 7,956 (H) <5 mIU/mL  Type and screen     Status: None   Collection Time: 02/11/17  2:37 PM  Result Value Ref Range   ABO/RH(D) A POS    Antibody Screen NEG    Sample Expiration 02/14/2017   ABO/Rh     Status: None   Collection Time: 02/11/17  2:37 PM  Result Value Ref Range   ABO/RH(D) A POS   US Ob Comp Less 14 Wks  Result Date: 02/11/2017 CLINICAL DATA:  Bleeding for 13 days status post plan B on October 9th EXAM: OBSTETRIC <14 WK [place] AND TRANSVAGINAL OB US TECHNIQUE: Both transabdominal and transvaginal ultrasound examinations were performed for complete evaluation of the gestation as well as the maternal uterus, adnexal regions, and pelvic cul-de-sac. Transvaginal technique was performed to assess early pregnancy. COMPARISON:  None. FINDINGS: Intrauterine gestational sac: No normal-appearing gestational sac identified. Yolk sac:  Not seen Embryo:  Not seen Cardiac Activity: Not seen Maternal uterus/adnexae: Endometrial complex appears thickened and heterogeneous, measuring up to 2.1 cm. Focus of active hemorrhage demonstrated during the exam within the fundal region of the endometrial complex. Sonographer states that the associated fluid collection within the fundal region of the endometrial complex intermittently resolved and reaccumulated during the exam. Endocervical canal is distended with heterogeneous material, also presumed blood products. Both ovaries appear normal and there is no mass or free fluid identified within either adnexal region. No free fluid in the cul-de-sac IMPRESSION: 1. Endometrial complex is thickened and heterogeneous, measuring up to 2.1 cm thickness. Focus of active hemorrhage is demonstrated on today's exam within the fundal region of the endometrial complex, with associated fluid collection which resolved and re-accumulated intermittently during the exam. Retained products of conception cannot be excluded. 2. No intrauterine  pregnancy demonstrated. 3. Both ovaries appear normal and there is no mass or free fluid seen within either adnexal region. Electronically Signed   By: Bary Richard M.D.   On: 02/11/2017 15:29   US Ob Transvaginal  Result Date: 02/11/2017 CLINICAL DATA:  Bleeding for 13 days status post plan B on October 9th EXAM: OBSTETRIC <14 WK [place] AND TRANSVAGINAL OB US TECHNIQUE: Both transabdominal and transvaginal ultrasound examinations were performed for complete evaluation of the gestation as well as the maternal uterus, adnexal regions, and pelvic cul-de-sac. Transvaginal technique was performed to assess early pregnancy. COMPARISON:  None. FINDINGS: Intrauterine gestational sac: No normal-appearing gestational sac identified. Yolk sac:  Not seen Embryo:  Not seen Cardiac Activity: Not seen Maternal uterus/adnexae: Endometrial complex appears thickened and heterogeneous, measuring up to 2.1 cm.  Focus of active hemorrhage demonstrated during the exam within the fundal region of the endometrial complex. Sonographer states that the associated fluid collection within the fundal region of the endometrial complex intermittently resolved and reaccumulated during the exam. Endocervical canal is distended with heterogeneous material, also presumed blood products. Both ovaries appear normal and there is no mass or free fluid identified within either adnexal region. No free fluid in the cul-de-sac IMPRESSION: 1. Endometrial complex is thickened and heterogeneous, measuring up to 2.1 cm thickness. Focus of active hemorrhage is demonstrated on today's exam within the fundal region of the endometrial complex, with associated fluid collection which resolved and re-accumulated intermittently during the exam. Retained products of conception cannot be excluded. 2. No intrauterine pregnancy demonstrated. 3. Both ovaries appear normal and there is no mass or free fluid seen within either adnexal region. Electronically Signed   By: Bary RichardStan   Maynard M.D.   On: 02/11/2017 15:29     MDM On arrival patient with moderate-large amount of bleeding & passing fist sized clots. IV started, labs drawn, & bedside ultrasound ordered.  Tissue removed from os during exam, sent to pathology HCG 7956. Ultrasound shows no IUP, thickened endometrium & hemorrhage. Normal adnexa.  A positive Hemoglobin stable at 11.3 VS stable & pt not orthostatic Cytotec 800 mcg buccal given Dilaudid 0.5 mg IV for pain Bleeding has improved. Pt up to bathroom with minimal bleeding.  C/w Dr. Vergie LivingPickens. Ok to discharge home. Patient to have f/u ultrasound in 1 week prior to miscarriage f/u appointment.  Assessment and Plan  A: 1. Miscarriage   2. Vaginal bleeding in pregnancy, first trimester   3. Abdominal pain during pregnancy in first trimester   4. Less than [redacted] weeks gestation of pregnancy    P: Discharge home Rx percocet #10 Outpatient ultrasound ordered msg to [facility] for f/u Pathology report pending Discussed reasons to return to MAU  Judeth HornErin Larisa Lanius 02/11/2017, 2:37 PM

## 2017-02-11 NOTE — MAU Note (Signed)
[facility]reporting bleeding and cramping.  Did not know if she was preg, thought she might be having a miscarriage.  Had taken Plan B about 5 wks. Ago.  Started bleeding 2 wks later.  Has been bleeding for 13 days.  Last 2 days, had gotten heavier and passing clots.  Came in feeling pressure thought something was coming out.

## 2017-02-15 ENCOUNTER — Other Ambulatory Visit (INDEPENDENT_AMBULATORY_CARE_PROVIDER_SITE_OTHER): Payer: Self-pay | Admitting: General Practice

## 2017-02-15 ENCOUNTER — Telehealth: Payer: Self-pay | Admitting: General Practice

## 2017-02-15 ENCOUNTER — Telehealth: Payer: Self-pay | Admitting: Obstetrics and Gynecology

## 2017-02-15 DIAGNOSIS — O209 Hemorrhage in early pregnancy, unspecified: Secondary | ICD-10-CM

## 2017-02-15 DIAGNOSIS — Z3490 Encounter for supervision of normal pregnancy, unspecified, unspecified trimester: Secondary | ICD-10-CM

## 2017-02-15 DIAGNOSIS — O3680X0 Pregnancy with inconclusive fetal viability, not applicable or unspecified: Secondary | ICD-10-CM

## 2017-02-15 LAB — HCG, QUANTITATIVE, PREGNANCY: HCG, BETA CHAIN, QUANT, S: 340 m[IU]/mL — AB (ref <5)

## 2017-02-15 NOTE — Progress Notes (Signed)
Patient here for stat bhcg today per Dr Vergie LivingPickens. Patient states the bleeding has slowed down some but she is still passing clots. Patient had a lot of questions about why this lab work is being done and what is going on. Explained to patient that the pathology that was sent from her MAU visit didn't show productions of conception/pregnancy. Dr Vergie LivingPickens is wanting to check the pregnancy hormone level to ensure it is decreasing. Discussed with patient that since a pregnancy wasn't visualized somewhere the concern is always ectopic pregnancy especially given the pathology results. Discussed with patient having her wait in lobby for results & updated plan of care in which Dr Vergie LivingPickens would come down to talk to her about all of this. Patient verbalized understanding & had no other questions at this time.   Dr Vergie LivingPickens called and notified of results & will come see patient in office.

## 2017-02-15 NOTE — Telephone Encounter (Signed)
GYN Note  Pathology didn't show villi and u/s on 11/17 didn't show an IUP. Pt called and still having some bleeding but better than before. D/w her that since no definitive evidence for IUP that recommend getting a stat beta hcg to see which way the value is trending. Patient unable to come until after work and clinic will be closed by then so patient advised to come to MAU and will wait for beta hcg draw and result. Patient aware  Will need beta-hcg and can get CBC as well, for reassurance. If # is dropping significantly, recommend qwk betas until negative If # isn't dropping significantly and/or is increased, recommend pelvic u/s  Linda Fuller, Jr MD Attending Center for [facility]ulty [facility]) 02/15/2017 Time: 1243pm

## 2017-02-15 NOTE — Telephone Encounter (Signed)
Opened in error

## 2017-02-15 NOTE — Progress Notes (Signed)
  Occasionally passing small clots but bleeding is decreasing and no s/s of infection. Beta has dropped substantially so most likely an IUP and SAB but recommend following betas down to zero which she is amenable to . Offered methergine but pt declines as bleeding not bad and is decreasing. Recommend qday iron x 1 month with vitamin c. Pt likely will want bc and can d/w her more after repeat beta. Will have her come back in for non stat repeat beta in 1.5-2wks.   Results for Linda Fuller, Linda Fuller (MRN [id number]) as of 02/15/2017 16:02  Ref. Range 02/11/2017 14:37 02/15/2017 14:30  HCG, Beta Chain, Quant, S Latest Ref Range: <5 mIU/mL 7,956 (H) 340 (H)    Cornelia Copaharlie Klare Criss, Jr MD Attending Center for [facility] 02/15/2017 Time: 1600

## 2017-02-16 LAB — CBC
HEMATOCRIT: 32.6 % — AB (ref 34.0–46.6)
HEMOGLOBIN: 10.3 g/dL — AB (ref 11.1–15.9)
MCH: 29.9 pg (ref 26.6–33.0)
MCHC: 31.6 g/dL (ref 31.5–35.7)
MCV: 95 fL (ref 79–97)
Platelets: 324 10*3/uL (ref 150–379)
RBC: 3.45 x10E6/uL — AB (ref 3.77–5.28)
RDW: 13.6 % (ref 12.3–15.4)
WBC: 4.6 10*3/uL (ref 3.4–10.8)

## 2017-02-20 ENCOUNTER — Ambulatory Visit (HOSPITAL_COMMUNITY): Admission: RE | Admit: 2017-02-20 | Payer: Self-pay | Source: Ambulatory Visit

## 2017-02-28 ENCOUNTER — Other Ambulatory Visit (INDEPENDENT_AMBULATORY_CARE_PROVIDER_SITE_OTHER): Payer: Self-pay

## 2017-02-28 DIAGNOSIS — Z113 Encounter for screening for infections with a predominantly sexual mode of transmission: Secondary | ICD-10-CM

## 2017-02-28 DIAGNOSIS — N898 Other specified noninflammatory disorders of vagina: Secondary | ICD-10-CM

## 2017-02-28 DIAGNOSIS — O039 Complete or unspecified spontaneous abortion without complication: Secondary | ICD-10-CM

## 2017-02-28 NOTE — Progress Notes (Signed)
Here today for non-stat bhcg. At this point states having spotting, little cramping and c/o vaginal itching and odor. Request to do test- will do wet prep (self-swab).

## 2017-03-01 LAB — CERVICOVAGINAL ANCILLARY ONLY
Bacterial vaginitis: POSITIVE — AB
CHLAMYDIA, DNA PROBE: NEGATIVE
Candida vaginitis: NEGATIVE
NEISSERIA GONORRHEA: NEGATIVE
Trichomonas: NEGATIVE

## 2017-03-01 LAB — BETA HCG QUANT (REF LAB): HCG QUANT: 5 m[IU]/mL

## 2017-03-02 ENCOUNTER — Telehealth: Payer: Self-pay | Admitting: General Practice

## 2017-03-02 NOTE — Telephone Encounter (Signed)
Patient called and left message on nurse line requesting bhcg results. Called patient, no answer- left message stating we are trying to reach you to return your phone call, please check your mychart for results.

## 2017-03-03 ENCOUNTER — Other Ambulatory Visit: Payer: Self-pay | Admitting: General Practice

## 2017-03-03 DIAGNOSIS — N760 Acute vaginitis: Principal | ICD-10-CM

## 2017-03-03 DIAGNOSIS — B9689 Other specified bacterial agents as the cause of diseases classified elsewhere: Secondary | ICD-10-CM

## 2017-03-03 MED ORDER — METRONIDAZOLE 500 MG PO TABS: 500.0000 mg | ORAL_TABLET | Freq: Two times a day (BID) | ORAL | 0 refills | Status: DC

## 2017-03-08 ENCOUNTER — Ambulatory Visit: Payer: Self-pay

## 2017-03-08 ENCOUNTER — Telehealth: Payer: Self-pay | Admitting: Internal Medicine

## 2017-03-08 DIAGNOSIS — K6289 Other specified diseases of anus and rectum: Secondary | ICD-10-CM

## 2017-03-08 NOTE — Addendum Note (Signed)
Addended by: Corwin LevinsJOHN, Dammon Makarewicz W on: 03/08/2017 05:30 PM   Modules accepted: Orders

## 2017-03-08 NOTE — Telephone Encounter (Signed)
Gi referral done

## 2017-03-08 NOTE — Telephone Encounter (Signed)
Copied from CRM [phone]. Topic: Referral - Request >> Mar 08, 2017  2:05 PM Everardo PacificMoton, Gladyes Kudo, [place] wrote: CRM for notification. See Telephone encounter for: Patient called because she would like a referral to see a gastro doc because she is having sharp anal pain and cramping in the stomach and also she is having trouble with sitting down.Patient also stated that she is having problems when she goes to the bathroom. If someone could please give her a call back about this at [phone]  03/08/17.

## 2017-03-08 NOTE — Telephone Encounter (Signed)
  Reason for Disposition . [1] Pain with sexual intercourse (dyspareunia) AND [2] new onset (in past 4 weeks)  Answer Assessment - Initial Assessment Questions 1. LOCATION: "Where does it hurt?"      My rectum 2. RADIATION: "Does the pain shoot anywhere else?" (e.g., lower back, groin, thighs)     Not really 3. ONSET: "When did the pain begin?" (e.g., minutes, hours or days ago)      Started this morning, but this has been going on for weeks 4. SUDDEN: "Gradual or sudden onset?"     Gradual 5. PATTERN "Does the pain come and go, or is it constant?"    - If constant: "Is it getting better, staying the same, or worsening?"      (Note: Constant means the pain never goes away completely; most serious pain is constant and gets worse over time)     - If intermittent: "How long does it last?" "Do you have pain now?"     (Note: Intermittent means the pain goes away completely between bouts)     Comes and goes, but today is constant 6. SEVERITY: "How bad is the pain?"  (e.g., Scale 1-10; mild, moderate, or severe)   - MILD (1-3): doesn't interfere with normal activities, area soft and not tender to touch    - MODERATE (4-7): interferes with normal activities or awakens from sleep, tender to touch    - SEVERE (8-10): excruciating pain, doubled over, unable to do any normal activities      10 7. RECURRENT SYMPTOM: "Have you ever had this type of pelvic pain before?" If so, ask: "When was the last time?" and "What happened that time?"      Yes - been going on for years 8. CAUSE: "What do you think is causing the pelvic pain?"     Not sure - Has IBS. 9. RELIEVING/AGGRAVATING FACTORS: "What makes it better or worse?" (e.g., activity/rest, sexual intercourse, voiding, passing stool)     pASSING STOOL HELPS 10. OTHER SYMPTOMS: "Has there been any other symptoms?" (e.g., fever, vaginal bleeding, vaginal discharge, diarrhea, constipation, or voiding problems?"       Vaginal discomfort 11. PREGNANCY: "Is  there any chance you are pregnant?" "When was your last menstrual period?"       No - miscarried several weeks.  Protocols used: PELVIC PAIN - FEMALE-A-AH Vomits once a month and sometimes she feels like it's bowel material. States she has chronic constipation and IBS. Today after her bowel movement she has had a sharp pain in her rectum. Requests a referral back to gastroenterologist. Does not want appointment with Dr. Jonny RuizJohn because she feels he has done everything he can for her.

## 2017-03-09 NOTE — Telephone Encounter (Signed)
Called pt no answer LMOM MD has place the referral will be contacted appt has been set-up...Raechel Chute/lmb

## 2017-04-21 ENCOUNTER — Ambulatory Visit: Payer: Self-pay

## 2017-04-21 NOTE — Telephone Encounter (Signed)
  Reason for Disposition . Earache  (Exceptions: brief ear pain of < 60 minutes duration, earache occurring during air travel  Answer Assessment - Initial Assessment Questions 1. LOCATION: "Which ear is involved?"     Both ears 2. ONSET: "When did the ear start hurting"      Started Wednesday 3. SEVERITY: "How bad is the pain?"  (Scale 1-10; mild, moderate or severe)   - MILD (1-3): doesn't interfere with normal activities    - MODERATE (4-7): interferes with normal activities or awakens from sleep    - SEVERE (8-10): excruciating pain, unable to do any normal activities      7 4. URI SYMPTOMS: " Do you have a runny nose or cough?"     Sinus pressure 5. FEVER: "Do you have a fever?" If so, ask: "What is your temperature, how was it measured, and when did it start?"     No 6. CAUSE: "Have you been swimming recently?", "How often do you use Q-TIPS?", "Have you had any recent air travel or scuba diving?"     No 7. OTHER SYMPTOMS: "Do you have any other symptoms?" (e.g., headache, stiff neck, dizziness, vomiting, runny nose, decreased hearing)     Runny nose, vertigo 8. PREGNANCY: "Is there any chance you are pregnant?" "When was your last menstrual period?"     No  Protocols used: EARACHE-A-AH Pt. Refused two appointment times because of her schedule. States she will go to urgent care.

## 2017-07-27 ENCOUNTER — Encounter (HOSPITAL_COMMUNITY): Payer: Self-pay | Admitting: Family Medicine

## 2017-07-27 ENCOUNTER — Ambulatory Visit (HOSPITAL_COMMUNITY)
Admission: EM | Admit: 2017-07-27 | Discharge: 2017-07-27 | Disposition: A | Discharge status: Home / Self Care | Payer: Managed Care, Other (non HMO) | Attending: Family Medicine | Admitting: Family Medicine

## 2017-07-27 DIAGNOSIS — H6503 Acute serous otitis media, bilateral: Secondary | ICD-10-CM | POA: Diagnosis not present

## 2017-07-27 MED ORDER — AMOXICILLIN 875 MG PO TABS: 875.0000 mg | ORAL_TABLET | Freq: Two times a day (BID) | ORAL | 1 refills | Status: DC

## 2017-07-27 NOTE — ED Triage Notes (Signed)
Pt here for bilateral ear pain and congestion. Reports some dizziness with it.

## 2017-07-27 NOTE — ED Provider Notes (Signed)
[facility]    CSN: [id number] Arrival date & time: 07/27/17  1849     History   Chief Complaint Chief Complaint  Patient presents with  . Otalgia    HPI Linda Fuller is a 27 y.o. female.   Patient says her ears are stopped up she has some sinus pressure and dizziness.  HPI  Past Medical History:  Diagnosis Date  . Anemia   . ANXIETY 01/27/2010  . CONSTIPATION 09/15/2009  . Depression   . GERD (gastroesophageal reflux disease)   . IBS (irritable bowel syndrome)     Patient Active Problem List   Diagnosis Date Noted  . IBS (irritable bowel syndrome) 08/03/2016  . Counseling for initiation of birth control method 08/03/2016  . Anemia, iron deficiency 08/03/2016  . Abdominal pain, RUQ 10/07/2015  . Acute pharyngitis 04/29/2015  . Eczema 04/29/2015  . Painful breasts 01/29/2015  . Fatigue 09/10/2014  . Migraine 09/10/2014  . GERD (gastroesophageal reflux disease) 09/10/2014  . Obesity 09/10/2014  . STD exposure 09/10/2014  . Acute cystitis 11/12/2013  . Delayed menses 09/04/2013  . Chronic constipation 01/22/2013  . Hematochezia 01/22/2013  . Rash and nonspecific skin eruption 01/22/2013  . Genital herpes 06/12/2012  . Postpartum care following vaginal delivery (7/20) 10/15/2011  . Preventative health care 08/16/2010  . Anxiety and depression 01/27/2010  . BACK PAIN 10/03/2008    Past Surgical History:  Procedure Laterality Date  . NO PAST SURGERIES    . THERAPEUTIC ABORTION     elective    OB History    Gravida  3   Para  1   Term  1   Preterm  0   AB  1   Living  1     SAB  0   TAB  1   Ectopic  0   Multiple  0   Live Births  1            Home Medications    Prior to Admission medications   Medication Sig Start Date End Date Taking? Authorizing Provider  linaclotide (LINZESS) 145 MCG CAPS capsule Take 1 capsule (145 mcg total) by mouth daily. 10/13/16   Corwin Levins, MD  metroNIDAZOLE (FLAGYL) 500 MG tablet  Take 1 tablet (500 mg total) by mouth 2 (two) times daily. 03/03/17   [place] Bing, MD    Family History Family History  Problem Relation Age of Onset  . Heart disease Mother        Pacemaker   . Diabetes Mother   . Hypertension Mother   . Stroke Maternal Grandmother   . Diabetes Maternal Grandmother   . Hypertension Maternal Grandmother   . Stroke Maternal Grandfather   . Heart disease Maternal Grandfather   . Hypertension Father   . Colon cancer Neg Hx     Social History Social History   Tobacco Use  . Smoking status: Never Smoker  . Smokeless tobacco: Never Used  Substance Use Topics  . Alcohol use: Yes    Comment: last was a month ago  . Drug use: No     Allergies   Patient has no known allergies.   Review of Systems Review of Systems  Constitutional: Negative.   HENT: Positive for congestion, ear pain, sinus pressure, sinus pain and sore throat.   Respiratory: Negative.   Cardiovascular: Negative.   Neurological: Positive for dizziness.     Physical Exam Triage Vital Signs ED Triage Vitals  Enc Vitals Group  BP 07/27/17 1918 128/89     Pulse Rate 07/27/17 1918 86     Resp 07/27/17 1918 18     Temp 07/27/17 1918 99.4 F (37.4 C)     Temp src --      SpO2 07/27/17 1918 100 %     Weight --      Height --      Head Circumference --      Peak Flow --      Pain Score 07/27/17 1915 4     Pain Loc --      Pain Edu? --      Excl. in GC? --    No data found.  Updated Vital Signs BP 128/89   Pulse 86   Temp 99.4 F (37.4 C)   Resp 18   LMP 12/27/2016   SpO2 100%   Breastfeeding? Unknown   Visual Acuity Right Eye Distance:   Left Eye Distance:   Bilateral Distance:    Right Eye Near:   Left Eye Near:    Bilateral Near:     Physical Exam  Constitutional: She appears well-developed and well-nourished.  HENT:  Head: Normocephalic.  Mouth/Throat: Oropharynx is clear and moist.  TMs are dull with increased vascularity and did not  move with Valsalva maneuver there is tenderness in the frontal sinus area  Neck: Normal range of motion.  Cardiovascular: Normal rate.  Pulmonary/Chest: Effort normal.     UC Treatments / Results  Labs (all labs ordered are listed, but only abnormal results are displayed) Labs Reviewed - No data to display  EKG None  Radiology No results found.  Procedures Procedures (including critical care time)  Medications Ordered in UC Medications - No data to display  Initial Impression / Assessment and Plan / UC Course  I have reviewed the triage vital signs and the nursing notes.  Pertinent labs & imaging results that were available during my care of the patient were reviewed by me and considered in my medical decision making (see chart for details).     Serous effusion and possible sinusitis.  We discussed use of 3-day course of Afrin, Flonase, and ear exercises. Final Clinical Impressions(s) / UC Diagnoses   Final diagnoses:  None   Discharge Instructions   None    ED Prescriptions    None     Controlled Substance Prescriptions [place] Controlled Substance Registry consulted? No   Frederica Kuster, MD 07/27/17 1932

## 2017-08-23 ENCOUNTER — Other Ambulatory Visit: Payer: Self-pay | Admitting: General Practice

## 2017-08-23 DIAGNOSIS — B9689 Other specified bacterial agents as the cause of diseases classified elsewhere: Secondary | ICD-10-CM

## 2017-08-23 DIAGNOSIS — N760 Acute vaginitis: Principal | ICD-10-CM

## 2017-08-23 MED ORDER — METRONIDAZOLE 500 MG PO TABS: 500.0000 mg | ORAL_TABLET | Freq: Two times a day (BID) | ORAL | 0 refills | Status: DC

## 2017-08-26 ENCOUNTER — Other Ambulatory Visit: Payer: Self-pay

## 2017-08-26 ENCOUNTER — Ambulatory Visit (HOSPITAL_COMMUNITY)
Admission: EM | Admit: 2017-08-26 | Discharge: 2017-08-26 | Disposition: A | Discharge status: Home / Self Care | Payer: Managed Care, Other (non HMO) | Attending: Internal Medicine | Admitting: Internal Medicine

## 2017-08-26 ENCOUNTER — Encounter (HOSPITAL_COMMUNITY): Payer: Self-pay | Admitting: Emergency Medicine

## 2017-08-26 DIAGNOSIS — N898 Other specified noninflammatory disorders of vagina: Secondary | ICD-10-CM | POA: Insufficient documentation

## 2017-08-26 LAB — POCT URINALYSIS DIP (DEVICE)
Bilirubin Urine: NEGATIVE
Glucose, UA: NEGATIVE mg/dL
Hgb urine dipstick: NEGATIVE
Ketones, ur: NEGATIVE mg/dL
Nitrite: NEGATIVE
PROTEIN: NEGATIVE mg/dL
SPECIFIC GRAVITY, URINE: 1.02 (ref 1.005–1.030)
UROBILINOGEN UA: 0.2 mg/dL (ref 0.0–1.0)
pH: 8.5 — ABNORMAL HIGH (ref 5.0–8.0)

## 2017-08-26 LAB — POCT PREGNANCY, URINE: Preg Test, Ur: NEGATIVE

## 2017-08-26 MED ORDER — FLUCONAZOLE 150 MG PO TABS: 150.0000 mg | ORAL_TABLET | Freq: Once | ORAL | 0 refills | Status: AC

## 2017-08-26 NOTE — ED Notes (Signed)
Vaginal swab placed in lab 

## 2017-08-26 NOTE — ED Triage Notes (Signed)
The patient presented to the [facility] with a complaint of vaginal discharge x 2 days.

## 2017-08-26 NOTE — Discharge Instructions (Signed)
Please continue to finish the full course of metronidazole to treat BV.  Please take 1 tablet of Diflucan today, may take another tablet 1 finish with the course of metronidazole.  Do not drink alcohol while taking metronidazole.  We are testing you for Gonorrhea, Chlamydia, Trichomonas, Yeast and Bacterial Vaginosis. We will call you if anything is positive and let you know if you require any further treatment. Please inform partners of any positive results.   Please return if symptoms not improving with treatment, development of fever, nausea, vomiting, abdominal pain.

## 2017-08-27 NOTE — ED Provider Notes (Signed)
[facility]    CSN: [id number] Arrival date & time: 08/26/17  1540     History   Chief Complaint Chief Complaint  Patient presents with  . Vaginal Discharge    HPI [name] is a 27 y.o. female presenting today for evaluation of vaginal discharge.  Symptoms began approximately 4 to 5 days ago.  She has a history of BV, [facility]ron[facility] for her.  She has been taking this for approximately 4 days.  Since she has felt like she has developed worsening discharge, itching and burning.  Notes the discharge to be cottage cheeselike.  Has some mild dysuria.  Denies any increased frequency.  Last visual period was last Tuesday.  Is not on any form of birth control.  Patient declines being concern for STDs.  HPI  Past Medical History:  Diagnosis Date  . Anemia   . ANXIETY 01/27/2010  . CONSTIPATION 09/15/2009  . Depression   . GERD (gastroesophageal reflux disease)   . IBS (irritable bowel syndrome)     Patient Active Problem List   Diagnosis Date Noted  . IBS (irritable bowel syndrome) 08/03/2016  . Counseling for initiation of birth control method 08/03/2016  . Anemia, iron deficiency 08/03/2016  . Abdominal pain, RUQ 10/07/2015  . Acute pharyngitis 04/29/2015  . Eczema 04/29/2015  . Painful breasts 01/29/2015  . Fatigue 09/10/2014  . Migraine 09/10/2014  . GERD (gastroesophageal reflux disease) 09/10/2014  . Obesity 09/10/2014  . STD exposure 09/10/2014  . Acute cystitis 11/12/2013  . Delayed menses 09/04/2013  . Chronic constipation 01/22/2013  . Hematochezia 01/22/2013  . Rash and nonspecific skin eruption 01/22/2013  . Genital herpes 06/12/2012  . Postpartum care following vaginal delivery (7/20) 10/15/2011  . Preventative health care 08/16/2010  . Anxiety and depression 01/27/2010  . BACK PAIN 10/03/2008    Past Surgical History:  Procedure Laterality Date  . NO PAST SURGERIES    . THERAPEUTIC ABORTION     elective     OB History    Gravida  3   Para  1   Term  1   Preterm  0   AB  1   Living  1     SAB  0   TAB  1   Ectopic  0   Multiple  0   Live Births  1            Home Medications    Prior to Admission medications   Not on File    Family History Family History  Problem Relation Age of Onset  . Heart disease Mother        Pacemaker   . Diabetes Mother   . Hypertension Mother   . Stroke Maternal Grandmother   . Diabetes Maternal Grandmother   . Hypertension Maternal Grandmother   . Stroke Maternal Grandfather   . Heart disease Maternal Grandfather   . Hypertension Father   . Colon cancer Neg Hx     Social History Social History   Tobacco Use  . Smoking status: Never Smoker  . Smokeless tobacco: Never Used  Substance Use Topics  . Alcohol use: Yes    Comment: last was a month ago  . Drug use: No     Allergies   Patient has no known allergies.   Review of Systems Review of Systems  Constitutional: Negative for fever.  Respiratory: Negative for shortness of breath.   Cardiovascular: Negative for chest pain.  Gastrointestinal: Negative for abdominal pain, diarrhea, nausea and vomiting.  Genitourinary: Positive for dysuria and vaginal discharge. Negative for flank pain, genital sores, hematuria, menstrual problem, vaginal bleeding and vaginal pain.  Musculoskeletal: Negative for back pain.  Skin: Negative for rash.  Neurological: Negative for dizziness, light-headedness and headaches.     Physical Exam Triage Vital Signs ED Triage Vitals  Enc Vitals Group     BP 08/26/17 1602 129/84     Pulse Rate 08/26/17 1602 80     Resp 08/26/17 1602 18     Temp 08/26/17 1602 98.8 F (37.1 C)     Temp Source 08/26/17 1602 Oral     SpO2 08/26/17 1602 100 %     Weight --      Height --      Head Circumference --      Peak Flow --      Pain Score 08/26/17 1600 0     Pain Loc --      Pain Edu? --      Excl. in GC? --    No data  found.  Updated Vital Signs BP 129/84 (BP Location: Left Arm)   Pulse 80   Temp 98.8 F (37.1 C) (Oral)   Resp 18   LMP 08/22/2017 (Exact Date)   SpO2 100%   Visual Acuity Right Eye Distance:   Left Eye Distance:   Bilateral Distance:    Right Eye Near:   Left Eye Near:    Bilateral Near:     Physical Exam  Constitutional: She appears well-developed and well-nourished. No distress.  HENT:  Head: Normocephalic and atraumatic.  Eyes: Conjunctivae are normal.  Neck: Neck supple.  Cardiovascular: Normal rate and regular rhythm.  No murmur heard. Pulmonary/Chest: Effort normal and breath sounds normal. No respiratory distress.  Abdominal: Soft. There is no tenderness.  Nontender to light and deep palpation throughout all 4 quadrants and epigastrium  Genitourinary:  Genitourinary Comments: Deferred  Musculoskeletal: She exhibits no edema.  Neurological: She is alert.  Skin: Skin is warm and dry.  Psychiatric: She has a normal mood and affect.  Nursing note and vitals reviewed.    UC Treatments / Results  Labs (all labs ordered are listed, but only abnormal results are displayed) Labs Reviewed  POCT URINALYSIS DIP (DEVICE) - Abnormal; Notable for the following components:      Result Value   pH 8.5 (*)    Leukocytes, UA TRACE (*)    All other components within normal limits  POCT PREGNANCY, URINE  CERVICOVAGINAL ANCILLARY ONLY    EKG None  Radiology No results found.  Procedures Procedures (including critical care time)  Medications Ordered in UC Medications - No data to display  Initial Impression / Assessment and Plan / UC Course  I have reviewed the triage vital signs and the nursing notes.  Pertinent labs & imaging results that were available during my care of the patient were reviewed by me and considered in my medical decision making (see chart for details).     Patient likely with yeast infection as cause of vaginal discharge given worsening of  symptoms with antibiotic use.  We will go ahead and have patient finish course of metronidazole given her history of BV, one Diflucan today, may repeat after finishing Flagyl.  Vaginal swab obtained and will send off to confirm, will alter treatment as needed.Discussed strict return precautions. Patient verbalized understanding and is agreeable with plan.  Final Clinical Impressions(s) / UC Diagnoses  Final diagnoses:  Vaginal discharge     Discharge Instructions     Please continue to finish the full course of metronidazole to treat BV.  Please take 1 tablet of Diflucan today, may take another tablet 1 finish with the course of metronidazole.  Do not drink alcohol while taking metronidazole.  We are testing you for Gonorrhea, Chlamydia, Trichomonas, Yeast and Bacterial Vaginosis. We will call you if anything is positive and let you know if you require any further treatment. Please inform partners of any positive results.   Please return if symptoms not improving with treatment, development of fever, nausea, vomiting, abdominal pain.    ED Prescriptions    Medication Sig Dispense Auth. Provider   fluconazole (DIFLUCAN) 150 MG tablet Take 1 tablet (150 mg total) by mouth once for 1 dose. 2 tablet Ianmichael Amescua C, PA-C     Controlled Substance Prescriptions [place] Controlled Substance Registry consulted? Not Applicable   Lew DawesWieters, Corrin Hingle C, [place]PA-C 08/27/17 1109

## 2017-08-28 LAB — CERVICOVAGINAL ANCILLARY ONLY
BACTERIAL VAGINITIS: NEGATIVE
CANDIDA VAGINITIS: POSITIVE — AB
CHLAMYDIA, DNA PROBE: NEGATIVE
NEISSERIA GONORRHEA: NEGATIVE
Trichomonas: NEGATIVE

## 2017-08-30 ENCOUNTER — Telehealth (HOSPITAL_COMMUNITY): Payer: Self-pay

## 2017-08-30 NOTE — Telephone Encounter (Signed)
Candida (yeast) is positive.  Prescription for fluconazole was given at the urgent care visit.    Pt called and left message stating she is not feeling better, per Dorene GrebeNatalie FNP pt can try OTC monistat 7 but should follow up with OBGYN.  Attempted to reach patient. No answer at this time. Voicemail left.   Message sent to MyChart.

## 2017-09-11 ENCOUNTER — Other Ambulatory Visit: Payer: Self-pay

## 2017-09-11 ENCOUNTER — Emergency Department (HOSPITAL_COMMUNITY)
Admission: EM | Admit: 2017-09-11 | Discharge: 2017-09-11 | Disposition: A | Discharge status: Home / Self Care | Payer: Managed Care, Other (non HMO) | Attending: Emergency Medicine | Admitting: Emergency Medicine

## 2017-09-11 ENCOUNTER — Encounter (HOSPITAL_COMMUNITY): Payer: Self-pay

## 2017-09-11 ENCOUNTER — Emergency Department (HOSPITAL_COMMUNITY): Payer: Managed Care, Other (non HMO)

## 2017-09-11 DIAGNOSIS — R0789 Other chest pain: Secondary | ICD-10-CM | POA: Diagnosis not present

## 2017-09-11 DIAGNOSIS — R079 Chest pain, unspecified: Secondary | ICD-10-CM | POA: Diagnosis present

## 2017-09-11 DIAGNOSIS — Z7982 Long term (current) use of aspirin: Secondary | ICD-10-CM | POA: Insufficient documentation

## 2017-09-11 LAB — BASIC METABOLIC PANEL
Anion gap: 8 (ref 5–15)
BUN: 12 mg/dL (ref 6–20)
CHLORIDE: 105 mmol/L (ref 101–111)
CO2: 25 mmol/L (ref 22–32)
Calcium: 9.4 mg/dL (ref 8.9–10.3)
Creatinine, Ser: 0.91 mg/dL (ref 0.44–1.00)
GFR calc Af Amer: >60 mL/min (ref >60)
GFR calc non Af Amer: >60 mL/min (ref >60)
GLUCOSE: 103 mg/dL — AB (ref 65–99)
POTASSIUM: 4.1 mmol/L (ref 3.5–5.1)
Sodium: 138 mmol/L (ref 135–145)

## 2017-09-11 LAB — CBC
HEMATOCRIT: 34.7 % — AB (ref 36.0–46.0)
Hemoglobin: 10.8 g/dL — ABNORMAL LOW (ref 12.0–15.0)
MCH: 29.2 pg (ref 26.0–34.0)
MCHC: 31.1 g/dL (ref 30.0–36.0)
MCV: 93.8 fL (ref 78.0–100.0)
Platelets: 292 10*3/uL (ref 150–400)
RBC: 3.7 MIL/uL — ABNORMAL LOW (ref 3.87–5.11)
RDW: 13.1 % (ref 11.5–15.5)
WBC: 5.9 10*3/uL (ref 4.0–10.5)

## 2017-09-11 LAB — I-STAT BETA HCG BLOOD, ED (MC, WL, AP ONLY)

## 2017-09-11 LAB — I-STAT TROPONIN, ED
TROPONIN I, POC: 0 ng/mL (ref 0.00–0.08)
Troponin i, poc: 0 ng/mL (ref 0.00–0.08)

## 2017-09-11 LAB — D-DIMER, QUANTITATIVE: D-Dimer, Quant: 0.35 ug/mL-FEU (ref 0.00–0.50)

## 2017-09-11 MED ORDER — IBUPROFEN 400 MG PO TABS
600.0000 mg | ORAL_TABLET | Freq: Once | ORAL | Status: AC
  Administered 2017-09-11: 21:00:00 600 mg via ORAL
  Filled 2017-09-11: qty 1

## 2017-09-11 MED ORDER — KETOROLAC TROMETHAMINE 30 MG/ML IJ SOLN
15.0000 mg | Freq: Once | INTRAMUSCULAR | Status: DC
  Filled 2017-09-11: qty 1

## 2017-09-11 MED ORDER — HYDROCODONE-ACETAMINOPHEN 5-325 MG PO TABS
1.0000 | ORAL_TABLET | Freq: Once | ORAL | Status: AC
  Administered 2017-09-11: 1 via ORAL
  Filled 2017-09-11: qty 1

## 2017-09-11 NOTE — ED Notes (Signed)
ED PA at the bedside 

## 2017-09-11 NOTE — ED Triage Notes (Signed)
Pt states that she was driving home today and had a sudden onset of heavy sharp stabbing pain. Pt reports pain was central and radiated into left arm. Pt reports she was lightheaded with some SOB, denies NV.

## 2017-09-11 NOTE — ED Provider Notes (Signed)
[facility] Provider Note   CSN: [id number] Arrival date & time: 09/11/17  1837     History   Chief Complaint Chief Complaint  Patient presents with  . Chest Pain    HPI Linda Fuller is a 27 y.o. female.  HPI   Linda Fuller is a 27 y.o. female, with a history of GERD, presenting to the ED with chest pain that occurred around 5:30 PM today.  States she was driving her car when she had sudden onset of central chest pressure, 10/10, radiating to the left arm, accompanied by shortness of breath.  This intensity of pressure lasted for approximately 20 to 30 minutes.  Continues to complain of central, nonradiating chest pressure rated 3/10.  Increased pain with deep breathing.  Denies fever/chills, cough, N/V/D, abdominal pain, dizziness, diaphoresis, peripheral edema or pain, or any other complaints. Denies history of PE/DVT, recent trauma, recent travel, recent surgeries, or hormone use.    Past Medical History:  Diagnosis Date  . Anemia   . ANXIETY 01/27/2010  . CONSTIPATION 09/15/2009  . Depression   . GERD (gastroesophageal reflux disease)   . IBS (irritable bowel syndrome)     Patient Active Problem List   Diagnosis Date Noted  . IBS (irritable bowel syndrome) 08/03/2016  . Counseling for initiation of birth control method 08/03/2016  . Anemia, iron deficiency 08/03/2016  . Abdominal pain, RUQ 10/07/2015  . Acute pharyngitis 04/29/2015  . Eczema 04/29/2015  . Painful breasts 01/29/2015  . Fatigue 09/10/2014  . Migraine 09/10/2014  . GERD (gastroesophageal reflux disease) 09/10/2014  . Obesity 09/10/2014  . STD exposure 09/10/2014  . Acute cystitis 11/12/2013  . Delayed menses 09/04/2013  . Chronic constipation 01/22/2013  . Hematochezia 01/22/2013  . Rash and nonspecific skin eruption 01/22/2013  . Genital herpes 06/12/2012  . Postpartum care following vaginal delivery (7/20) 10/15/2011  . Preventative health care  08/16/2010  . Anxiety and depression 01/27/2010  . BACK PAIN 10/03/2008    Past Surgical History:  Procedure Laterality Date  . NO PAST SURGERIES    . THERAPEUTIC ABORTION     elective     OB History    Gravida  3   Para  1   Term  1   Preterm  0   AB  1   Living  1     SAB  0   TAB  1   Ectopic  0   Multiple  0   Live Births  1            Home Medications    Prior to Admission medications   Medication Sig Start Date End Date Taking? Authorizing Provider  aspirin 81 MG chewable tablet Chew 81 mg by mouth once.   Yes [provider]    Family History Family History  Problem Relation Age of Onset  . Heart disease Mother        Pacemaker   . Diabetes Mother   . Hypertension Mother   . Stroke Maternal Grandmother   . Diabetes Maternal Grandmother   . Hypertension Maternal Grandmother   . Stroke Maternal Grandfather   . Heart disease Maternal Grandfather   . Hypertension Father   . Colon cancer Neg Hx     Social History Social History   Tobacco Use  . Smoking status: Never Smoker  . Smokeless tobacco: Never Used  Substance Use Topics  . Alcohol use: Yes    Comment: last  was a month ago  . Drug use: Yes    Types: Marijuana     Allergies   Patient has no known allergies.   Review of Systems Review of Systems  Constitutional: Negative for chills, diaphoresis and fever.  Respiratory: Positive for shortness of breath. Negative for cough.   Cardiovascular: Positive for chest pain. Negative for leg swelling.  Gastrointestinal: Negative for abdominal pain, diarrhea, nausea and vomiting.  Neurological: Negative for dizziness, syncope and light-headedness.  All other systems reviewed and are negative.    Physical Exam Updated Vital Signs BP (!) 137/98 (BP Location: Left Arm)   Pulse 70   Temp 99 F (37.2 C) (Oral)   Resp 18   Ht 5\' 9"  (1.753 m)   Wt 95.3 kg (210 lb)   LMP 09/11/2017   SpO2 100%   BMI 31.01 kg/m    Physical Exam  Constitutional: She appears well-developed and well-nourished. No distress.  HENT:  Head: Normocephalic and atraumatic.  Eyes: Conjunctivae are normal.  Neck: Neck supple.  Cardiovascular: Normal rate, regular rhythm, normal heart sounds and intact distal pulses.  Pulmonary/Chest: Effort normal and breath sounds normal. No respiratory distress.  No increased work of breathing.  Patient speaks in full sentences without difficulty.  Abdominal: Soft. There is no tenderness. There is no guarding.  Musculoskeletal: She exhibits no edema.  Lymphadenopathy:    She has no cervical adenopathy.  Neurological: She is alert.  Skin: Skin is warm and dry. She is not diaphoretic.  Psychiatric: She has a normal mood and affect. Her behavior is normal.  Nursing note and vitals reviewed.    ED Treatments / Results  Labs (all labs ordered are listed, but only abnormal results are displayed) Labs Reviewed  BASIC METABOLIC PANEL - Abnormal; Notable for the following components:      Result Value   Glucose, Bld 103 (*)    All other components within normal limits  CBC - Abnormal; Notable for the following components:   RBC 3.70 (*)    Hemoglobin 10.8 (*)    HCT 34.7 (*)    All other components within normal limits  D-DIMER, QUANTITATIVE (NOT AT [facility])  I-STAT TROPONIN, ED  I-STAT BETA HCG BLOOD, ED ([facility], [facility], [facility] ONLY)  I-STAT TROPONIN, ED    EKG EKG Interpretation  Date/Time:  Monday September 11 2017 18:42:47 EDT Ventricular Rate:  69 PR Interval:  158 QRS Duration: 78 QT Interval:  390 QTC Calculation: 417 R Axis:   48 Text Interpretation:  Normal sinus rhythm with sinus arrhythmia Nonspecific T wave abnormality Abnormal ECG Confirmed by Kristine Royal [id number]) on 09/11/2017 9:25:04 PM   Radiology Dg Chest 2 View  Result Date: 09/11/2017 CLINICAL DATA:  Right-sided chest pain and shortness of breath for 2 hours EXAM: CHEST - 2 VIEW COMPARISON:  01/28/2010 FINDINGS: The  heart size and mediastinal contours are within normal limits. Both lungs are clear. The visualized skeletal structures are unremarkable. IMPRESSION: No active cardiopulmonary disease. Electronically Signed   By: Alcide Clever M.D.   On: 09/11/2017 19:39    Procedures Procedures (including critical care time)  Medications Ordered in ED Medications  HYDROcodone-acetaminophen (NORCO/VICODIN) 5-325 MG per tablet 1 tablet (1 tablet Oral Given 09/11/17 2102)  ibuprofen (ADVIL,MOTRIN) tablet 600 mg (600 mg Oral Given 09/11/17 2102)     Initial Impression / Assessment and Plan / ED Course  I have reviewed the triage vital signs and the nursing notes.  Pertinent labs & imaging results that  were available during my care of the patient were reviewed by me and considered in my medical decision making (see chart for details).  Clinical Course as of Sep 12 2315  Mon Sep 11, 2017  2035 Consistent with previous values  Hemoglobin(!): 10.8 [SJ]  2128 Patient's pain has resolved. Denies other complaints.    [SJ]    Clinical Course User Index [SJ] Benigna Delisi C, PA-C    Patient presents with episode of chest pain.  Began to resolve spontaneously.  Nontoxic-appearing. Low suspicion for ACS. HEART score is 2, indicating low risk for a cardiac event. Wells criteria score is 0, indicating low risk for PE.  D-dimer negative.  Delta troponins negative.  Pain-free shortly into ED course without recurrence. The patient was given instructions for home care as well as return precautions. Patient voices understanding of these instructions, accepts the plan, and is comfortable with discharge.  Vitals:   09/11/17 1845 09/11/17 1848 09/11/17 2307  BP:  (!) 137/98 126/82  Pulse:  70 67  Resp:  18 16  Temp:  99 F (37.2 C) 98.8 F (37.1 C)  TempSrc:  Oral Oral  SpO2:  100% 100%  Weight: 95.3 kg (210 lb)    Height: 5\' 9"  (1.753 m)      Final Clinical Impressions(s) / ED Diagnoses   Final diagnoses:  Atypical  chest pain    ED Discharge Orders    None       Concepcion Living 09/11/17 2319    Wynetta Fines, MD 09/11/17 2358

## 2017-09-11 NOTE — Discharge Instructions (Addendum)
Lab work and imaging was encouraging. Please follow up with your primary care provider and cardiology, as needed, on this issue.

## 2017-09-12 ENCOUNTER — Encounter: Payer: Self-pay | Admitting: Obstetrics & Gynecology

## 2017-09-12 ENCOUNTER — Other Ambulatory Visit (HOSPITAL_COMMUNITY)
Admission: RE | Admit: 2017-09-12 | Discharge: 2017-09-12 | Disposition: A | Discharge status: Home / Self Care | Payer: Managed Care, Other (non HMO) | Source: Ambulatory Visit | Attending: Obstetrics & Gynecology | Admitting: Obstetrics & Gynecology

## 2017-09-12 ENCOUNTER — Ambulatory Visit (INDEPENDENT_AMBULATORY_CARE_PROVIDER_SITE_OTHER): Payer: Managed Care, Other (non HMO) | Admitting: Obstetrics & Gynecology

## 2017-09-12 ENCOUNTER — Other Ambulatory Visit: Payer: Self-pay

## 2017-09-12 VITALS — BP 108/76 | HR 70 | Resp 16 | Ht 67.75 in | Wt 227.0 lb

## 2017-09-12 DIAGNOSIS — N760 Acute vaginitis: Secondary | ICD-10-CM | POA: Diagnosis not present

## 2017-09-12 DIAGNOSIS — K581 Irritable bowel syndrome with constipation: Secondary | ICD-10-CM

## 2017-09-12 DIAGNOSIS — Z124 Encounter for screening for malignant neoplasm of cervix: Secondary | ICD-10-CM | POA: Diagnosis not present

## 2017-09-12 DIAGNOSIS — N921 Excessive and frequent menstruation with irregular cycle: Secondary | ICD-10-CM

## 2017-09-12 DIAGNOSIS — N870 Mild cervical dysplasia: Secondary | ICD-10-CM | POA: Insufficient documentation

## 2017-09-12 DIAGNOSIS — Z01419 Encounter for gynecological examination (general) (routine) without abnormal findings: Secondary | ICD-10-CM | POA: Diagnosis not present

## 2017-09-12 DIAGNOSIS — R9431 Abnormal electrocardiogram [ECG] [EKG]: Secondary | ICD-10-CM | POA: Diagnosis not present

## 2017-09-12 NOTE — Progress Notes (Signed)
Patient scheduled while in office for PUS on 09/21/17 at 2:30pm, consult to follow at 3pm with Dr. Hyacinth MeekerMiller. Patient verbalizes understanding and is agreeable.

## 2017-09-12 NOTE — Progress Notes (Signed)
27 y.o. Linda Fuller MarriedAfrican AmericanF here for annual exam.  She is here as a new patient.  She went to the ER yesterday due to atypical chest pain.  Was advised her cardiac function was not "normal" and was recommended to see a cardiologist.  She has not made this appt yet.    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    Cycles are regular but actual flow has changed.  Before her cycle, she has significant itching and burning.  Flow lasts about 5 days but then she has brownish discharge for another five days.  Flow is heavy and with cramping.  She does have clots as well.  Blood is dark red.  Uses always overnight and has to change every two hours.  Was seen at urgent care 08/26/17 showing yeast only.  GC/Chl testing was negative.  Patient's last menstrual period was 09/11/2017.          Sexually active: Yes.    The current method of family planning is none.    Exercising: Yes.    cardio Smoker:  Marijuana use  Health Maintenance: Pap:  unsure History of abnormal Pap:  no MMG:  2016 u/s Colonoscopy:  2015 neg per patient BMD:   none TDaP:  2013 Pneumonia vaccine(s):  no Shingrix:   no Hep C testing: not done Screening Labs: not needed today   reports that she has never smoked. She has never used smokeless tobacco. She reports that she drinks alcohol. She reports that she has current or past drug history. Drug: Marijuana.  Past Medical History:  Diagnosis Date  . Anemia   . ANXIETY 01/27/2010  . CONSTIPATION 09/15/2009  . Depression   . GERD (gastroesophageal reflux disease)   . IBS (irritable bowel syndrome)   . Migraines    with aura  . Palpitation   . STD (sexually transmitted disease)    HSV2    Past Surgical History:  Procedure Laterality Date  . NO PAST SURGERIES    . THERAPEUTIC ABORTION     elective    Current Outpatient Medications  Medication Sig Dispense Refill  . linaclotide (LINZESS) 290 MCG CAPS capsule Take 290 mcg by mouth daily before breakfast.     No  current facility-administered medications for this visit.     Family History  Problem Relation Age of Onset  . Heart disease Mother        Pacemaker   . Diabetes Mother   . Hypertension Mother   . Stroke Maternal Grandmother   . Diabetes Maternal Grandmother   . Hypertension Maternal Grandmother   . Stroke Maternal Grandfather   . Heart disease Maternal Grandfather   . Hypertension Maternal Grandfather   . Hypertension Father     Review of Systems  Genitourinary:       Vaginal discharge  All other systems reviewed and are negative.   Exam:   BP 108/76   Pulse 70   Resp 16   Ht 5' 7.75" (1.721 m)   Wt 227 lb (103 kg)   LMP 09/11/2017   BMI 34.77 kg/m    Height: 5' 7.75" (172.1 cm)  Ht Readings from Last 3 Encounters:  09/12/17 5' 7.75" (1.721 m)  09/11/17 5\' 9"  (1.753 m)  08/03/16 5\' 9"  (1.753 m)    General appearance: alert, cooperative and appears stated age Head: Normocephalic, without obvious abnormality, atraumatic Neck: no adenopathy, supple, symmetrical, trachea midline and thyroid normal to inspection and palpation Lungs: clear to auscultation bilaterally Breasts:  normal appearance, no masses or tenderness Heart: regular rate and rhythm Abdomen: soft, non-tender; bowel sounds normal; no masses,  no organomegaly Extremities: extremities normal, atraumatic, no cyanosis or edema Skin: Skin color, texture, turgor normal. No rashes or lesions Lymph nodes: Cervical, supraclavicular, and axillary nodes normal. No abnormal inguinal nodes palpated Neurologic: Grossly normal   Pelvic: External genitalia:  no lesions              Urethra:  normal appearing urethra with no masses, tenderness or lesions              Bartholins and Skenes: normal                 Vagina: normal appearing vagina with normal color and discharge, no lesions              Cervix: no lesions              Pap taken: Yes.   Bimanual Exam:  Uterus:  normal size, contour, position,  consistency, mobility, non-tender              Adnexa: normal adnexa and no mass, fullness, tenderness               Rectovaginal: Confirms               Anus:  normal sphincter tone, no lesions  Chaperone was present for exam.  A:  Well Woman with normal exam Abnormal EKG in ER last night IBS, constipated Menorrhagia Recurrent vaginitis  P:   Mammogram guidelines reviewed pap smear obtained today Referral to GI made today Referral to cardiology made today as well.  Desires to see her mother's cardiologist--Dr. Sharlot GowdaGregg Return for PUS Affirm obtained today Return annually or prn

## 2017-09-13 LAB — VAGINITIS/VAGINOSIS, DNA PROBE
CANDIDA SPECIES: NEGATIVE
Gardnerella vaginalis: NEGATIVE
Trichomonas vaginosis: NEGATIVE

## 2017-09-17 LAB — CYTOLOGY - PAP

## 2017-09-18 ENCOUNTER — Other Ambulatory Visit: Payer: Self-pay | Admitting: *Deleted

## 2017-09-18 DIAGNOSIS — R87612 Low grade squamous intraepithelial lesion on cytologic smear of cervix (LGSIL): Secondary | ICD-10-CM

## 2017-09-21 ENCOUNTER — Ambulatory Visit (INDEPENDENT_AMBULATORY_CARE_PROVIDER_SITE_OTHER): Payer: Managed Care, Other (non HMO) | Admitting: Obstetrics & Gynecology

## 2017-09-21 ENCOUNTER — Ambulatory Visit (INDEPENDENT_AMBULATORY_CARE_PROVIDER_SITE_OTHER): Payer: Managed Care, Other (non HMO)

## 2017-09-21 ENCOUNTER — Other Ambulatory Visit: Payer: Self-pay | Admitting: Gastroenterology

## 2017-09-21 VITALS — BP 130/76 | HR 80 | Resp 16 | Ht 67.75 in | Wt 226.0 lb

## 2017-09-21 DIAGNOSIS — N921 Excessive and frequent menstruation with irregular cycle: Secondary | ICD-10-CM | POA: Diagnosis not present

## 2017-09-21 DIAGNOSIS — R87612 Low grade squamous intraepithelial lesion on cytologic smear of cervix (LGSIL): Secondary | ICD-10-CM

## 2017-09-21 DIAGNOSIS — R1011 Right upper quadrant pain: Secondary | ICD-10-CM

## 2017-09-21 DIAGNOSIS — R9389 Abnormal findings on diagnostic imaging of other specified body structures: Secondary | ICD-10-CM

## 2017-09-21 MED ORDER — NORETHINDRONE 0.35 MG PO TABS: 1.0000 | ORAL_TABLET | Freq: Every day | ORAL | 3 refills | Status: DC

## 2017-09-21 NOTE — Progress Notes (Signed)
27 y.o. 123P1 Married [place] female here for ultrasound for additional evaluation of menorrhagia and irregular bleeding.  At exam on 09/12/17, she did have a LGSIL pap smear.  Reviewed this with pt today.  she would like to proceed with evaluation today if that is possible.  Ultrasound: Uterus:  7.7 x 5.9 x 3.1cm Endometrium:  6-4512mm Left ovary:  2.9 x 2.3 x 1.7cm Right ovary:  3.2 x 2.3 x 2.3cm with 2.1 x 1.7cm follicle Cul de sac:  Negative  Discussed results with pt.  She just has her cycle on 09/11/17 and there is thickening of the endometrium.  Likelihood of pathologic abnormality is low so feel treatment with conservative options reviewed.  Pt is comfortable with using micronor for 3 months and then proceed with repeat ultrasound to evaluate endometrium.    For Colposcopy, patient has been counseled about results and procedure.  Risks and benefits have bene reviewed including immediate and/or delayed bleeding, infection, cervical scaring from procedure, possibility of needing additional follow up as well as treatment.  rare risks of missing a lesion discussed as well.  All questions answered.  Pt ready to proceed.  BP 130/76 (BP Location: Right Arm, Patient Position: Sitting, Cuff Size: Large)   Pulse 80   Resp 16   Ht 5' 7.75" (1.721 m)   Wt 226 lb (102.5 kg)   LMP 09/11/2017   BMI 34.62 kg/m   Physical Exam  Constitutional: She is oriented to person, place, and time. She appears well-developed and well-nourished.  Genitourinary: Vagina normal. There is no rash, tenderness or lesion on the right labia. There is no rash, tenderness, lesion or injury on the left labia.    Lymphadenopathy:       Right: No inguinal adenopathy present.       Left: No inguinal adenopathy present.  Neurological: She is alert and oriented to person, place, and time.  Skin: Skin is warm and dry.  Psychiatric: She has a normal mood and affect.   Speculum placed.  3% acetic acid applied to cervix  for >45 seconds.  Cervix visualized with both 7.5X and 15X magnification.  Green filter also used.  Lugols solution was used.  Findings:  AWE and Decreased area of staining with Lugol's noted at 12 o'clock.  Biopsy:  Obtained at this location.  ECC:  was not performed.  Monsel's was needed.  Excellent hemostasis was present.  Pt tolerated procedure well and all instruments were removed.  Findings noted above on picture of cervix.  Assessment:  Menorrhagia with prolong cycles Thickened endometrium LGSIL pap smear  Plan:  Pathology results will be called to patient and follow-up planned pending results. She will start micronor now.  rx to pharmacy with RFs x 3 months. Plan to repeat PUS in 3 months.  ~15 minutes spent with patient >50% of time was in face to face discussion of ultrasound results as well as options for treatment/follow up.

## 2017-09-23 ENCOUNTER — Telehealth: Payer: Self-pay | Admitting: Internal Medicine

## 2017-09-23 ENCOUNTER — Encounter: Payer: Self-pay | Admitting: Obstetrics & Gynecology

## 2017-09-23 NOTE — Telephone Encounter (Signed)
Having LUQ and RUQ pain and nausea and vomiting Is feeling better now but similar to prior spells and wants to see if she can move HIDA scan appt up  Scheduled for 7/8  I told her to call Dr. Loreta AveMann 7/1 to see  If fails to resolve or worsens, etc [facility] but concerned about the sxs and wants earlier appt

## 2017-09-26 ENCOUNTER — Telehealth: Payer: Self-pay | Admitting: *Deleted

## 2017-09-26 NOTE — Telephone Encounter (Signed)
Notes recorded by Leda MinHamm, Jennye Runquist N, RN on 09/26/2017 at 5:19 PM EDT Left message to call Noreene LarssonJill at [phone].  08 recall placed.

## 2017-09-26 NOTE — Telephone Encounter (Signed)
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  She does need a follow up pap and HR HPV test in one year.  Needs AEX scheduled for this.  08 recall.  Thanks.  CC:  Gara Kronereina Morales

## 2017-09-27 ENCOUNTER — Other Ambulatory Visit: Payer: Self-pay | Admitting: Gastroenterology

## 2017-09-27 ENCOUNTER — Encounter (HOSPITAL_COMMUNITY)
Admission: RE | Admit: 2017-09-27 | Discharge: 2017-09-27 | Disposition: A | Discharge status: Home / Self Care | Payer: Managed Care, Other (non HMO) | Source: Ambulatory Visit | Attending: Gastroenterology | Admitting: Gastroenterology

## 2017-09-27 ENCOUNTER — Ambulatory Visit (HOSPITAL_COMMUNITY)
Admission: RE | Admit: 2017-09-27 | Discharge: 2017-09-27 | Disposition: A | Discharge status: Home / Self Care | Payer: Managed Care, Other (non HMO) | Source: Ambulatory Visit | Attending: Gastroenterology | Admitting: Gastroenterology

## 2017-09-27 DIAGNOSIS — R1011 Right upper quadrant pain: Secondary | ICD-10-CM

## 2017-09-27 DIAGNOSIS — K811 Chronic cholecystitis: Secondary | ICD-10-CM | POA: Insufficient documentation

## 2017-09-27 MED ORDER — TECHNETIUM TC 99M MEBROFENIN IV KIT
5.0000 | PACK | Freq: Once | INTRAVENOUS | Status: AC | PRN
  Administered 2017-09-27: 5 via INTRAVENOUS

## 2017-09-27 MED ORDER — MORPHINE SULFATE (PF) 4 MG/ML IV SOLN
3.0000 mg | Freq: Once | INTRAVENOUS | Status: AC
  Administered 2017-09-27: 3 mg via INTRAVENOUS

## 2017-09-27 MED ORDER — MORPHINE SULFATE (PF) 4 MG/ML IV SOLN
INTRAVENOUS | Status: AC
  Filled 2017-09-27: qty 1

## 2017-09-27 NOTE — Telephone Encounter (Signed)
Message left to return call to Triage Nurse at [phone].    

## 2017-09-27 NOTE — Telephone Encounter (Signed)
[facility].

## 2017-09-27 NOTE — Telephone Encounter (Signed)
Patient returned call. Results reviewed with patient and she verbalized understanding. Patient agreeable to schedule aex. AEX scheduled for Friday 09-21-18 at 0830. Patient agreeable to date and time of appointment. 08 recall in place for 08-2018.   Routing to provider for final review. Patient agreeable to disposition. Will close encounter.

## 2017-10-02 ENCOUNTER — Other Ambulatory Visit (HOSPITAL_COMMUNITY): Payer: Managed Care, Other (non HMO)

## 2017-10-02 ENCOUNTER — Ambulatory Visit (HOSPITAL_COMMUNITY): Payer: Managed Care, Other (non HMO)

## 2017-10-31 ENCOUNTER — Ambulatory Visit: Payer: Managed Care, Other (non HMO) | Admitting: Cardiology

## 2017-11-01 ENCOUNTER — Encounter: Payer: Self-pay | Admitting: *Deleted

## 2018-02-19 ENCOUNTER — Encounter (HOSPITAL_BASED_OUTPATIENT_CLINIC_OR_DEPARTMENT_OTHER): Payer: Self-pay | Admitting: Emergency Medicine

## 2018-02-19 ENCOUNTER — Other Ambulatory Visit: Payer: Self-pay

## 2018-02-19 DIAGNOSIS — R1084 Generalized abdominal pain: Secondary | ICD-10-CM | POA: Insufficient documentation

## 2018-02-19 DIAGNOSIS — K5900 Constipation, unspecified: Secondary | ICD-10-CM | POA: Insufficient documentation

## 2018-02-19 DIAGNOSIS — Z79899 Other long term (current) drug therapy: Secondary | ICD-10-CM | POA: Diagnosis not present

## 2018-02-19 DIAGNOSIS — R109 Unspecified abdominal pain: Secondary | ICD-10-CM | POA: Diagnosis present

## 2018-02-19 NOTE — ED Triage Notes (Signed)
Pt states she had her gallbladder removed in October  For the past two weeks she has been having problems with her IBS and feels constipated and feels bloated  Pt takes medication for her IBS  Pt states she was able to have a stool this morning but it was just watery

## 2018-02-20 ENCOUNTER — Emergency Department (HOSPITAL_BASED_OUTPATIENT_CLINIC_OR_DEPARTMENT_OTHER): Payer: Managed Care, Other (non HMO)

## 2018-02-20 ENCOUNTER — Emergency Department (HOSPITAL_BASED_OUTPATIENT_CLINIC_OR_DEPARTMENT_OTHER)
Admission: EM | Admit: 2018-02-20 | Discharge: 2018-02-20 | Disposition: A | Discharge status: Home / Self Care | Payer: Managed Care, Other (non HMO) | Attending: Emergency Medicine | Admitting: Emergency Medicine

## 2018-02-20 DIAGNOSIS — K5909 Other constipation: Secondary | ICD-10-CM

## 2018-02-20 DIAGNOSIS — R1084 Generalized abdominal pain: Secondary | ICD-10-CM

## 2018-02-20 LAB — CBC WITH DIFFERENTIAL/PLATELET
ABS IMMATURE GRANULOCYTES: 0.01 10*3/uL (ref 0.00–0.07)
Basophils Absolute: 0 10*3/uL (ref 0.0–0.1)
Basophils Relative: 1 %
EOS ABS: 0.1 10*3/uL (ref 0.0–0.5)
Eosinophils Relative: 2 %
HEMATOCRIT: 35.1 % — AB (ref 36.0–46.0)
HEMOGLOBIN: 11 g/dL — AB (ref 12.0–15.0)
IMMATURE GRANULOCYTES: 0 %
LYMPHS ABS: 2 10*3/uL (ref 0.7–4.0)
Lymphocytes Relative: 44 %
MCH: 29.2 pg (ref 26.0–34.0)
MCHC: 31.3 g/dL (ref 30.0–36.0)
MCV: 93.1 fL (ref 80.0–100.0)
MONOS PCT: 10 %
Monocytes Absolute: 0.5 10*3/uL (ref 0.1–1.0)
Neutro Abs: 2 10*3/uL (ref 1.7–7.7)
Neutrophils Relative %: 43 %
Platelets: 270 10*3/uL (ref 150–400)
RBC: 3.77 MIL/uL — ABNORMAL LOW (ref 3.87–5.11)
RDW: 13.2 % (ref 11.5–15.5)
WBC: 4.6 10*3/uL (ref 4.0–10.5)
nRBC: 0 % (ref 0.0–0.2)

## 2018-02-20 LAB — COMPREHENSIVE METABOLIC PANEL
ALK PHOS: 42 U/L (ref 38–126)
ALT: 16 U/L (ref 0–44)
AST: 17 U/L (ref 15–41)
Albumin: 2.7 g/dL — ABNORMAL LOW (ref 3.5–5.0)
Anion gap: 5 (ref 5–15)
BILIRUBIN TOTAL: 0.5 mg/dL (ref 0.3–1.2)
BUN: 7 mg/dL (ref 6–20)
CALCIUM: 9.1 mg/dL (ref 8.9–10.3)
CO2: 23 mmol/L (ref 22–32)
CREATININE: 0.68 mg/dL (ref 0.44–1.00)
Chloride: 109 mmol/L (ref 98–111)
GFR calc Af Amer: >60 mL/min (ref >60)
Glucose, Bld: 95 mg/dL (ref 70–99)
Potassium: 3.5 mmol/L (ref 3.5–5.1)
Sodium: 137 mmol/L (ref 135–145)
TOTAL PROTEIN: 6.9 g/dL (ref 6.5–8.1)

## 2018-02-20 LAB — URINALYSIS, ROUTINE W REFLEX MICROSCOPIC
Bilirubin Urine: NEGATIVE
GLUCOSE, UA: NEGATIVE mg/dL
HGB URINE DIPSTICK: NEGATIVE
KETONES UR: NEGATIVE mg/dL
Leukocytes, UA: NEGATIVE
Nitrite: NEGATIVE
PROTEIN: NEGATIVE mg/dL
Specific Gravity, Urine: 1.015 (ref 1.005–1.030)
pH: 7 (ref 5.0–8.0)

## 2018-02-20 LAB — LIPASE, BLOOD: LIPASE: 40 U/L (ref 11–51)

## 2018-02-20 MED ORDER — POLYETHYLENE GLYCOL 3350 17 GM/SCOOP PO POWD: ORAL | 0 refills | Status: DC

## 2018-02-20 NOTE — ED Provider Notes (Signed)
[facility] EMERGENCY DEPARTMENT Provider Note  CSN: [id number] Arrival date & time: 02/19/18 2334  Chief Complaint(s) Abdominal Pain  HPI Linda Fuller is a 27 y.o. female   The history is provided by the patient.  Abdominal Pain   This is a recurrent problem. Episode onset: 1 month following lap cholecystectomy. The problem occurs constantly. Progression since onset: fluctuating. The pain is associated with a previous surgery. The pain is located in the generalized abdominal region. The quality of the pain is colicky and cramping. Pain severity now: moderate to severe. Associated symptoms include diarrhea (watery bowel movements for a few months) and constipation (chronic due to IBS; taking Linzess). Pertinent negatives include fever, nausea, vomiting and dysuria. The symptoms are aggravated by eating. Nothing relieves the symptoms.    Past Medical History Past Medical History:  Diagnosis Date  . Anemia   . ANXIETY 01/27/2010  . Chronic constipation 01/22/2013  . CONSTIPATION 09/15/2009  . Depression   . GERD (gastroesophageal reflux disease)   . IBS (irritable bowel syndrome)   . Migraine 09/10/2014  . Migraines    with aura  . Obesity 09/10/2014  . Palpitation   . STD (sexually transmitted disease)    HSV2   Patient Active Problem List   Diagnosis Date Noted  . IBS (irritable bowel syndrome) 08/03/2016  . Abdominal pain, RUQ 10/07/2015  . Eczema 04/29/2015  . Migraine 09/10/2014  . GERD (gastroesophageal reflux disease) 09/10/2014  . Obesity 09/10/2014  . Chronic constipation 01/22/2013  . Anxiety and depression 01/27/2010  . BACK PAIN 10/03/2008   Home Medication(s) Prior to Admission medications   Medication Sig Start Date End Date Taking? Authorizing Provider  linaclotide (LINZESS) 290 MCG CAPS capsule Take 290 mcg by mouth daily before breakfast.    [provider]  norethindrone (MICRONOR,CAMILA,ERRIN) 0.35 MG tablet Take 1 tablet (0.35 mg  total) by mouth daily. 09/21/17   Jerene Bears, MD  polyethylene glycol powder [facility]) powder Please take 6 capfuls of MiraLAX in a 32 oz bottle of Gatorade over 2-4 hour period. The following day take 3 capfuls. On day 3 start taking 1 capful 3 times a day. Slowly cut back as needed until you have normal bowel movements. Ensure that you drink plenty of fluids during this process. 02/20/18   Nira Conn, MD                                                                                                                                    Past Surgical History Past Surgical History:  Procedure Laterality Date  . CHOLECYSTECTOMY    . NO PAST SURGERIES    . THERAPEUTIC ABORTION     elective   Family History Family History  Problem Relation Age of Onset  . Heart disease Mother        Pacemaker   . Diabetes Mother   . Hypertension Mother   .  Stroke Maternal Grandmother   . Diabetes Maternal Grandmother   . Hypertension Maternal Grandmother   . Stroke Maternal Grandfather   . Heart disease Maternal Grandfather   . Hypertension Maternal Grandfather   . Hypertension Father   . Hypertension Maternal Aunt   . Diabetes type I Maternal Aunt   . Heart attack Maternal Aunt   . Heart disease Maternal Aunt     Social History Social History   Tobacco Use  . Smoking status: Never Smoker  . Smokeless tobacco: Never Used  Substance Use Topics  . Alcohol use: Yes    Comment: social  . Drug use: Not Currently    Types: Marijuana   Allergies Patient has no known allergies.  Review of Systems Review of Systems  Constitutional: Negative for fever.  Gastrointestinal: Positive for abdominal pain, constipation (chronic due to IBS; taking Linzess) and diarrhea (watery bowel movements for a few months). Negative for nausea and vomiting.  Genitourinary: Negative for dysuria.   All other systems are reviewed and are negative for acute change except as noted in the HPI  Physical  Exam Vital Signs  I have reviewed the triage vital signs BP 115/73   Pulse (!) 59   Temp 98.3 F (36.8 C) (Oral)   Resp 16   Ht 5\' 9"  (1.753 m)   Wt 95.3 kg   LMP 02/13/2018 (Exact Date)   SpO2 99%   BMI 31.01 kg/m   Physical Exam  Constitutional: She is oriented to person, place, and time. She appears well-developed and well-nourished. No distress.  HENT:  Head: Normocephalic and atraumatic.  Right Ear: External ear normal.  Left Ear: External ear normal.  Nose: Nose normal.  Eyes: Conjunctivae and EOM are normal. No scleral icterus.  Neck: Normal range of motion and phonation normal.  Cardiovascular: Normal rate and regular rhythm.  Pulmonary/Chest: Effort normal. No stridor. No respiratory distress.  Abdominal: Soft. She exhibits no distension. There is tenderness (mild discomfort.) in the right upper quadrant, right lower quadrant and epigastric area. There is no rigidity, no rebound, no guarding, no tenderness at McBurney's point and negative Murphy's sign.  Trochar incisions well-healed  Musculoskeletal: Normal range of motion. She exhibits no edema.  Neurological: She is alert and oriented to person, place, and time.  Skin: She is not diaphoretic.  Psychiatric: She has a normal mood and affect. Her behavior is normal.  Vitals reviewed.   ED Results and Treatments Labs (all labs ordered are listed, but only abnormal results are displayed) Labs Reviewed  CBC WITH DIFFERENTIAL/PLATELET - Abnormal; Notable for the following components:      Result Value   RBC 3.77 (*)    Hemoglobin 11.0 (*)    HCT 35.1 (*)    All other components within normal limits  COMPREHENSIVE METABOLIC PANEL - Abnormal; Notable for the following components:   Albumin 2.7 (*)    All other components within normal limits  LIPASE, BLOOD  URINALYSIS, ROUTINE W REFLEX MICROSCOPIC  EKG  EKG Interpretation  Date/Time:    Ventricular Rate:    PR Interval:    QRS Duration:   QT Interval:    QTC Calculation:   R Axis:     Text Interpretation:        Radiology Dg Abdomen 1 View  Result Date: 02/20/2018 CLINICAL DATA:  Left-sided abdominal pain, bloating, and constipation for 2 months. One month postop from laparoscopic cholecystectomy. EXAM: ABDOMEN - 1 VIEW COMPARISON:  None. FINDINGS: No evidence of dilated bowel loops. Moderate amount of stool is seen in the ascending and transverse colon. No radiopaque calculi or other significant abnormality identified. IMPRESSION: No acute findings. Moderate amount of stool in ascending and transverse colon. Electronically Signed   By: Myles Rosenthal M.D.   On: 02/20/2018 02:25   Pertinent labs & imaging results that were available during my care of the patient were reviewed by me and considered in my medical decision making (see chart for details).  Medications Ordered in ED Medications - No data to display                                                                                                                                  Procedures Procedures  (including critical care time)  Medical Decision Making / ED Course I have reviewed the nursing notes for this encounter and the patient's prior records (if available in EHR or on provided paperwork).    Patient presents with exacerbation of chronic abdominal pain related to IBS-C.  She is well-appearing, well-hydrated, nontoxic and in no acute distress.  Abdomen with mild discomfort to the right side and epigastrium, but not peritonitic.   Given her recent surgery considered other etiologies such as possible small bowel obstruction; however her symptom pattern does not fit classically.  Screening labs were obtained and were grossly reassuring without leukocytosis, significant electrolyte derangements or renal insufficiency.  No evidence of biliary obstruction or  pancreatitis.  UA negative.  KUB notable for moderate stool burden in the ascending and transverse colon which correlates with her discomfort on palpation.  No evidence of obstruction.  Doubt any other serious intra-abdominal inflammatory/infectious process at this time.  Patient is able to tolerate oral intake.  Recommended bowel regimen and GI follow-up.  Strict return precautions were given.  The patient appears reasonably screened and/or stabilized for discharge and I doubt any other medical condition or other [facility] requiring further screening, evaluation, or treatment in the ED at this time prior to discharge.  The patient is safe for discharge with strict return precautions.   Final Clinical Impression(s) / ED Diagnoses Final diagnoses:  Generalized abdominal pain  Other constipation    Disposition: Discharge  Condition: Good  I have discussed the results, Dx and Tx plan with the patient who expressed understanding and agree(s) with the plan. Discharge instructions discussed at great length. The patient was given strict return precautions who verbalized understanding of  the instructions. No further questions at time of discharge.    ED Discharge Orders         Ordered    polyethylene glycol powder (MIRALAX) powder     02/20/18 0339           Follow Up: Gastroenterologist  Schedule an appointment as soon as possible for a visit       This chart was dictated using voice recognition software.  Despite best efforts to proofread,  errors can occur which can change the documentation meaning.   Nira Connardama,  Eduardo, MD 02/20/18 [phone]

## 2018-02-20 NOTE — ED Notes (Signed)
Pt states she feels as though she might be constipated. Pt reports minimal bowel movements since having her gall bladder removed. Pt states she has been dealing with abdominal distention and pain for the last 2 weeks.

## 2018-02-20 NOTE — ED Notes (Signed)
Patient transported to X-ray 

## 2018-02-20 NOTE — ED Notes (Signed)
Patient verbalizes understanding of discharge instructions. Opportunity for questioning and answers were provided. Armband removed by staff, pt discharged from ED home via POV with family. 

## 2018-03-23 ENCOUNTER — Telehealth: Payer: Self-pay | Admitting: Obstetrics & Gynecology

## 2018-03-23 NOTE — Telephone Encounter (Signed)
Patient has recurring vaginosis and believes it has flared up again. Would like prescription called in. Patient aware she may need to be seen. States she has no vacation time to take off work for an appointment.

## 2018-03-23 NOTE — Telephone Encounter (Signed)
Spoke with patient. Patient used a scented soap approximately 1 wk ago. Reports vaginal itching, odor and white to clear vaginal d/c since using the soap. Patient is requesting RX for Flagyl.   Advised patient will need OV for further evaluation, patient declined. Patient states she is unable to take any time off until 03/2018. Offered to look at scheduling options to meet scheduling needs, patient declined.  Advised patient to return call to office for OV if symptoms do not resolve, reviewed options of Urgent Care after hours.  Advised Dr. Hyacinth MeekerMiller will review, our office will return call if any additional recommendations.   Routing to provider for final review. Patient is agreeable to disposition. Will close encounter.

## 2018-03-26 ENCOUNTER — Encounter: Payer: Self-pay | Admitting: *Deleted

## 2018-04-20 ENCOUNTER — Other Ambulatory Visit: Payer: Self-pay | Admitting: Internal Medicine

## 2018-04-23 ENCOUNTER — Ambulatory Visit (INDEPENDENT_AMBULATORY_CARE_PROVIDER_SITE_OTHER): Payer: Managed Care, Other (non HMO) | Admitting: Obstetrics & Gynecology

## 2018-04-23 ENCOUNTER — Telehealth: Payer: Self-pay | Admitting: *Deleted

## 2018-04-23 ENCOUNTER — Other Ambulatory Visit: Payer: Self-pay | Admitting: Obstetrics & Gynecology

## 2018-04-23 ENCOUNTER — Encounter: Payer: Self-pay | Admitting: Obstetrics & Gynecology

## 2018-04-23 ENCOUNTER — Other Ambulatory Visit: Payer: Self-pay

## 2018-04-23 VITALS — BP 122/60 | HR 92 | Resp 16 | Ht 69.0 in | Wt 215.8 lb

## 2018-04-23 DIAGNOSIS — N841 Polyp of cervix uteri: Secondary | ICD-10-CM

## 2018-04-23 DIAGNOSIS — N939 Abnormal uterine and vaginal bleeding, unspecified: Secondary | ICD-10-CM

## 2018-04-23 DIAGNOSIS — N898 Other specified noninflammatory disorders of vagina: Secondary | ICD-10-CM

## 2018-04-23 DIAGNOSIS — N926 Irregular menstruation, unspecified: Secondary | ICD-10-CM

## 2018-04-23 LAB — POCT URINE PREGNANCY: PREG TEST UR: NEGATIVE

## 2018-04-23 LAB — BETA HCG QUANT (REF LAB): hCG Quant: <1 m[IU]/mL

## 2018-04-23 NOTE — Telephone Encounter (Signed)
Call from patient. States she woke this am with menstrual type cramping and went to bathroom. Passed brownish tissue with clot and blood similar to previous miscarriage. Still has cramping but no further bleeding, just pink discharge with wiping. Continues to have menstrual type cramping. LMP 3 weeks ago. Not using contraception. Reports had SAB approximately 1 year ago.  Blood type noted in Epic as A positive.  Appointment scheduled for 115 today with Dr Hyacinth Meeker.  To call back if any change in status before appointment.       Routing to Dr Hyacinth Meeker. Encounter closed.  CC: Gara Kroner, CMA

## 2018-04-23 NOTE — Progress Notes (Signed)
GYNECOLOGY  VISIT  CC:   Vaginal discharge and cramping   HPI: 28 y.o. [id number] Married [place] here for complaint of heavy cycles.  Passes very large clots when she is on her cycles.  Flow can last 3-5 days.  Cycles are now about every three weeks.  This changed over the past six months.  Prior to that, cycles were about every four weeks.    This month, she started having some pinkish discharge about a week ago.  Then, today she passed tissue that made her concerned about a miscarriage and this was associated with increased cramping today.  She's not having flow like her typical cycle.  Using withdrawal method.    Had her gallbladder removed 01/11/18 due to pain.  She has seen Dr. Loreta AveMann in the past.  Does not have RUQ pain but continues to have bloating and abdominal discomfort.  She continues to have constipation.  She is on Lizess twice weekly but she has really watery bowel movements even when she takes it only twice weekly.    GYNECOLOGIC HISTORY: Patient's last menstrual period was 03/30/2018 (exact date). Contraception: withdrawal  Menopausal hormone therapy: none  Patient Active Problem List   Diagnosis Date Noted  . IBS (irritable bowel syndrome) 08/03/2016  . Abdominal pain, RUQ 10/07/2015  . Eczema 04/29/2015  . Migraine 09/10/2014  . GERD (gastroesophageal reflux disease) 09/10/2014  . Obesity 09/10/2014  . Chronic constipation 01/22/2013  . Anxiety and depression 01/27/2010  . BACK PAIN 10/03/2008    Past Medical History:  Diagnosis Date  . Anemia   . ANXIETY 01/27/2010  . Chronic constipation 01/22/2013  . CONSTIPATION 09/15/2009  . Depression   . GERD (gastroesophageal reflux disease)   . IBS (irritable bowel syndrome)   . Migraine 09/10/2014  . Migraines    with aura  . Obesity 09/10/2014  . Palpitation   . STD (sexually transmitted disease)    HSV2    Past Surgical History:  Procedure Laterality Date  . CHOLECYSTECTOMY    . NO  PAST SURGERIES    . THERAPEUTIC ABORTION     elective    MEDS:   Current Outpatient Medications on File Prior to Visit  Medication Sig Dispense Refill  . linaclotide (LINZESS) 290 MCG CAPS capsule Take 290 mcg by mouth daily before breakfast.     No current facility-administered medications on file prior to visit.     ALLERGIES: Patient has no known allergies.  Family History  Problem Relation Age of Onset  . Heart disease Mother        Pacemaker   . Diabetes Mother   . Hypertension Mother   . Stroke Maternal Grandmother   . Diabetes Maternal Grandmother   . Hypertension Maternal Grandmother   . Stroke Maternal Grandfather   . Heart disease Maternal Grandfather   . Hypertension Maternal Grandfather   . Hypertension Father   . Hypertension Maternal Aunt   . Diabetes type I Maternal Aunt   . Heart attack Maternal Aunt   . Heart disease Maternal Aunt     SH:  Married, non smoker  Review of Systems  Constitutional:       Weight gain   Gastrointestinal: Positive for abdominal distention, abdominal pain and constipation.       Change in quality of stool   Endocrine: Positive for polydipsia.  Genitourinary: Positive for dyspareunia, menstrual problem, pelvic pain and vaginal discharge.  Skin:       Hair loss  All other systems reviewed and are negative.   PHYSICAL EXAMINATION:    BP 122/60 (BP Location: Right Arm, Patient Position: Sitting, Cuff Size: Large)   Pulse 92   Resp 16   Ht 5\' 9"  (1.753 m)   Wt 215 lb 12.8 oz (97.9 kg)   LMP 03/30/2018 (Exact Date)   BMI 31.87 kg/m     General appearance: alert, cooperative and appears stated age Abdomen: soft, non-tender; bowel sounds normal; no masses,  no organomegaly Lymph:  no inguinal LAD noted  Pelvic: External genitalia:  no lesions              Urethra:  normal appearing urethra with no masses, tenderness or lesions              Bartholins and Skenes: normal                 Vagina: normal appearing vagina  with normal color and discharge, no lesions              Cervix: cervical polyp noted today on exam              Bimanual Exam:  Uterus:  normal size, contour, position, consistency, mobility, non-tender              Adnexa: no mass, fullness, tenderness   Procedure: polyp grasped with ringed forceps and twisted at base to fully remove.  Once removed, no bleeding was noted.  Pt tolerated procedure well.  Chaperone was present for exam.  Assessment: Irregular bleeding, concerns about miscarriage although UPT negative today Watery diarrhea or significant constipation depending on how much or how little she takes Linzess Cervical polyp that was removed today  Plan: Stat HCG ordered CBC ordered Vaginitis testing obtained today including STD testing If above negative, would recommend OCPs for cycle regulation or trial for pregnancy if this is what she desires. Highly recommended follow up with Dr. Loreta Ave due to GI issues Polyp pathology pending   ~30 minutes spent with patient >50% of time was in face to face discussion of above.

## 2018-04-23 NOTE — Telephone Encounter (Signed)
04/23/18 beta Hcg negative. Results reviewed with Dr. Hyacinth Meeker, call returned to patient. Advised of negative results per Dr. Hyacinth Meeker. Will wait for pending labs results for further recommendations. Patient verbalizes understanding.   Routing to provider for final review. Patient is agreeable to disposition. Will close encounter.

## 2018-04-24 LAB — CBC
HEMOGLOBIN: 10.8 g/dL — AB (ref 11.1–15.9)
Hematocrit: 33.5 % — ABNORMAL LOW (ref 34.0–46.6)
MCH: 30 pg (ref 26.6–33.0)
MCHC: 32.2 g/dL (ref 31.5–35.7)
MCV: 93 fL (ref 79–97)
Platelets: 289 10*3/uL (ref 150–450)
RBC: 3.6 x10E6/uL — ABNORMAL LOW (ref 3.77–5.28)
RDW: 12.8 % (ref 11.7–15.4)
WBC: 5.6 10*3/uL (ref 3.4–10.8)

## 2018-04-24 LAB — TSH: TSH: 1.37 u[IU]/mL (ref 0.450–4.500)

## 2018-04-25 ENCOUNTER — Telehealth: Payer: Self-pay | Admitting: Obstetrics & Gynecology

## 2018-04-25 NOTE — Telephone Encounter (Signed)
Patient called requesting to speak with the nurse to see if her recent labs are back from 04/23/18. She said she thought they'd be back yesterday.

## 2018-04-26 ENCOUNTER — Encounter: Payer: Self-pay | Admitting: Obstetrics & Gynecology

## 2018-04-26 LAB — NUSWAB VAGINITIS PLUS (VG+)
Candida albicans, NAA: NEGATIVE
Candida glabrata, NAA: NEGATIVE
Chlamydia trachomatis, NAA: NEGATIVE
Neisseria gonorrhoeae, NAA: NEGATIVE
TRICH VAG BY NAA: NEGATIVE

## 2018-04-26 NOTE — Telephone Encounter (Signed)
Dr. Hyacinth Meeker -please review labs dated 04/23/18 and advise.

## 2018-04-26 NOTE — Telephone Encounter (Signed)
Lab results message routed to you.  Thanks.

## 2018-04-27 MED ORDER — NORETHIN ACE-ETH ESTRAD-FE 1-20 MG-MCG PO TABS: 1.0000 | ORAL_TABLET | Freq: Every day | ORAL | 0 refills | Status: DC

## 2018-04-27 NOTE — Telephone Encounter (Signed)
-----   Message from Jerene Bears, MD sent at 04/26/2018  5:23 PM EST ----- Please let pt know vaginitis testing was all negative including STD testing.  Cervical polyp was benign.  Thyroid was normal.  She already knows HCG was negative.    Her CBC showed some mild anemia with hb 10.8.  She should start some daily iron, Slow FE would be good.    I think she should start and OCP for a few months to get her cycles regular as she is deciding about trying for another pregnancy.  I would recommend Loestrin 1/20 FE and then plan recheck in 3 month with repeat CBC at that time.  This will help regulate bleeding and regulate cycle.  Of course she may say she has decided to start trying for pregnancy and this is ok as well but she's been on the fence about it since I started seeing her.

## 2018-04-27 NOTE — Telephone Encounter (Signed)
Patient returned call to [facility] and stated she'd like the details left on her voice mail if she is not available to take the return call.

## 2018-04-27 NOTE — Telephone Encounter (Signed)
Spoke with patient, advised as seen below per Dr. Hyacinth Meeker. Patient request to start OCP, RX to verified pharmacy. Patient currently on menses, will start with this cycle. Use BUM for 7 days. OV scheduled for 4/30 at 9:15am with Dr. Hyacinth Meeker. Patient verbalizes understanding and is agreeable to plan.   Routing to provider for final review. Patient is agreeable to disposition. Will close encounter.

## 2018-04-27 NOTE — Telephone Encounter (Signed)
Left message to call English Craighead, RN at [facility] [phone].   

## 2018-07-16 ENCOUNTER — Other Ambulatory Visit: Payer: Self-pay | Admitting: Obstetrics & Gynecology

## 2018-07-16 NOTE — Telephone Encounter (Signed)
Medication refill request: JUNEL Last AEX:  09/12/17 SM Next AEX: 09/21/18 Last MMG (if hormonal medication request): n/a Refill authorized: Please advise, patient has recheck for OCP 07/26/18. Order pended for #28 w/0 refills if authorized

## 2018-07-26 ENCOUNTER — Ambulatory Visit: Payer: Self-pay | Admitting: Obstetrics & Gynecology

## 2018-08-23 ENCOUNTER — Ambulatory Visit: Payer: Self-pay | Admitting: Obstetrics & Gynecology

## 2018-08-30 ENCOUNTER — Ambulatory Visit: Payer: Self-pay | Admitting: Obstetrics & Gynecology

## 2018-09-04 ENCOUNTER — Telehealth: Payer: Self-pay | Admitting: Obstetrics & Gynecology

## 2018-09-04 ENCOUNTER — Other Ambulatory Visit: Payer: Self-pay

## 2018-09-04 NOTE — Telephone Encounter (Signed)
Left voicemail for patient to call back to reschedule appt for Thursday.  Needs 30 minute slot.

## 2018-09-06 ENCOUNTER — Ambulatory Visit: Payer: Self-pay | Admitting: Obstetrics & Gynecology

## 2018-09-07 ENCOUNTER — Encounter: Payer: Self-pay | Admitting: Obstetrics & Gynecology

## 2018-09-07 ENCOUNTER — Ambulatory Visit (INDEPENDENT_AMBULATORY_CARE_PROVIDER_SITE_OTHER): Payer: Managed Care, Other (non HMO) | Admitting: Obstetrics & Gynecology

## 2018-09-07 ENCOUNTER — Other Ambulatory Visit: Payer: Self-pay

## 2018-09-07 VITALS — BP 120/80 | HR 84 | Temp 98.0°F | Ht 69.0 in | Wt 207.0 lb

## 2018-09-07 DIAGNOSIS — N939 Abnormal uterine and vaginal bleeding, unspecified: Secondary | ICD-10-CM

## 2018-09-07 NOTE — Progress Notes (Signed)
GYNECOLOGY  VISIT  CC:   OCP follow up   HPI: 28 y.o. [id number] Married [place] or [place] female here for irregular bleeding follow up.  She started Loloestrin in March and took them in April as well.  Cycles were regular and lighter on the pills.  She developed a rash and she noticed that she had more mood swings on the pills.  She stopped them after the April pack was finished.  She's had two cycles since then--May and June.  These two are regular.  Flow last month lasted about three days.  She just started today.  She has noticed that she gets a little more vaginal itching the day or two before her cycle.  This isn't any better but typically, she has this itchiness/irritation for several days.  She does not wear tampons.  She has not been wearing a tampon or a pad.    She has changed her diet to vegan diet two months ago.  Water intake has improved.  She has been off the Linzess for the past two months.    Reports her reflux is much improved.    GYNECOLOGIC HISTORY: Patient's last menstrual period was 09/07/2018. Contraception: OCP Menopausal hormone therapy: none  Patient Active Problem List   Diagnosis Date Noted  . IBS (irritable bowel syndrome) 08/03/2016  . Abdominal pain, RUQ 10/07/2015  . Eczema 04/29/2015  . Migraine 09/10/2014  . GERD (gastroesophageal reflux disease) 09/10/2014  . Obesity 09/10/2014  . Chronic constipation 01/22/2013  . Anxiety and depression 01/27/2010  . BACK PAIN 10/03/2008    Past Medical History:  Diagnosis Date  . Anemia   . ANXIETY 01/27/2010  . Chronic constipation 01/22/2013  . CONSTIPATION 09/15/2009  . Depression   . GERD (gastroesophageal reflux disease)   . IBS (irritable bowel syndrome)   . Migraine 09/10/2014  . Migraines    with aura  . Obesity 09/10/2014  . Palpitation   . STD (sexually transmitted disease)    HSV2    Past Surgical History:  Procedure Laterality Date  . CHOLECYSTECTOMY    . THERAPEUTIC ABORTION     elective    MEDS:   Current Outpatient Medications on File Prior to Visit  Medication Sig Dispense Refill  . pantoprazole (PROTONIX) 40 MG tablet      No current facility-administered medications on file prior to visit.     ALLERGIES: Norethindrone  Family History  Problem Relation Age of Onset  . Heart disease Mother        Pacemaker   . Diabetes Mother   . Hypertension Mother   . Stroke Maternal Grandmother   . Diabetes Maternal Grandmother   . Hypertension Maternal Grandmother   . Stroke Maternal Grandfather   . Heart disease Maternal Grandfather   . Hypertension Maternal Grandfather   . Hypertension Father   . Hypertension Maternal Aunt   . Diabetes type I Maternal Aunt   . Heart attack Maternal Aunt   . Heart disease Maternal Aunt     SH:  Married, non smoker  Review of Systems  All other systems reviewed and are negative.   PHYSICAL EXAMINATION:    BP 120/80   Pulse 84   Temp 98 F (36.7 C) (Temporal)   Ht 5\' 9"  (1.753 m)   Wt 207 lb (93.9 kg)   LMP 09/07/2018   BMI 30.57 kg/m     General appearance: alert, cooperative and appears stated age  Assessment: Irregular menstrual cycles that have significant  improved with OCPs for two months and change in diet  Plan: For now, will continue to stay off any hormonal method.   Does not desire contraception at this time Needs Pap--due after 09/13/2018.  Has appt scheduled.     ~15 minutes spent with patient >50% of time was in face to face discussion of above.

## 2018-09-21 ENCOUNTER — Ambulatory Visit: Payer: Managed Care, Other (non HMO) | Admitting: Obstetrics & Gynecology

## 2018-10-30 ENCOUNTER — Telehealth: Payer: Self-pay | Admitting: Obstetrics & Gynecology

## 2018-10-30 NOTE — Telephone Encounter (Signed)
When calling patient to remind her of 11/01/18 annual exam appointment, patient states she will not have childcare for that day and needs to reschedule. Rescheduled to next available appointment on 05/03/2019. Patient states she is too worried to wait that long since she had cancer cells previously and would like to know if there is any way we can get her in before then.

## 2018-10-31 NOTE — Telephone Encounter (Signed)
Call to patient. Patient in 08 recall for LGSIL pap from 2019. Patient's aex appointment rescheduled to Thursday 11-15-2018 at 1400. Patient agreeable to date and time of appointment.   Routing to provider and will close encounter.

## 2018-11-01 ENCOUNTER — Ambulatory Visit: Payer: Managed Care, Other (non HMO) | Admitting: Obstetrics & Gynecology

## 2018-11-13 ENCOUNTER — Other Ambulatory Visit: Payer: Self-pay

## 2018-11-15 ENCOUNTER — Other Ambulatory Visit: Payer: Self-pay

## 2018-11-15 ENCOUNTER — Telehealth: Payer: Self-pay | Admitting: *Deleted

## 2018-11-15 ENCOUNTER — Other Ambulatory Visit (HOSPITAL_COMMUNITY)
Admission: RE | Admit: 2018-11-15 | Discharge: 2018-11-15 | Disposition: A | Discharge status: Home / Self Care | Payer: Managed Care, Other (non HMO) | Source: Ambulatory Visit | Attending: Obstetrics & Gynecology | Admitting: Obstetrics & Gynecology

## 2018-11-15 ENCOUNTER — Ambulatory Visit (INDEPENDENT_AMBULATORY_CARE_PROVIDER_SITE_OTHER): Payer: Managed Care, Other (non HMO) | Admitting: Obstetrics & Gynecology

## 2018-11-15 ENCOUNTER — Encounter: Payer: Self-pay | Admitting: Obstetrics & Gynecology

## 2018-11-15 VITALS — BP 110/80 | HR 76 | Temp 97.7°F | Ht 68.0 in | Wt 204.0 lb

## 2018-11-15 DIAGNOSIS — D649 Anemia, unspecified: Secondary | ICD-10-CM

## 2018-11-15 DIAGNOSIS — R87612 Low grade squamous intraepithelial lesion on cytologic smear of cervix (LGSIL): Secondary | ICD-10-CM | POA: Diagnosis not present

## 2018-11-15 DIAGNOSIS — Z01419 Encounter for gynecological examination (general) (routine) without abnormal findings: Secondary | ICD-10-CM | POA: Diagnosis not present

## 2018-11-15 DIAGNOSIS — N6310 Unspecified lump in the right breast, unspecified quadrant: Secondary | ICD-10-CM

## 2018-11-15 NOTE — Patient Instructions (Signed)
Dr. Irene Limbo, MD [facility] Address: [address], [place], [place] [address] Phone: [phone]

## 2018-11-15 NOTE — Telephone Encounter (Signed)
Spoke with Linda Fuller at [facility]. Patient is scheduled for right breast US on 8/28 at 7:40am.   Patient notified of appt, agreeable to date and time.   Routing to provider for final review. Patient is agreeable to disposition. Will close encounter.

## 2018-11-15 NOTE — Progress Notes (Signed)
28 y.o. [id number] Married [place] or [place] American female here for annual exam. Cycles are fairly regular.  Flow is lasting about 4 days.  Not taking anything for contraception.  Would be happy with pregnancy but not being aggressive with this.    Has been considering plastic surgery.  Interested in having a butt lift and abdominoplasty.    Vaginitis issues have resolved.    Does have IBS and linzess has really helped.    Patient's last menstrual period was 10/26/2018.          Sexually active: Yes.    The current method of family planning is none.    Exercising: Yes.    walk, cardio Smoker:  no  Health Maintenance: Pap:  09/12/17 LSIL. Colpo: HPV changes  History of abnormal Pap:  yes  MMG: 01/30/15 Right and Left [place] BIRADS1:neg. F/u age 26 Colonoscopy:  10/28/13 Sigmoidoscopy normal  BMD:   never TDaP:  2013 Screening Labs: PCP   reports that she has never smoked. She has never used smokeless tobacco. She reports current alcohol use of about 3.0 standard drinks of alcohol per week. She reports previous drug use. Drug: Marijuana.  Past Medical History:  Diagnosis Date  . Anemia   . ANXIETY 01/27/2010  . Chronic constipation 01/22/2013  . CONSTIPATION 09/15/2009  . Depression   . GERD (gastroesophageal reflux disease)   . IBS (irritable bowel syndrome)   . Migraine 09/10/2014  . Migraines    with aura  . Obesity 09/10/2014  . Palpitation   . STD (sexually transmitted disease)    HSV2    Past Surgical History:  Procedure Laterality Date  . CHOLECYSTECTOMY    . THERAPEUTIC ABORTION     elective    Current Outpatient Medications  Medication Sig Dispense Refill  . LINZESS 145 MCG CAPS capsule Take 1 capsule by mouth daily.     No current facility-administered medications for this visit.     Family History  Problem Relation Age of Onset  . Heart disease Mother        Pacemaker   . Diabetes Mother   . Hypertension Mother   . Stroke Maternal Grandmother   . Diabetes  Maternal Grandmother   . Hypertension Maternal Grandmother   . Stroke Maternal Grandfather   . Heart disease Maternal Grandfather   . Hypertension Maternal Grandfather   . Hypertension Father   . Hypertension Maternal Aunt   . Diabetes type I Maternal Aunt   . Heart attack Maternal Aunt   . Heart disease Maternal Aunt     Review of Systems  Skin:       Ingrown hair   All other systems reviewed and are negative.   Exam:   BP 110/80   Pulse 76   Temp 97.7 F (36.5 C) (Temporal)   Ht 5\' 8"  (1.727 m)   Wt 204 lb (92.5 kg)   LMP 10/26/2018   BMI 31.02 kg/m  Height: 5\' 8"  (172.7 cm) Weight: -23# Ht Readings from Last 3 Encounters:  11/15/18 5\' 8"  (1.727 m)  09/07/18 5\' 9"  (1.753 m)  04/23/18 5\' 9"  (1.753 m)    General appearance: alert, cooperative and appears stated age Head: Normocephalic, without obvious abnormality, atraumatic Neck: no adenopathy, supple, symmetrical, trachea midline and thyroid normal to inspection and palpation Lungs: clear to auscultation bilaterally Breasts: 4cm right breast lesion, mobile but tender, c/w cyst; no nipple discharge or LAD; left breast without masses, skin changes, LAD Heart: regular rate and  rhythm Abdomen: soft, non-tender; bowel sounds normal; no masses,  no organomegaly Extremities: extremities normal, atraumatic, no cyanosis or edema Skin: Skin color, texture, turgor normal. No rashes or lesions Lymph nodes: Cervical, supraclavicular, and axillary nodes normal. No abnormal inguinal nodes palpated Neurologic: Grossly normal  Pelvic: External genitalia:  no lesions              Urethra:  normal appearing urethra with no masses, tenderness or lesions              Bartholins and Skenes: normal                 Vagina: normal appearing vagina with normal color and discharge, no lesions              Cervix: no lesions              Pap taken: Yes.   Bimanual Exam:  Uterus:  normal size, contour, position, consistency, mobility,  non-tender              Adnexa: normal adnexa and no mass, fullness, tenderness               Rectovaginal: Confirms               Anus:  normal sphincter tone, no lesions  Chaperone was present for exam.  A:  Well Woman with normal exam H/o IBS H/o menorrhagia that has removed H/o recurrent vaginitis that has significantly improved H/o LGSIL pap 2019 Intentional weight loss Mild anemia Right, tender, breast lesion about 4cm, c/w cyst  P:   Mammogram guidelines reviewed. pap smear with HR HPV obtained today  CBC will be obtained today Tdap UTD Breast ultrasound (rigth) will be scheduled Return annually or prn

## 2018-11-15 NOTE — Telephone Encounter (Signed)
-----   Message from Megan Salon, MD sent at 11/15/2018 10:33 AM EDT ----- Regarding: breast ultrasound Pt has a tender right breast lesion, about 4cm, that is either a large cyst or fibrocystic area.  She's lost about 25 pounds since I saw her last.  She needs a breast ultrasound scheduled at the breast center.  Thanks.

## 2018-11-16 LAB — CBC
Hematocrit: 36.2 % (ref 34.0–46.6)
Hemoglobin: 11.3 g/dL (ref 11.1–15.9)
MCH: 29.5 pg (ref 26.6–33.0)
MCHC: 31.2 g/dL — ABNORMAL LOW (ref 31.5–35.7)
MCV: 95 fL (ref 79–97)
Platelets: 297 10*3/uL (ref 150–450)
RBC: 3.83 x10E6/uL (ref 3.77–5.28)
RDW: 12.3 % (ref 11.7–15.4)
WBC: 5.2 10*3/uL (ref 3.4–10.8)

## 2018-11-19 LAB — CYTOLOGY - PAP
Diagnosis: NEGATIVE
HPV: NOT DETECTED

## 2018-11-23 ENCOUNTER — Other Ambulatory Visit: Payer: Self-pay

## 2018-11-23 ENCOUNTER — Ambulatory Visit
Admission: RE | Admit: 2018-11-23 | Discharge: 2018-11-23 | Disposition: A | Discharge status: Home / Self Care | Payer: Managed Care, Other (non HMO) | Source: Ambulatory Visit | Attending: Obstetrics & Gynecology | Admitting: Obstetrics & Gynecology

## 2018-11-23 DIAGNOSIS — N6310 Unspecified lump in the right breast, unspecified quadrant: Secondary | ICD-10-CM

## 2018-12-06 ENCOUNTER — Telehealth: Payer: Self-pay | Admitting: Obstetrics & Gynecology

## 2018-12-06 NOTE — Telephone Encounter (Signed)
Spoke with patient. Patient states she is scheduled for plastic surgery for a buttock lift 12/2018. Patient is requesting Dr. Sabra Heck complete surgical clearance to include a EKG. Advised patient she would need to see her PCP for surgical clearance. Recommended patient go ahead and contact her PCP. Will update Dr. Sabra Heck and return call if any additional recommendations. Patient agreeable.   Routing to provider for final review. Patient is agreeable to disposition. Will close encounter.

## 2018-12-06 NOTE — Telephone Encounter (Signed)
Patient left voicemail requesting lab work to be cleared for surgery.

## 2018-12-11 ENCOUNTER — Ambulatory Visit (INDEPENDENT_AMBULATORY_CARE_PROVIDER_SITE_OTHER): Payer: Managed Care, Other (non HMO) | Admitting: Internal Medicine

## 2018-12-11 ENCOUNTER — Other Ambulatory Visit (INDEPENDENT_AMBULATORY_CARE_PROVIDER_SITE_OTHER): Payer: Managed Care, Other (non HMO)

## 2018-12-11 ENCOUNTER — Telehealth: Payer: Self-pay | Admitting: Hematology

## 2018-12-11 ENCOUNTER — Encounter: Payer: Self-pay | Admitting: Internal Medicine

## 2018-12-11 ENCOUNTER — Other Ambulatory Visit: Payer: Self-pay

## 2018-12-11 VITALS — BP 130/80 | HR 81 | Temp 98.2°F | Ht 68.0 in | Wt 206.0 lb

## 2018-12-11 DIAGNOSIS — E538 Deficiency of other specified B group vitamins: Secondary | ICD-10-CM

## 2018-12-11 DIAGNOSIS — E559 Vitamin D deficiency, unspecified: Secondary | ICD-10-CM

## 2018-12-11 DIAGNOSIS — Z01818 Encounter for other preprocedural examination: Secondary | ICD-10-CM

## 2018-12-11 DIAGNOSIS — Z0000 Encounter for general adult medical examination without abnormal findings: Secondary | ICD-10-CM

## 2018-12-11 DIAGNOSIS — F419 Anxiety disorder, unspecified: Secondary | ICD-10-CM | POA: Diagnosis not present

## 2018-12-11 DIAGNOSIS — F32A Depression, unspecified: Secondary | ICD-10-CM

## 2018-12-11 DIAGNOSIS — E611 Iron deficiency: Secondary | ICD-10-CM

## 2018-12-11 DIAGNOSIS — Z114 Encounter for screening for human immunodeficiency virus [HIV]: Secondary | ICD-10-CM

## 2018-12-11 DIAGNOSIS — F329 Major depressive disorder, single episode, unspecified: Secondary | ICD-10-CM | POA: Diagnosis not present

## 2018-12-11 DIAGNOSIS — Z0001 Encounter for general adult medical examination with abnormal findings: Secondary | ICD-10-CM | POA: Diagnosis not present

## 2018-12-11 DIAGNOSIS — D649 Anemia, unspecified: Secondary | ICD-10-CM | POA: Diagnosis not present

## 2018-12-11 LAB — URINALYSIS, ROUTINE W REFLEX MICROSCOPIC
Bilirubin Urine: NEGATIVE
Ketones, ur: NEGATIVE
Leukocytes,Ua: NEGATIVE
Nitrite: NEGATIVE
Specific Gravity, Urine: >=1.03 — AB (ref 1.000–1.030)
Total Protein, Urine: NEGATIVE
Urine Glucose: NEGATIVE
Urobilinogen, UA: 0.2 (ref 0.0–1.0)
pH: 6 (ref 5.0–8.0)

## 2018-12-11 LAB — CBC WITH DIFFERENTIAL/PLATELET
Basophils Absolute: 0.1 10*3/uL (ref 0.0–0.1)
Basophils Relative: 1.2 % (ref 0.0–3.0)
Eosinophils Absolute: 0.1 10*3/uL (ref 0.0–0.7)
Eosinophils Relative: 2 % (ref 0.0–5.0)
HCT: 35.1 % — ABNORMAL LOW (ref 36.0–46.0)
Hemoglobin: 11.5 g/dL — ABNORMAL LOW (ref 12.0–15.0)
Lymphocytes Relative: 32.3 % (ref 12.0–46.0)
Lymphs Abs: 1.8 10*3/uL (ref 0.7–4.0)
MCHC: 32.7 g/dL (ref 30.0–36.0)
MCV: 92.4 fl (ref 78.0–100.0)
Monocytes Absolute: 0.4 10*3/uL (ref 0.1–1.0)
Monocytes Relative: 8 % (ref 3.0–12.0)
Neutro Abs: 3.2 10*3/uL (ref 1.4–7.7)
Neutrophils Relative %: 56.5 % (ref 43.0–77.0)
Platelets: 273 10*3/uL (ref 150.0–400.0)
RBC: 3.8 Mil/uL — ABNORMAL LOW (ref 3.87–5.11)
RDW: 13.4 % (ref 11.5–15.5)
WBC: 5.6 10*3/uL (ref 4.0–10.5)

## 2018-12-11 LAB — IBC PANEL
Iron: 99 ug/dL (ref 42–145)
Saturation Ratios: 21 % (ref 20.0–50.0)
Transferrin: 337 mg/dL (ref 212.0–360.0)

## 2018-12-11 LAB — LIPID PANEL
Cholesterol: 186 mg/dL (ref 0–200)
HDL: 52.4 mg/dL (ref >39.00)
LDL Cholesterol: 117 mg/dL — ABNORMAL HIGH (ref 0–99)
NonHDL: 133.95
Total CHOL/HDL Ratio: 4
Triglycerides: 86 mg/dL (ref 0.0–149.0)
VLDL: 17.2 mg/dL (ref 0.0–40.0)

## 2018-12-11 LAB — HEPATIC FUNCTION PANEL
ALT: 11 U/L (ref 0–35)
AST: 12 U/L (ref 0–37)
Albumin: 4.3 g/dL (ref 3.5–5.2)
Alkaline Phosphatase: 40 U/L (ref 39–117)
Bilirubin, Direct: 0 mg/dL (ref 0.0–0.3)
Total Bilirubin: 0.3 mg/dL (ref 0.2–1.2)
Total Protein: 7.7 g/dL (ref 6.0–8.3)

## 2018-12-11 LAB — BASIC METABOLIC PANEL
BUN: 13 mg/dL (ref 6–23)
CO2: 25 mEq/L (ref 19–32)
Calcium: 9.7 mg/dL (ref 8.4–10.5)
Chloride: 104 mEq/L (ref 96–112)
Creatinine, Ser: 0.8 mg/dL (ref 0.40–1.20)
GFR: 102.24 mL/min (ref >60.00)
Glucose, Bld: 96 mg/dL (ref 70–99)
Potassium: 3.7 mEq/L (ref 3.5–5.1)
Sodium: 138 mEq/L (ref 135–145)

## 2018-12-11 LAB — FERRITIN: Ferritin: 11.2 ng/mL (ref 10.0–291.0)

## 2018-12-11 LAB — HCG, QUANTITATIVE, PREGNANCY: Quantitative HCG: <0.6 m[IU]/mL

## 2018-12-11 LAB — VITAMIN B12: Vitamin B-12: 546 pg/mL (ref 211–911)

## 2018-12-11 LAB — PROTIME-INR
INR: 1.1 ratio — ABNORMAL HIGH (ref 0.8–1.0)
Prothrombin Time: 12.3 s (ref 9.6–13.1)

## 2018-12-11 LAB — VITAMIN D 25 HYDROXY (VIT D DEFICIENCY, FRACTURES): VITD: 11.58 ng/mL — ABNORMAL LOW (ref 30.00–100.00)

## 2018-12-11 LAB — APTT: aPTT: 30.1 s (ref 23.4–32.7)

## 2018-12-11 LAB — TSH: TSH: 1.23 u[IU]/mL (ref 0.35–4.50)

## 2018-12-11 NOTE — Telephone Encounter (Signed)
I received a call from Linda Fuller who needs to schedule a hem appt for anemia and surgical clearance. She has been scheduled to see Dr. Irene Limbo on 9/23 at 11am. Pt aware to arrive 15 minutes early.

## 2018-12-11 NOTE — Progress Notes (Signed)
Subjective:    Patient ID: Linda Fuller, female    DOB: 04-16-1990, 28 y.o.   MRN: [id number]  HPI  Here for wellness and f/u;  Overall doing ok;  Pt denies Chest pain, worsening SOB, DOE, wheezing, orthopnea, PND, worsening LE edema, palpitations, dizziness or syncope.  Pt denies neurological change such as new headache, facial or extremity weakness.  Pt denies polydipsia, polyuria, or low sugar symptoms. Pt states overall good compliance with treatment and medications, good tolerability, and has been trying to follow appropriate diet.  Pt denies worsening depressive symptoms, suicidal ideation or panic. No fever, night sweats, wt loss, loss of appetite, or other constitutional symptoms.  Pt states good ability with ADL's, has low fall risk, home safety reviewed and adequate, no other significant changes in hearing or vision, and only occasionally active with exercise.   BP Readings from Last 3 Encounters:  12/11/18 130/80  11/15/18 110/80  09/07/18 120/80   Wt Readings from Last 3 Encounters:  12/11/18 206 lb (93.4 kg)  11/15/18 204 lb (92.5 kg)  09/07/18 207 lb (93.9 kg)  Pt also here for preop with form per plastic surgury for info needed, including medical clearance letter, ECG, CMp, PT, PTT, cbc, ua, HIV and quant Bhcg.  The information provided also includes the need for hematology clearance I suspect related to her hx of anemia, for which she is very surprised as I point this out and upset Past Medical History:  Diagnosis Date  . Anemia   . ANXIETY 01/27/2010  . Chronic constipation 01/22/2013  . CONSTIPATION 09/15/2009  . Depression   . GERD (gastroesophageal reflux disease)   . IBS (irritable bowel syndrome)   . Migraine 09/10/2014  . Migraines    with aura  . Obesity 09/10/2014  . Palpitation   . STD (sexually transmitted disease)    HSV2   Past Surgical History:  Procedure Laterality Date  . CHOLECYSTECTOMY    . THERAPEUTIC ABORTION     elective    reports that she  has never smoked. She has never used smokeless tobacco. She reports current alcohol use of about 3.0 standard drinks of alcohol per week. She reports previous drug use. Drug: Marijuana. family history includes Diabetes in her maternal grandmother and mother; Diabetes type I in her maternal aunt; Heart attack in her maternal aunt; Heart disease in her maternal aunt, maternal grandfather, and mother; Hypertension in her father, maternal aunt, maternal grandfather, maternal grandmother, and mother; Stroke in her maternal grandfather and maternal grandmother. Allergies  Allergen Reactions  . Norethindrone Rash   Current Outpatient Medications on File Prior to Visit  Medication Sig Dispense Refill  . LINZESS 145 MCG CAPS capsule Take 1 capsule by mouth daily.     No current facility-administered medications on file prior to visit.    ROS: Constitutional: Negative for other unusual diaphoresis, sweats, appetite or weight changes HENT: Negative for other worsening hearing loss, ear pain, facial swelling, mouth sores or neck stiffness.   Eyes: Negative for other worsening pain, redness or other visual disturbance.  Respiratory: Negative for other stridor or swelling Cardiovascular: Negative for other palpitations or other chest pain  Gastrointestinal: Negative for worsening diarrhea or loose stools, blood in stool, distention or other pain Genitourinary: Negative for hematuria, flank pain or other change in urine volume.  Musculoskeletal: Negative for myalgias or other joint swelling.  Skin: Negative for other color change, or other wound or worsening drainage.  Neurological: Negative for other syncope or  numbness. Hematological: Negative for other adenopathy or swelling Psychiatric/Behavioral: Negative for hallucinations, other worsening agitation, SI, self-injury, or new decreased concentration All otherwise neg per pt    Objective:   Physical Exam BP 130/80 (BP Location: Left Arm, Patient  Position: Sitting, Cuff Size: Large)   Pulse 81   Temp 98.2 F (36.8 C) (Oral)   Ht 5\' 8"  (1.727 m)   Wt 206 lb (93.4 kg)   SpO2 99%   BMI 31.32 kg/m  VS noted,  Constitutional: Pt is oriented to person, place, and time. Appears well-developed and well-nourished, in no significant distress and comfortable Head: Normocephalic and atraumatic  Eyes: Conjunctivae and EOM are normal. Pupils are equal, round, and reactive to light Right Ear: External ear normal without discharge Left Ear: External ear normal without discharge Nose: Nose without discharge or deformity Mouth/Throat: Oropharynx is without other ulcerations and moist  Neck: Normal range of motion. Neck supple. No JVD present. No tracheal deviation present or significant neck LA or mass Cardiovascular: Normal rate, regular rhythm, normal heart sounds and intact distal pulses.   Pulmonary/Chest: WOB normal and breath sounds without rales or wheezing  Abdominal: Soft. Bowel sounds are normal. NT. No HSM  Musculoskeletal: Normal range of motion. Exhibits no edema Lymphadenopathy: Has no other cervical adenopathy.  Neurological: Pt is alert and oriented to person, place, and time. Pt has normal reflexes. No cranial nerve deficit. Motor grossly intact, Gait intact Skin: Skin is warm and dry. No rash noted or new ulcerations Psychiatric:  Has irritable mood and affect. Behavior is normal without agitation All otherwise neg per pt    Lab Results  Component Value Date   WBC 5.2 11/15/2018   HGB 11.3 11/15/2018   HCT 36.2 11/15/2018   PLT 297 11/15/2018   GLUCOSE 95 02/20/2018   ALT 16 02/20/2018   AST 17 02/20/2018   NA 137 02/20/2018   K 3.5 02/20/2018   CL 109 02/20/2018   CREATININE 0.68 02/20/2018   BUN 7 02/20/2018   CO2 23 02/20/2018   TSH 1.370 04/23/2018   ECG I personally interpreted today: NSR     Assessment & Plan:

## 2018-12-11 NOTE — Patient Instructions (Addendum)
Your EKG was OK today  Please continue all other medications as before, and refills have been done if requested.  Please have the pharmacy call with any other refills you may need.  Please continue your efforts at being more active, low cholesterol diet, and weight control.  You are otherwise up to date with prevention measures today.  Please keep your appointments with your specialists as you may have planned  You will be contacted regarding the referral for: Hematology  Your form will be done soon after the lab work is done.  Linda Fuller with your Surgury!

## 2018-12-12 ENCOUNTER — Telehealth: Payer: Self-pay

## 2018-12-12 ENCOUNTER — Other Ambulatory Visit: Payer: Self-pay | Admitting: Internal Medicine

## 2018-12-12 LAB — HIV ANTIBODY (ROUTINE TESTING W REFLEX): HIV 1&2 Ab, 4th Generation: NONREACTIVE

## 2018-12-12 MED ORDER — VITAMIN D (ERGOCALCIFEROL) 1.25 MG (50000 UNIT) PO CAPS: 50000.0000 [IU] | ORAL_CAPSULE | ORAL | 0 refills | Status: DC

## 2018-12-12 NOTE — Telephone Encounter (Signed)
-----   Message from Biagio Borg, MD sent at 12/12/2018 12:59 PM EDT ----- Left message on MyChart, pt to cont same tx except  The test results show that your current treatment is OK, as the tests are stable, except the LDL cholesterol is mildly high, and the Vitamin D level is low.  There is no evidence for iron deficiency.  Please take Vitamin D 50000 units weekly for 12 weeks, then plan to change to OTC Vitamin D3 at 2000 units per day, indefinitely.    Shirron to please inform pt, I will do rx

## 2018-12-12 NOTE — Telephone Encounter (Signed)
Pt has viewed results via MyChart  

## 2018-12-16 ENCOUNTER — Encounter: Payer: Self-pay | Admitting: Internal Medicine

## 2018-12-16 NOTE — Assessment & Plan Note (Signed)

## 2018-12-16 NOTE — Assessment & Plan Note (Signed)
ecg reviewed, now Letter and labs to be done as reqeusted

## 2018-12-16 NOTE — Assessment & Plan Note (Signed)
Also for heme referral as apparently required

## 2018-12-16 NOTE — Assessment & Plan Note (Signed)
stable overall by history and exam, recent data reviewed with pt, and pt to continue medical treatment as before,  to f/u any worsening symptoms or concerns  

## 2018-12-17 NOTE — Telephone Encounter (Signed)
Pt saw Dr Jenny Reichmann on 9/15 and had labs done. Pt would like to be contact about the status of the surgical clearance form.

## 2018-12-17 NOTE — Telephone Encounter (Addendum)
Pt has been informed.

## 2018-12-17 NOTE — Telephone Encounter (Signed)
Patient had labs mentioned below on 12/11/2018 and would like to know if surgical clearance forms were faxed, please advise patient directly.

## 2018-12-17 NOTE — Telephone Encounter (Signed)
Most likely to be done today

## 2018-12-18 ENCOUNTER — Telehealth: Payer: Self-pay

## 2018-12-18 DIAGNOSIS — Z01818 Encounter for other preprocedural examination: Secondary | ICD-10-CM

## 2018-12-18 NOTE — Telephone Encounter (Signed)
Pt stated that a copy of the EKG is needed to be faxed. She would also like to know if PCP can write on the clearance form that:   "Iron levels are ok. No referral to Hematology is needed".   Dr. Jenny Reichmann I have placed surgical clearance form back on your desk if you see fit to proceed.    Copied from [place] [phone]. Topic: Quick Sport and exercise psychologist Patient (Clinic Use ONLY) >> Dec 18, 2018 11:35 AM Pauline Good wrote: Reason for CRM: pt stated she is returning call to nurse, please call pt >> Dec 18, 2018  1:38 PM Keene Breath wrote: Patient is still waiting for a call from the nurse.  She needs her clearance for surgery to  Be completed and there are still some questions to be answered.  Please contact patient as soon as possible.  CB# [phone]

## 2018-12-18 NOTE — Telephone Encounter (Signed)
Clearance form and OV notes has been faxed.

## 2018-12-18 NOTE — Telephone Encounter (Signed)
Ok this is done 

## 2018-12-19 ENCOUNTER — Inpatient Hospital Stay: Payer: Managed Care, Other (non HMO) | Attending: Hematology | Admitting: Hematology

## 2018-12-19 ENCOUNTER — Telehealth: Payer: Self-pay | Admitting: Hematology

## 2018-12-19 ENCOUNTER — Other Ambulatory Visit (INDEPENDENT_AMBULATORY_CARE_PROVIDER_SITE_OTHER): Payer: Managed Care, Other (non HMO)

## 2018-12-19 DIAGNOSIS — Z01818 Encounter for other preprocedural examination: Secondary | ICD-10-CM

## 2018-12-19 LAB — COMPREHENSIVE METABOLIC PANEL
ALT: 10 U/L (ref 0–35)
AST: 13 U/L (ref 0–37)
Albumin: 4.2 g/dL (ref 3.5–5.2)
Alkaline Phosphatase: 41 U/L (ref 39–117)
BUN: 9 mg/dL (ref 6–23)
CO2: 26 mEq/L (ref 19–32)
Calcium: 9.5 mg/dL (ref 8.4–10.5)
Chloride: 103 mEq/L (ref 96–112)
Creatinine, Ser: 0.7 mg/dL (ref 0.40–1.20)
GFR: 119.26 mL/min (ref >60.00)
Glucose, Bld: 88 mg/dL (ref 70–99)
Potassium: 3.5 mEq/L (ref 3.5–5.1)
Sodium: 138 mEq/L (ref 135–145)
Total Bilirubin: 0.2 mg/dL (ref 0.2–1.2)
Total Protein: 7.4 g/dL (ref 6.0–8.3)

## 2018-12-19 NOTE — Telephone Encounter (Signed)
Returned patient's phone call regarding rescheduling 09/23 appointment, left a voicemail.

## 2018-12-19 NOTE — Telephone Encounter (Signed)
Pt has been informed.

## 2018-12-19 NOTE — Addendum Note (Signed)
Addended by: Juliet Rude on: 12/19/2018 11:49 AM   Modules accepted: Orders

## 2018-12-24 ENCOUNTER — Telehealth: Payer: Self-pay

## 2018-12-24 NOTE — Telephone Encounter (Signed)
Informed pt that all forms/labs were faxed last week   Copied from [place] [phone]. Topic: General - Other >> Dec 24, 2018 10:12 AM Rainey Pines A wrote: Patient requesting a callback from [place] in regards to comprehensive labs and clearance form that was to be filled out and faxed

## 2019-02-25 ENCOUNTER — Telehealth: Payer: Self-pay | Admitting: Obstetrics & Gynecology

## 2019-02-25 NOTE — Telephone Encounter (Signed)
Patient is having symptoms of vaginitis and would like to speak with nurse about getting a prescription called in because she does not want to come in for appointment. She is aware that she may have to be seen.

## 2019-02-25 NOTE — Telephone Encounter (Signed)
Spoke with patient. Patient is s/p butt lift 12/2018. Patient has been using dial soap and wearing tight thigh high sticking that she has been instructed not to remove, has to void with them on and has to wipe from back to front. Patient reports vaginal irritation for the last 5wks, odor and light brown vaginal d/c. Denies itching. Patient declines OV, states she is not comfortable coming into provider office at this time. Patient is requesting RX for tx of BV.   Per review of Epic, last NuSwab on 04/23/18, negative. Advised patient OV needed for further evaluation, patient again declined. Patient request that Dr. Sabra Heck review and advise on Rx or OTC recommendations. Advised I will forward to Dr. Sabra Heck to review, she is still seeing patients, response may not be immediate. Patient agreeable.   Dr. Sabra Heck -please advise.

## 2019-02-26 MED ORDER — METRONIDAZOLE 500 MG PO TABS: 500.0000 mg | ORAL_TABLET | Freq: Two times a day (BID) | ORAL | 0 refills | Status: DC

## 2019-02-26 NOTE — Telephone Encounter (Signed)
Please let her know I am making an exception this time and it is ok to treat with flagyl 500mg  bid x 7 days.  If symptoms do not resolve, she needs to be seen.  No ETOH with medication.

## 2019-02-26 NOTE — Telephone Encounter (Signed)
Spoke with patient, advised per Dr. Sabra Heck. Patient verbalizes understanding and is agreeable. Rx to verified pharmacy. ETOH precautions reviewed.   Encounter closed.

## 2019-03-14 ENCOUNTER — Other Ambulatory Visit: Payer: Self-pay | Admitting: Internal Medicine

## 2019-05-03 ENCOUNTER — Ambulatory Visit: Payer: Managed Care, Other (non HMO) | Admitting: Obstetrics & Gynecology

## 2020-01-20 NOTE — Progress Notes (Deleted)
29 y.o. [id number] Married [place] or [place] American female here for annual exam.    No LMP recorded.          Sexually active: {yes no:314532}  The current method of family planning is {contraception:315051}.    Exercising: {yes no:314532}  {types:19826} Smoker:  {YES NO:22349}  Health Maintenance: Pap:  09-12-17 LGSIL,11-15-2018 neg HPV HR neg History of abnormal Pap:  yes MMG:  11-23-2018 right u/s birads 2:neg Colonoscopy:  10-28-13 sigmoidoscopy normal BMD:   none TDaP:  2013 Pneumonia vaccine(s):  no Shingrix:   no Hep C testing: *** Screening Labs: ***   reports that she has never smoked. She has never used smokeless tobacco. She reports current alcohol use of about 3.0 standard drinks of alcohol per week. She reports previous drug use. Drug: Marijuana.  Past Medical History:  Diagnosis Date  . Anemia   . ANXIETY 01/27/2010  . Chronic constipation 01/22/2013  . CONSTIPATION 09/15/2009  . Depression   . GERD (gastroesophageal reflux disease)   . IBS (irritable bowel syndrome)   . Migraine 09/10/2014  . Migraines    with aura  . Obesity 09/10/2014  . Palpitation   . STD (sexually transmitted disease)    HSV2    Past Surgical History:  Procedure Laterality Date  . CHOLECYSTECTOMY    . THERAPEUTIC ABORTION     elective    Current Outpatient Medications  Medication Sig Dispense Refill  . LINZESS 145 MCG CAPS capsule Take 1 capsule by mouth daily.    . metroNIDAZOLE (FLAGYL) 500 MG tablet Take 1 tablet (500 mg total) by mouth 2 (two) times daily. 14 tablet 0  . Vitamin D, Ergocalciferol, (DRISDOL) 1.25 MG (50000 UT) CAPS capsule Take 1 capsule (50,000 Units total) by mouth every 7 (seven) days. 12 capsule 0   No current facility-administered medications for this visit.    Family History  Problem Relation Age of Onset  . Heart disease Mother        Pacemaker   . Diabetes Mother   . Hypertension Mother   . Stroke Maternal Grandmother   . Diabetes Maternal Grandmother    . Hypertension Maternal Grandmother   . Stroke Maternal Grandfather   . Heart disease Maternal Grandfather   . Hypertension Maternal Grandfather   . Hypertension Father   . Hypertension Maternal Aunt   . Diabetes type I Maternal Aunt   . Heart attack Maternal Aunt   . Heart disease Maternal Aunt     Review of Systems  Exam:   There were no vitals taken for this visit.     General appearance: alert, cooperative and appears stated age Head: Normocephalic, without obvious abnormality, atraumatic Neck: no adenopathy, supple, symmetrical, trachea midline and thyroid {EXAM; THYROID:18604} Lungs: clear to auscultation bilaterally Breasts: {Exam; breast:13139::"normal appearance, no masses or tenderness"} Heart: regular rate and rhythm Abdomen: soft, non-tender; bowel sounds normal; no masses,  no organomegaly Extremities: extremities normal, atraumatic, no cyanosis or edema Skin: Skin color, texture, turgor normal. No rashes or lesions Lymph nodes: Cervical, supraclavicular, and axillary nodes normal. No abnormal inguinal nodes palpated Neurologic: Grossly normal   Pelvic: External genitalia:  no lesions              Urethra:  normal appearing urethra with no masses, tenderness or lesions              Bartholins and Skenes: normal  Vagina: normal appearing vagina with normal color and discharge, no lesions              Cervix: {exam; cervix:14595}              Pap taken: {yes no:314532} Bimanual Exam:  Uterus:  {exam; uterus:12215}              Adnexa: {exam; adnexa:12223}               Rectovaginal: Confirms               Anus:  normal sphincter tone, no lesions  Chaperone, ***Terence Lux, CMA, was present for exam.  A:  Well Woman with normal exam  P:   {plan; gyn:5269::"mammogram","pap smear","return annually or prn"}

## 2020-01-23 ENCOUNTER — Ambulatory Visit: Payer: Managed Care, Other (non HMO) | Admitting: Obstetrics & Gynecology

## 2020-02-10 ENCOUNTER — Ambulatory Visit: Payer: Managed Care, Other (non HMO) | Admitting: Obstetrics & Gynecology

## 2020-04-02 ENCOUNTER — Encounter (HOSPITAL_COMMUNITY): Payer: Self-pay

## 2020-04-02 ENCOUNTER — Ambulatory Visit (INDEPENDENT_AMBULATORY_CARE_PROVIDER_SITE_OTHER): Payer: Managed Care, Other (non HMO)

## 2020-04-02 ENCOUNTER — Other Ambulatory Visit: Payer: Self-pay

## 2020-04-02 ENCOUNTER — Ambulatory Visit (HOSPITAL_COMMUNITY)
Admission: EM | Admit: 2020-04-02 | Discharge: 2020-04-02 | Disposition: A | Discharge status: Home / Self Care | Payer: Managed Care, Other (non HMO) | Attending: Family Medicine | Admitting: Family Medicine

## 2020-04-02 DIAGNOSIS — R070 Pain in throat: Secondary | ICD-10-CM

## 2020-04-02 DIAGNOSIS — J0100 Acute maxillary sinusitis, unspecified: Secondary | ICD-10-CM | POA: Insufficient documentation

## 2020-04-02 DIAGNOSIS — U071 COVID-19: Secondary | ICD-10-CM | POA: Diagnosis not present

## 2020-04-02 DIAGNOSIS — H9203 Otalgia, bilateral: Secondary | ICD-10-CM | POA: Insufficient documentation

## 2020-04-02 MED ORDER — AMOXICILLIN 875 MG PO TABS: 875.0000 mg | ORAL_TABLET | Freq: Two times a day (BID) | ORAL | 0 refills | Status: AC

## 2020-04-02 NOTE — ED Triage Notes (Signed)
Pt is here with a cough, sore throat that started 10 days ago, pt has taken OTC meds to relieve discomfort.

## 2020-04-02 NOTE — ED Provider Notes (Addendum)
[facility] CARE CENTER   [id number] 04/02/20 Arrival Time: 4010  ASSESSMENT & PLAN:  1. Otalgia of both ears   2. Acute non-recurrent maxillary sinusitis     I have personally viewed the imaging studies ordered this visit. No acute changes on CXR.   COVID-19 testing sent. See letter/work note on file for self-isolation guidelines. OTC symptom care as needed.  Given duration and symptoms will begin: Meds ordered this encounter  Medications  . amoxicillin (AMOXIL) 875 MG tablet    Sig: Take 1 tablet (875 mg total) by mouth 2 (two) times daily for 10 days.    Dispense:  20 tablet    Refill:  0     Reviewed expectations re: course of current medical issues. Questions answered. Outlined signs and symptoms indicating need for more acute intervention. Understanding verbalized. After Visit Summary given.   SUBJECTIVE: History from: patient. Linda Fuller is a 30 y.o. female who reports URI symptoms; approx 1.5 weeks; continued coughing; ques occas SOB; no CP. Maxillary facial pain and bilat ear pressure over few days. OTC without relief. Denies: fever. Normal PO intake without n/v/d.    OBJECTIVE:  Vitals:   04/02/20 0942  BP: 132/84  Pulse: 96  Resp: 18  Temp: 98.2 F (36.8 C)  TempSrc: Oral  SpO2: 100%    General appearance: alert; no distress Eyes: PERRLA; EOMI; conjunctiva normal HENT: Roff; AT; with mild nasal congestion; maxillary sinus TTP Neck: supple  Lungs: speaks full sentences without difficulty; unlabored Extremities: no edema Skin: warm and dry Neurologic: normal gait Psychological: alert and cooperative; normal mood and affect  Labs:  Labs Reviewed  SARS CORONAVIRUS 2 (TAT 6-24 HRS)    Imaging: DG Chest 2 View  Result Date: 04/02/2020 CLINICAL DATA:  Sore throat. EXAM: CHEST - 2 VIEW COMPARISON:  09/11/2017 FINDINGS: Normal heart size and mediastinal contours. No acute infiltrate or edema. No effusion or pneumothorax. No acute osseous  findings. IMPRESSION: Negative chest. Electronically Signed   By: Marnee Spring M.D.   On: 04/02/2020 10:21    Allergies  Allergen Reactions  . Norethindrone Rash    Past Medical History:  Diagnosis Date  . Anemia   . ANXIETY 01/27/2010  . Chronic constipation 01/22/2013  . CONSTIPATION 09/15/2009  . Depression   . GERD (gastroesophageal reflux disease)   . IBS (irritable bowel syndrome)   . Migraine 09/10/2014  . Migraines    with aura  . Obesity 09/10/2014  . Palpitation   . STD (sexually transmitted disease)    HSV2   Social History   Socioeconomic History  . Marital status: Married    Spouse name: Not on file  . Number of children: 0  . Years of education: Not on file  . Highest education level: Not on file  Occupational History  . Occupation: Civil engineer, contracting: STUDENT    Employer: OTHER    Comment: Agricultural consultant  Tobacco Use  . Smoking status: Never Smoker  . Smokeless tobacco: Never Used  Vaping Use  . Vaping Use: Never used  Substance and Sexual Activity  . Alcohol use: Yes  . Drug use: Yes    Types: Marijuana  . Sexual activity: Yes    Partners: Male    Birth control/protection: Coitus interruptus, None  Other Topics Concern  . 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 Heart disease Mother        Pacemaker   . Diabetes Mother   . Hypertension Mother   . Stroke Maternal Grandmother   . Diabetes Maternal Grandmother   . Hypertension Maternal Grandmother   . Stroke Maternal Grandfather   . Heart disease Maternal  Grandfather   . Hypertension Maternal Grandfather   . Hypertension Father   . Hypertension Maternal Aunt   . Diabetes type I Maternal Aunt   . Heart attack Maternal Aunt   . Heart disease Maternal Aunt    Past Surgical History:  Procedure Laterality Date  . CHOLECYSTECTOMY    . THERAPEUTIC ABORTION     elective     Mardella Layman, MD 04/02/20 1617    Mardella Layman, MD 04/02/20 [phone]

## 2020-04-02 NOTE — Discharge Instructions (Addendum)
[place] [id number] [phone]   04/02/20   To Whom It May Concern:   Linda Fuller  DOB: [birth year]   The above individual has been tested for COVID-19 today. Test results are usually received within 24 hours from the time of the test.  Based on Centers for Disease Control and Prevention (CDC) guidelines, he or she is required to stay home and self-isolate until the test results are returned.   1. If results are positive, he or she will need to stay at home and self-isolate until the following conditions are met: at least 10 days since symptoms onset or date of positive test results AND 3 days fever free without antipyretics (Tylenol or Ibuprofen) AND improvement in respiratory symptoms.  2. If results are negative, he or she may resume normal activities if no symptoms are present or once feeling better.   Sincerely,      Vanessa Kick MD

## 2020-04-03 LAB — SARS CORONAVIRUS 2 (TAT 6-24 HRS): SARS Coronavirus 2: POSITIVE — AB

## 2020-08-01 ENCOUNTER — Emergency Department (HOSPITAL_COMMUNITY)
Admission: EM | Admit: 2020-08-01 | Discharge: 2020-08-01 | Disposition: A | Discharge status: Home / Self Care | Payer: Managed Care, Other (non HMO) | Attending: Emergency Medicine | Admitting: Emergency Medicine

## 2020-08-01 ENCOUNTER — Emergency Department (HOSPITAL_COMMUNITY): Payer: Managed Care, Other (non HMO)

## 2020-08-01 ENCOUNTER — Encounter (HOSPITAL_COMMUNITY): Payer: Self-pay | Admitting: Emergency Medicine

## 2020-08-01 DIAGNOSIS — R079 Chest pain, unspecified: Secondary | ICD-10-CM | POA: Diagnosis present

## 2020-08-01 DIAGNOSIS — K219 Gastro-esophageal reflux disease without esophagitis: Secondary | ICD-10-CM | POA: Diagnosis not present

## 2020-08-01 LAB — BASIC METABOLIC PANEL
Anion gap: 6 (ref 5–15)
BUN: 9 mg/dL (ref 6–20)
CO2: 26 mmol/L (ref 22–32)
Calcium: 9.5 mg/dL (ref 8.9–10.3)
Chloride: 106 mmol/L (ref 98–111)
Creatinine, Ser: 0.8 mg/dL (ref 0.44–1.00)
GFR, Estimated: >60 mL/min (ref >60)
Glucose, Bld: 82 mg/dL (ref 70–99)
Potassium: 3.5 mmol/L (ref 3.5–5.1)
Sodium: 138 mmol/L (ref 135–145)

## 2020-08-01 LAB — CBC
HCT: 32.3 % — ABNORMAL LOW (ref 36.0–46.0)
Hemoglobin: 10.2 g/dL — ABNORMAL LOW (ref 12.0–15.0)
MCH: 28.7 pg (ref 26.0–34.0)
MCHC: 31.6 g/dL (ref 30.0–36.0)
MCV: 90.7 fL (ref 80.0–100.0)
Platelets: 324 10*3/uL (ref 150–400)
RBC: 3.56 MIL/uL — ABNORMAL LOW (ref 3.87–5.11)
RDW: 13.6 % (ref 11.5–15.5)
WBC: 4.2 10*3/uL (ref 4.0–10.5)
nRBC: 0 % (ref 0.0–0.2)

## 2020-08-01 LAB — I-STAT BETA HCG BLOOD, ED (MC, WL, AP ONLY): I-stat hCG, quantitative: <5 m[IU]/mL (ref <5)

## 2020-08-01 LAB — TROPONIN I (HIGH SENSITIVITY): Troponin I (High Sensitivity): <2 ng/L (ref <18)

## 2020-08-01 MED ORDER — LIDOCAINE VISCOUS HCL 2 % MT SOLN
15.0000 mL | Freq: Once | OROMUCOSAL | Status: AC
  Administered 2020-08-01: 15 mL via ORAL
  Filled 2020-08-01: qty 15

## 2020-08-01 MED ORDER — SUCRALFATE 1 G PO TABS: 1.0000 g | ORAL_TABLET | Freq: Three times a day (TID) | ORAL | 0 refills | Status: DC

## 2020-08-01 MED ORDER — ALUM & MAG HYDROXIDE-SIMETH 200-200-20 MG/5ML PO SUSP
30.0000 mL | Freq: Once | ORAL | Status: AC
  Administered 2020-08-01: 30 mL via ORAL
  Filled 2020-08-01: qty 30

## 2020-08-01 NOTE — ED Notes (Signed)
Patient transported to X-ray 

## 2020-08-01 NOTE — ED Provider Notes (Signed)
[facility]   CSN: [id number] Arrival date & time: 08/01/20  1521     History Chief Complaint  Patient presents with  . Chest Pain    Linda Fuller is a 30 y.o. female.  HPI A 30 year old patient presents for evaluation of chest pain. Initial onset of pain was approximately 1-3 hours ago. The patient's chest pain is not worse with exertion. The patient's chest pain is middle- or left-sided, is not well-localized, is not described as heaviness/pressure/tightness, is not sharp and does not radiate to the arms/jaw/neck. The patient does not complain of nausea and denies diaphoresis. The patient has no history of stroke, has no history of peripheral artery disease, has not smoked in the past 90 days, denies any history of treated diabetes, has no relevant family history of coronary artery disease (first degree relative at less than age 31), is not hypertensive, has no history of hypercholesterolemia and does not have an elevated BMI (>=30).   Pain started 1 hour ago.  Because of the middle of her chest to her left clavicle, does not go down her arm.  Feels dull.  This has been happening intermittently for last 2 weeks.  She thought it was her normal reflux pain, but today was associated with some shortness of breath.  States that shortness of breath has resolved now.  HPI: A 30 year old patient presents for evaluation of chest pain. Initial onset of pain was approximately 1-3 hours ago. The patient's chest pain is not worse with exertion. The patient's chest pain is middle- or left-sided, is not well-localized, is not described as heaviness/pressure/tightness, is not sharp and does not radiate to the arms/jaw/neck. The patient does not complain of nausea and denies diaphoresis. The patient has no history of stroke, has no history of peripheral artery disease, has not smoked in the past 90 days, denies any history of treated diabetes, has no  relevant family history of coronary artery disease (first degree relative at less than age 31), is not hypertensive, has no history of hypercholesterolemia and does not have an elevated BMI (>=30).   Past Medical History:  Diagnosis Date  . Anemia   . ANXIETY 01/27/2010  . Chronic constipation 01/22/2013  . CONSTIPATION 09/15/2009  . Depression   . GERD (gastroesophageal reflux disease)   . IBS (irritable bowel syndrome)   . Migraine 09/10/2014  . Migraines    with aura  . Obesity 09/10/2014  . Palpitation   . STD (sexually transmitted disease)    HSV2    Patient Active Problem List   Diagnosis Date Noted  . Encounter for well adult exam with abnormal findings 12/11/2018  . Anemia 12/11/2018  . Preop exam for internal medicine 12/11/2018  . IBS (irritable bowel syndrome) 08/03/2016  . Abdominal pain, RUQ 10/07/2015  . Eczema 04/29/2015  . Migraine 09/10/2014  . GERD (gastroesophageal reflux disease) 09/10/2014  . Obesity 09/10/2014  . Chronic constipation 01/22/2013  . Anxiety and depression 01/27/2010  . BACK PAIN 10/03/2008    Past Surgical History:  Procedure Laterality Date  . CHOLECYSTECTOMY    . THERAPEUTIC ABORTION     elective     OB History    Gravida  3   Para  1   Term  1   Preterm  0   AB  2   Living  1     SAB  1   IAB  1   Ectopic  0   Multiple  0   Live Births  1           Family History  Problem Relation Age of Onset  . Heart disease Mother        Pacemaker   . Diabetes Mother   . Hypertension Mother   . Stroke Maternal Grandmother   . Diabetes Maternal Grandmother   . Hypertension Maternal Grandmother   . Stroke Maternal Grandfather   . Heart disease Maternal Grandfather   . Hypertension Maternal Grandfather   . Hypertension Father   . Hypertension Maternal Aunt   . Diabetes type I Maternal Aunt   . Heart attack Maternal Aunt   . Heart disease Maternal Aunt     Social History   Tobacco Use  . Smoking status:  Never Smoker  . Smokeless tobacco: Never Used  Vaping Use  . Vaping Use: Never used  Substance Use Topics  . Alcohol use: Yes  . Drug use: Yes    Types: Marijuana    Home Medications Prior to Admission medications   Medication Sig Start Date End Date Taking? Authorizing Provider  LINZESS 145 MCG CAPS capsule Take 145 mcg by mouth daily. 09/27/18  Yes [provider]  pantoprazole (PROTONIX) 40 MG tablet Take 40 mg by mouth daily. 07/22/20  Yes [provider]  sucralfate (CARAFATE) 1 g tablet Take 1 tablet (1 g total) by mouth 4 (four) times daily -  with meals and at bedtime for 14 days. 08/01/20 08/15/20 Yes Samina Weekes, Penni Bombard, DO  Vitamin D, Ergocalciferol, (DRISDOL) 1.25 MG (50000 UT) CAPS capsule Take 1 capsule (50,000 Units total) by mouth every 7 (seven) days. Patient not taking: No sig reported 12/12/18   Corwin Levins, MD    Allergies    Norethindrone  Review of Systems   Review of Systems  Constitutional: Negative for chills and fever.  HENT: Negative for ear pain and sore throat.   Eyes: Negative for pain and visual disturbance.  Respiratory: Positive for shortness of breath. Negative for cough.   Cardiovascular: Positive for chest pain. Negative for palpitations and leg swelling.  Gastrointestinal: Negative for abdominal pain and vomiting.  Genitourinary: Negative for dysuria and hematuria.  Musculoskeletal: Negative for arthralgias and back pain.  Skin: Negative for color change and rash.  Neurological: Negative for seizures and syncope.  Psychiatric/Behavioral: The patient is nervous/anxious.   All other systems reviewed and are negative.   Physical Exam Updated Vital Signs BP (!) 140/97 (BP Location: Right Arm)   Pulse 71   Temp 98.2 F (36.8 C)   Resp 14   Ht 5\' 9"  (1.753 m)   Wt 86.2 kg   LMP 07/30/2020   SpO2 100%   BMI 28.06 kg/m   Physical Exam Vitals and nursing note reviewed.  Constitutional:      General: She is not in acute  distress.    Appearance: She is well-developed.  HENT:     Head: Normocephalic and atraumatic.  Eyes:     Extraocular Movements: Extraocular movements intact.     Conjunctiva/sclera: Conjunctivae normal.  Cardiovascular:     Rate and Rhythm: Normal rate and regular rhythm.     Pulses:          Radial pulses are 2+ on the right side and 2+ on the left side.       Dorsalis pedis pulses are 2+ on the right side and 2+ on the left side.     Heart sounds: Normal heart sounds. No murmur  heard.   Pulmonary:     Effort: Pulmonary effort is normal. No respiratory distress.     Breath sounds: Normal breath sounds.  Chest:     Chest wall: No tenderness.  Abdominal:     Palpations: Abdomen is soft.     Tenderness: There is no abdominal tenderness.  Musculoskeletal:     Cervical back: Neck supple.     Right lower leg: No tenderness. No edema.     Left lower leg: No tenderness. No edema.  Skin:    General: Skin is warm and dry.     Capillary Refill: Capillary refill takes less than 2 seconds.  Neurological:     General: No focal deficit present.     Mental Status: She is alert and oriented to person, place, and time.  Psychiatric:        Behavior: Behavior normal.     ED Results / Procedures / Treatments   Labs (all labs ordered are listed, but only abnormal results are displayed) Labs Reviewed  CBC - Abnormal; Notable for the following components:      Result Value   RBC 3.56 (*)    Hemoglobin 10.2 (*)    HCT 32.3 (*)    All other components within normal limits  BASIC METABOLIC PANEL  I-STAT BETA HCG BLOOD, ED ([facility], [facility], [facility] ONLY)  TROPONIN I (HIGH SENSITIVITY)    EKG EKG Interpretation  Date/Time:  Saturday Aug 01 2020 15:27:50 EDT Ventricular Rate:  76 PR Interval:  156 QRS Duration: 72 QT Interval:  382 QTC Calculation: 429 R Axis:   70 Text Interpretation: Normal sinus rhythm Nonspecific T wave abnormality Abnormal ECG Confirmed by Gerhard Munch [phone]) on  08/01/2020 4:13:15 PM   Radiology DG Chest 2 View  Result Date: 08/01/2020 CLINICAL DATA:  Chest pain EXAM: CHEST - 2 VIEW COMPARISON:  Chest radiograph dated 04/02/2020. FINDINGS: The heart size and mediastinal contours are within normal limits. Both lungs are clear. The visualized skeletal structures are unremarkable. IMPRESSION: No active cardiopulmonary disease. Electronically Signed   By: Romona Curls M.D.   On: 08/01/2020 16:00    Procedures Procedures   Medications Ordered in ED Medications  alum & mag hydroxide-simeth (MAALOX/MYLANTA) 200-200-20 MG/5ML suspension 30 mL (30 mLs Oral Given 08/01/20 1642)    And  lidocaine (XYLOCAINE) 2 % viscous mouth solution 15 mL (15 mLs Oral Given 08/01/20 1642)    ED Course  I have reviewed the triage vital signs and the nursing notes.  Pertinent labs & imaging results that were available during my care of the patient were reviewed by me and considered in my medical decision making (see chart for details).    MDM Rules/Calculators/A&P HEAR Score: 0                        30 year old female here with chest pain.  Here, she states that symptoms are significantly better.  Vital signs are stable, patient is not in acute distress.  Differential includes: ACS, PE, pneumothorax, pneumonia, pericardial effusion, pericarditis, myocarditis, reflux  Exam shows pain is not reproducible.  Has reassuring cardiovascular exam with bilateral pulses symmetric.  No signs or symptoms of DVT.  Lungs clear to auscultation bilaterally.  Heart regular rate and rhythm.  Presentation most consistent with reflux as she states is very similar to previous episodes, with very slight differences.  We will try GI cocktail for resolution of pain.  Less likely PE due to no significant  shortness of breath, no recent surgeries, no hormone use, no recent hospitalization or surgeries.  Additionally, has no signs or symptoms of DVT, is not tachycardic or tachypneic.  Satting 100% on  room air.  Chest x-ray does not show pneumothorax or pneumonia.  Less likely pericardial effusion, myocarditis or pericarditis.  Chest x-ray shows no acute cardiac or pulmonary abnormalities.  EKG shows normal sinus rhythm with a rate of 76.  No ischemic changes.  Pregnancy test is negative.  Patient has baseline hemoglobin, not significantly anemic.  Troponin not elevated.  BMP unremarkable.  HEAR Score 0, very little risk.  Patient is stable for discharge with close outpatient follow-up.  She agrees and feels comfortable going home.  Pain better after GI cocktail, Carafate prescribed at discharge.  States she will follow-up very closely with her normal doctor.  We discussed symptomatic management and return precautions.  Patient was discharged in stable condition.  Final Clinical Impression(s) / ED Diagnoses Final diagnoses:  Chest pain, unspecified type    Rx / DC Orders ED Discharge Orders         Ordered    sucralfate (CARAFATE) 1 g tablet  3 times daily with meals & bedtime        08/01/20 1651           Louretta Parma, DO 08/01/20 2105    Gerhard Munch, MD 08/01/20 2321

## 2020-08-01 NOTE — Discharge Instructions (Addendum)
Thank you for allowing [place] to take care of you today, we hope you feel better soon.  Follow-up with your normal doctor as we discussed. If you experience worsening chest pain, shortness of breath, weakness, numbness, or any other emergent medical condition-return to the emergency department. Try Carafate with meals and at nighttime for abdominal pain/reflux.  Thank you again,  Dr. Marylouise Stacks

## 2020-08-01 NOTE — ED Triage Notes (Signed)
C/o L sided chest pain, L arm feeling "weird", and SOB x 1 week.  States pain worse today while walking in [employer].  Nausea and vomiting yesterday.

## 2020-11-18 ENCOUNTER — Other Ambulatory Visit: Payer: Self-pay

## 2020-11-18 ENCOUNTER — Ambulatory Visit (HOSPITAL_BASED_OUTPATIENT_CLINIC_OR_DEPARTMENT_OTHER): Payer: Managed Care, Other (non HMO) | Admitting: Obstetrics & Gynecology

## 2020-11-18 ENCOUNTER — Encounter (HOSPITAL_BASED_OUTPATIENT_CLINIC_OR_DEPARTMENT_OTHER): Payer: Self-pay

## 2020-11-18 ENCOUNTER — Ambulatory Visit (INDEPENDENT_AMBULATORY_CARE_PROVIDER_SITE_OTHER): Payer: Managed Care, Other (non HMO) | Admitting: *Deleted

## 2020-11-18 ENCOUNTER — Ambulatory Visit (INDEPENDENT_AMBULATORY_CARE_PROVIDER_SITE_OTHER): Payer: Managed Care, Other (non HMO) | Admitting: Obstetrics & Gynecology

## 2020-11-18 ENCOUNTER — Other Ambulatory Visit (HOSPITAL_BASED_OUTPATIENT_CLINIC_OR_DEPARTMENT_OTHER): Payer: Self-pay | Admitting: Obstetrics & Gynecology

## 2020-11-18 VITALS — Wt 189.0 lb

## 2020-11-18 DIAGNOSIS — N912 Amenorrhea, unspecified: Secondary | ICD-10-CM

## 2020-11-18 DIAGNOSIS — O3680X0 Pregnancy with inconclusive fetal viability, not applicable or unspecified: Secondary | ICD-10-CM

## 2020-11-18 DIAGNOSIS — Z3401 Encounter for supervision of normal first pregnancy, first trimester: Secondary | ICD-10-CM

## 2020-11-18 DIAGNOSIS — Z3201 Encounter for pregnancy test, result positive: Secondary | ICD-10-CM | POA: Diagnosis not present

## 2020-11-18 DIAGNOSIS — Z8759 Personal history of other complications of pregnancy, childbirth and the puerperium: Secondary | ICD-10-CM

## 2020-11-18 DIAGNOSIS — R109 Unspecified abdominal pain: Secondary | ICD-10-CM

## 2020-11-18 DIAGNOSIS — O26899 Other specified pregnancy related conditions, unspecified trimester: Secondary | ICD-10-CM

## 2020-11-18 LAB — POCT URINE PREGNANCY: Preg Test, Ur: POSITIVE — AB

## 2020-11-18 NOTE — Progress Notes (Signed)
Pt here for confirmation of pregnancy. Test positive. Pt states that she had some spotting last week, none now. Appt given for u/s with Dr. Hyacinth Meeker to determine fetal viability. Advised to call with any questions or concerns.

## 2020-11-19 LAB — SPECIMEN STATUS REPORT

## 2020-11-19 LAB — BETA HCG QUANT (REF LAB): hCG Quant: 8392 m[IU]/mL

## 2020-11-21 ENCOUNTER — Encounter (HOSPITAL_BASED_OUTPATIENT_CLINIC_OR_DEPARTMENT_OTHER): Payer: Self-pay | Admitting: Obstetrics & Gynecology

## 2020-11-21 NOTE — Progress Notes (Signed)
GYNECOLOGY  VISIT  CC:   + UPT  HPI: 30 y.o. G5P1021 Married [place] or [place] American female here for AEX but reports her cycle was late this month and she took a pregnancy test at home that was positive.  Repeat today was positive as well.  Pt was not trying but is happy.  Did have some spotting and cramping this weekend.  Bleeding stopped this morning.  Rh positive.  D/w pt following HCG levels to ensure increasing appropriately.  Pt comfortable with plan.  Ectopic/SAB precautions given.    Medications reviewed for safety with pt.  PNV recommended as well.  Patient Active Problem List   Diagnosis Date Noted   Encounter for well adult exam with abnormal findings 12/11/2018   Anemia 12/11/2018   Preop exam for internal medicine 12/11/2018   IBS (irritable bowel syndrome) 08/03/2016   Abdominal pain, RUQ 10/07/2015   Eczema 04/29/2015   Migraine 09/10/2014   GERD (gastroesophageal reflux disease) 09/10/2014   Obesity 09/10/2014   Chronic constipation 01/22/2013   Anxiety and depression 01/27/2010   BACK PAIN 10/03/2008    Past Medical History:  Diagnosis Date   Anemia    ANXIETY 01/27/2010   Chronic constipation 01/22/2013   CONSTIPATION 09/15/2009   Depression    GERD (gastroesophageal reflux disease)    IBS (irritable bowel syndrome)    Migraine 09/10/2014   Migraines    with aura   Obesity 09/10/2014   Palpitation    STD (sexually transmitted disease)    HSV2    Past Surgical History:  Procedure Laterality Date   CHOLECYSTECTOMY     THERAPEUTIC ABORTION     elective    MEDS:   Current Outpatient Medications on File Prior to Visit  Medication Sig Dispense Refill   pantoprazole (PROTONIX) 40 MG tablet Take 40 mg by mouth daily.     sucralfate (CARAFATE) 1 g tablet Take 1 tablet (1 g total) by mouth 4 (four) times daily -  with meals and at bedtime for 14 days. 56 tablet 0   No current facility-administered medications on file prior to visit.    ALLERGIES:  Norethindrone  Family History  Problem Relation Age of Onset   Heart disease Mother        Pacemaker    Diabetes Mother    Hypertension Mother    Stroke Maternal Grandmother    Diabetes Maternal Grandmother    Hypertension Maternal Grandmother    Stroke Maternal Grandfather    Heart disease Maternal Grandfather    Hypertension Maternal Grandfather    Hypertension Father    Hypertension Maternal Aunt    Diabetes type I Maternal Aunt    Heart attack Maternal Aunt    Heart disease Maternal Aunt     SH:  married, non smoker  Review of Systems  Genitourinary:  Positive for vaginal bleeding (spotting).   PHYSICAL EXAMINATION:    LMP 10/14/2020 (Exact Date)     General appearance: alert, cooperative and appears stated age No other physical exam performed  Assessment/Plan: 1. Amenorrhea - will obtained HCG today and repeat in 48 hours.  HCG for future lab at [facility] placed and released.  If increasing appropriately, pt will proceed with decision about obstetrical care.

## 2020-11-23 ENCOUNTER — Telehealth (HOSPITAL_BASED_OUTPATIENT_CLINIC_OR_DEPARTMENT_OTHER): Payer: Self-pay | Admitting: Obstetrics & Gynecology

## 2020-11-23 ENCOUNTER — Encounter (HOSPITAL_BASED_OUTPATIENT_CLINIC_OR_DEPARTMENT_OTHER): Payer: Self-pay

## 2020-11-23 NOTE — Telephone Encounter (Signed)
Pt called in regards to recent labwork. States that [facility] could not see the order for Hcg that had been placed, therefore she had to pay $40 out of pocket. Pt to send results to [place] through mychart since we can not see them. Results reviewed with provider and pt given appt for evaluation.

## 2020-11-23 NOTE — Telephone Encounter (Signed)
Patient called today and said she went to labs on Friday and no orders was sent. And she had pay out pocket 40.00 get HCG test .And sure if they sent labs yet.Patient still have a small cramps and light spotting when she wipe.

## 2020-11-24 ENCOUNTER — Encounter (HOSPITAL_BASED_OUTPATIENT_CLINIC_OR_DEPARTMENT_OTHER): Payer: Self-pay | Admitting: Obstetrics & Gynecology

## 2020-11-25 ENCOUNTER — Ambulatory Visit (HOSPITAL_BASED_OUTPATIENT_CLINIC_OR_DEPARTMENT_OTHER): Payer: Managed Care, Other (non HMO)

## 2020-11-25 ENCOUNTER — Encounter (HOSPITAL_BASED_OUTPATIENT_CLINIC_OR_DEPARTMENT_OTHER): Payer: Self-pay | Admitting: Obstetrics & Gynecology

## 2020-11-25 ENCOUNTER — Other Ambulatory Visit: Payer: Self-pay

## 2020-11-25 ENCOUNTER — Ambulatory Visit (INDEPENDENT_AMBULATORY_CARE_PROVIDER_SITE_OTHER): Payer: Managed Care, Other (non HMO) | Admitting: Obstetrics & Gynecology

## 2020-11-25 ENCOUNTER — Ambulatory Visit (HOSPITAL_BASED_OUTPATIENT_CLINIC_OR_DEPARTMENT_OTHER): Admission: RE | Admit: 2020-11-25 | Payer: Managed Care, Other (non HMO) | Source: Ambulatory Visit

## 2020-11-25 ENCOUNTER — Other Ambulatory Visit (HOSPITAL_BASED_OUTPATIENT_CLINIC_OR_DEPARTMENT_OTHER): Payer: Managed Care, Other (non HMO)

## 2020-11-25 VITALS — BP 129/67 | HR 90 | Ht 68.0 in | Wt 186.4 lb

## 2020-11-25 DIAGNOSIS — R58 Hemorrhage, not elsewhere classified: Secondary | ICD-10-CM

## 2020-11-25 DIAGNOSIS — N920 Excessive and frequent menstruation with regular cycle: Secondary | ICD-10-CM | POA: Diagnosis not present

## 2020-11-25 DIAGNOSIS — Z3201 Encounter for pregnancy test, result positive: Secondary | ICD-10-CM

## 2020-11-25 DIAGNOSIS — O3680X0 Pregnancy with inconclusive fetal viability, not applicable or unspecified: Secondary | ICD-10-CM

## 2020-11-25 DIAGNOSIS — O26841 Uterine size-date discrepancy, first trimester: Secondary | ICD-10-CM

## 2020-11-26 ENCOUNTER — Encounter (HOSPITAL_BASED_OUTPATIENT_CLINIC_OR_DEPARTMENT_OTHER): Payer: Self-pay

## 2020-11-26 ENCOUNTER — Other Ambulatory Visit (HOSPITAL_BASED_OUTPATIENT_CLINIC_OR_DEPARTMENT_OTHER): Payer: Managed Care, Other (non HMO)

## 2020-11-26 NOTE — Progress Notes (Signed)
GYNECOLOGY  VISIT  CC:   first trimester spotting, cramping  HPI: 30 y.o. G1P1021 Married Black or [place] American female here for follow up after having unplanned positive UPT last week associated with some spotting/cramping.  HCG levels done last week.  Levels were ~8000 and then about 48 hours later ~12000.  Additional evaluation recommended.  She is here today for this.  Reports blood is now dark looking.  No bright red bleeding.  Cramping is still present.  Pain is mild.  No nausea/emesis.  EGA 6 1/7.  GYNECOLOGIC HISTORY: Patient's last menstrual period was 10/14/2020 (exact date).  Patient Active Problem List   Diagnosis Date Noted   Encounter for well adult exam with abnormal findings 12/11/2018   Anemia 12/11/2018   Preop exam for internal medicine 12/11/2018   IBS (irritable bowel syndrome) 08/03/2016   Abdominal pain, RUQ 10/07/2015   Eczema 04/29/2015   Migraine 09/10/2014   GERD (gastroesophageal reflux disease) 09/10/2014   Obesity 09/10/2014   Chronic constipation 01/22/2013   Anxiety and depression 01/27/2010   BACK PAIN 10/03/2008    Past Medical History:  Diagnosis Date   Anemia    ANXIETY 01/27/2010   Chronic constipation 01/22/2013   CONSTIPATION 09/15/2009   Depression    GERD (gastroesophageal reflux disease)    IBS (irritable bowel syndrome)    Migraine 09/10/2014   Migraines    with aura   Obesity 09/10/2014   Palpitation    STD (sexually transmitted disease)    HSV2    Past Surgical History:  Procedure Laterality Date   CHOLECYSTECTOMY     THERAPEUTIC ABORTION     elective    MEDS:   Current Outpatient Medications on File Prior to Visit  Medication Sig Dispense Refill   pantoprazole (PROTONIX) 40 MG tablet Take 40 mg by mouth daily.     No current facility-administered medications on file prior to visit.    ALLERGIES: Norethindrone  Family History  Problem Relation Age of Onset   Heart disease Mother        Pacemaker    Diabetes  Mother    Hypertension Mother    Stroke Maternal Grandmother    Diabetes Maternal Grandmother    Hypertension Maternal Grandmother    Stroke Maternal Grandfather    Heart disease Maternal Grandfather    Hypertension Maternal Grandfather    Hypertension Father    Hypertension Maternal Aunt    Diabetes type I Maternal Aunt    Heart attack Maternal Aunt    Heart disease Maternal Aunt     SH:  married, non smoker  Review of Systems  Gastrointestinal: Negative.   Genitourinary:  Positive for vaginal bleeding.       Pelvic cramping   PHYSICAL EXAMINATION:    BP 129/67 (BP Location: Left Arm, Patient Position: Sitting, Cuff Size: Large)   Pulse 90   Ht 5\' 8"  (1.727 m)   Wt 186 lb 6.4 oz (84.6 kg)   LMP 10/14/2020 (Exact Date)   BMI 28.34 kg/m     General appearance: alert, cooperative and appears stated age Abdomen: soft, non-tender; bowel sounds normal; no masses,  no organomegaly Lymph:  no inguinal LAD noted  Pelvic: External genitalia:  no lesions              Urethra:  normal appearing urethra with no masses, tenderness or lesions              Bartholins and Skenes: normal  Vagina: normal appearing vagina with normal color and discharge, no lesions              Cervix:  dark, scant blood present              Bimanual Exam:  Uterus:  normal size, contour, position, consistency, mobility, non-tender              Adnexa: no mass, fullness, tenderness               Bedside ultrasound performed.  Gestational sac, yolk sac and fetal pole noted.  Fetal pole measures 5 3/7 weeks.  FCA noted at about 80 BPM.  Adnexa on left normal.  Right not seen well.  No free fluid in anterior or posterior cul de sac  Chaperone, Raechel Ache, CMA, was present for exam.  Assessment/Plan: 1. Bleeding - miscarriage precautions given.  Pt understands where to go if has increased bleeding, cramping. - with yolk sac on u/s, findings reassuring for intrauterine pregnancy - MBT A+.  No  rhogam indicated.  2. Cramping  3. Positive pregnancy test  4. Uterine size date discrepancy pregnancy, first trimester - recheck 2 weeks if still having any spotting.  If fully resolves, will see pt for new Ob appt scheduled in 4 weeks

## 2020-12-03 ENCOUNTER — Telehealth (HOSPITAL_BASED_OUTPATIENT_CLINIC_OR_DEPARTMENT_OTHER): Payer: Self-pay | Admitting: Obstetrics & Gynecology

## 2020-12-03 ENCOUNTER — Ambulatory Visit (HOSPITAL_BASED_OUTPATIENT_CLINIC_OR_DEPARTMENT_OTHER): Payer: Managed Care, Other (non HMO) | Admitting: Obstetrics & Gynecology

## 2020-12-03 NOTE — Telephone Encounter (Signed)
Dr.Miller I didn't see the order for the ultrasound for 12/23/2020.

## 2020-12-04 ENCOUNTER — Inpatient Hospital Stay (HOSPITAL_COMMUNITY)
Admission: AD | Admit: 2020-12-04 | Discharge: 2020-12-04 | Disposition: A | Discharge status: Home / Self Care | Payer: Managed Care, Other (non HMO) | Attending: Obstetrics and Gynecology | Admitting: Obstetrics and Gynecology

## 2020-12-04 ENCOUNTER — Inpatient Hospital Stay (HOSPITAL_COMMUNITY): Payer: Managed Care, Other (non HMO)

## 2020-12-04 ENCOUNTER — Other Ambulatory Visit: Payer: Self-pay

## 2020-12-04 ENCOUNTER — Encounter (HOSPITAL_COMMUNITY): Payer: Self-pay | Admitting: Obstetrics and Gynecology

## 2020-12-04 DIAGNOSIS — O99321 Drug use complicating pregnancy, first trimester: Secondary | ICD-10-CM | POA: Insufficient documentation

## 2020-12-04 DIAGNOSIS — O99611 Diseases of the digestive system complicating pregnancy, first trimester: Secondary | ICD-10-CM | POA: Insufficient documentation

## 2020-12-04 DIAGNOSIS — E860 Dehydration: Secondary | ICD-10-CM | POA: Diagnosis not present

## 2020-12-04 DIAGNOSIS — F1290 Cannabis use, unspecified, uncomplicated: Secondary | ICD-10-CM | POA: Diagnosis not present

## 2020-12-04 DIAGNOSIS — O99281 Endocrine, nutritional and metabolic diseases complicating pregnancy, first trimester: Secondary | ICD-10-CM | POA: Diagnosis not present

## 2020-12-04 DIAGNOSIS — K219 Gastro-esophageal reflux disease without esophagitis: Secondary | ICD-10-CM | POA: Diagnosis not present

## 2020-12-04 DIAGNOSIS — Z3A01 Less than 8 weeks gestation of pregnancy: Secondary | ICD-10-CM | POA: Diagnosis not present

## 2020-12-04 DIAGNOSIS — K589 Irritable bowel syndrome without diarrhea: Secondary | ICD-10-CM | POA: Diagnosis not present

## 2020-12-04 DIAGNOSIS — O209 Hemorrhage in early pregnancy, unspecified: Secondary | ICD-10-CM | POA: Diagnosis not present

## 2020-12-04 DIAGNOSIS — Z9049 Acquired absence of other specified parts of digestive tract: Secondary | ICD-10-CM | POA: Insufficient documentation

## 2020-12-04 DIAGNOSIS — O219 Vomiting of pregnancy, unspecified: Secondary | ICD-10-CM | POA: Diagnosis not present

## 2020-12-04 LAB — URINALYSIS, MICROSCOPIC (REFLEX): Bacteria, UA: NONE SEEN

## 2020-12-04 LAB — BASIC METABOLIC PANEL
Anion gap: 9 (ref 5–15)
BUN: 6 mg/dL (ref 6–20)
CO2: 22 mmol/L (ref 22–32)
Calcium: 9.5 mg/dL (ref 8.9–10.3)
Chloride: 105 mmol/L (ref 98–111)
Creatinine, Ser: 0.59 mg/dL (ref 0.44–1.00)
GFR, Estimated: >60 mL/min (ref >60)
Glucose, Bld: 94 mg/dL (ref 70–99)
Potassium: 3.3 mmol/L — ABNORMAL LOW (ref 3.5–5.1)
Sodium: 136 mmol/L (ref 135–145)

## 2020-12-04 LAB — URINALYSIS, ROUTINE W REFLEX MICROSCOPIC
Bilirubin Urine: NEGATIVE
Glucose, UA: NEGATIVE mg/dL
Ketones, ur: 40 mg/dL — AB
Leukocytes,Ua: NEGATIVE
Nitrite: NEGATIVE
Protein, ur: NEGATIVE mg/dL
Specific Gravity, Urine: 1.02 (ref 1.005–1.030)
pH: 6.5 (ref 5.0–8.0)

## 2020-12-04 MED ORDER — PANTOPRAZOLE SODIUM 20 MG PO TBEC: 20.0000 mg | DELAYED_RELEASE_TABLET | Freq: Every day | ORAL | 0 refills | Status: DC

## 2020-12-04 MED ORDER — LACTATED RINGERS IV SOLN: Freq: Once | INTRAVENOUS | Status: AC

## 2020-12-04 MED ORDER — DOXYLAMINE-PYRIDOXINE 10-10 MG PO TBEC: 2.0000 | DELAYED_RELEASE_TABLET | Freq: Every evening | ORAL | 1 refills | Status: DC | PRN

## 2020-12-04 MED ORDER — SODIUM CHLORIDE 0.9 % IV SOLN
12.5000 mg | Freq: Once | INTRAVENOUS | Status: AC
  Administered 2020-12-04: 12.5 mg via INTRAVENOUS
  Filled 2020-12-04: qty 0.5

## 2020-12-04 NOTE — MAU Note (Signed)
Vomiting all day Thurs and cramping. Some brown vag d/c when I wipe.

## 2020-12-04 NOTE — MAU Provider Note (Signed)
Chief Complaint: Emesis During Pregnancy   Event Date/Time   First Provider Initiated Contact with Patient 12/04/20 0536        SUBJECTIVE HPI: Linda Fuller is a 30 y.o. [id number] at [redacted]w[redacted]d by LMP who presents to maternity admissions reporting vomiting all day.  Did not take anything for it.  Very concerned about miscarriage due to brown bleeding.  Very much wants an [place] to check on baby. She denies vaginal itching/burning, urinary symptoms, h/a, dizziness, n/v, or fever/chills.    Emesis  This is a recurrent problem. The current episode started today. The problem has been unchanged. Pertinent negatives include no abdominal pain, chills, diarrhea, dizziness or fever. She has tried nothing for the symptoms.   RN Note: Vomiting all day Thurs and cramping. Some brown vag d/c when I wipe.  Past Medical History:  Diagnosis Date   Anemia    ANXIETY 01/27/2010   Chronic constipation 01/22/2013   CONSTIPATION 09/15/2009   Depression    GERD (gastroesophageal reflux disease)    IBS (irritable bowel syndrome)    Migraine 09/10/2014   Migraines    with aura   Obesity 09/10/2014   Palpitation    STD (sexually transmitted disease)    HSV2   Past Surgical History:  Procedure Laterality Date   CHOLECYSTECTOMY     THERAPEUTIC ABORTION     elective   Social History   Socioeconomic History   Marital status: Married    Spouse name: Not on file   Number of children: 0   Years of education: Not on file   Highest education level: Not on file  Occupational History   Occupation: Administrator, sports    Employer: STUDENT    Employer: OTHER    Comment: Agricultural consultant  Tobacco Use   Smoking status: Never   Smokeless tobacco: Never  Vaping Use   Vaping Use: Never used  Substance and Sexual Activity   Alcohol use: Not Currently   Drug use: Yes    Types: Marijuana   Sexual activity: Yes    Partners: Male    Birth control/protection: Coitus interruptus, None  Other Topics Concern   Not on  file  Social History Narrative   Work- 2 jobs - Horticulturist, 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   Current Outpatient Medications on File Prior to Encounter  Medication Sig Dispense Refill   pantoprazole (PROTONIX) 40 MG tablet Take 40 mg by mouth daily.     Allergies  Allergen Reactions   Norethindrone Rash    I have reviewed patient's Past Medical Hx, Surgical Hx, Family Hx, Social Hx, medications and allergies.   ROS:  Review of Systems  Constitutional:  Negative for chills and fever.  Gastrointestinal:  Positive for vomiting. Negative for abdominal pain and diarrhea.  Neurological:  Negative for dizziness.  Review of Systems  Other systems negative   Physical Exam  Physical Exam Patient Vitals for the past 24 hrs:  BP Temp Pulse Resp Height Weight  12/04/20 0515 133/82 -- 71 -- -- --  12/04/20 0514 --  98.2 F (36.8 C) -- 18 5\' 8"  (1.727 m) 83.5 kg   Constitutional: Well-developed, well-nourished female in no acute distress.  Cardiovascular: normal rate Respiratory: normal effort GI: Abd soft, non-tender.  MS: Extremities nontender, no edema, normal ROM Neurologic: Alert and oriented x 4.  GU: Neg CVAT.  PELVIC EXAM: deferred.  Only scant brown spotting. No pain  LAB RESULTS Results for orders placed or performed during the hospital encounter of 12/04/20 (from the past 24 hour(s))  Urinalysis, Routine w reflex microscopic Urine, Clean Catch     Status: Abnormal   Collection Time: 12/04/20  5:20 AM  Result Value Ref Range   Color, Urine YELLOW YELLOW   APPearance CLEAR CLEAR    Specific Gravity, Urine 1.020 1.005 - 1.030   pH 6.5 5.0 - 8.0   Glucose, UA NEGATIVE NEGATIVE mg/dL   Hgb urine dipstick SMALL (A) NEGATIVE   Bilirubin Urine NEGATIVE NEGATIVE   Ketones, ur 40 (A) NEGATIVE mg/dL   Protein, ur NEGATIVE NEGATIVE mg/dL   Nitrite NEGATIVE NEGATIVE   Leukocytes,Ua NEGATIVE NEGATIVE  Urinalysis, Microscopic (reflex)     Status: None   Collection Time: 12/04/20  5:20 AM  Result Value Ref Range   RBC / HPF 0-5 0 - 5 RBC/hpf   WBC, UA 0-5 0 - 5 WBC/hpf   Bacteria, UA NONE SEEN NONE SEEN   Squamous Epithelial / LPF 0-5 0 - 5   Mucus PRESENT   Basic metabolic panel     Status: Abnormal   Collection Time: 12/04/20  6:04 AM  Result Value Ref Range   Sodium 136 135 - 145 mmol/L   Potassium 3.3 (L) 3.5 - 5.1 mmol/L   Chloride 105 98 - 111 mmol/L   CO2 22 22 - 32 mmol/L   Glucose, Bld 94 70 - 99 mg/dL   BUN 6 6 - 20 mg/dL   Creatinine, Ser 02/03/21 0.44 - 1.00 mg/dL   Calcium 9.5 8.9 - 5.39 mg/dL   GFR, Estimated 76.7 >34 mL/min   Anion gap 9 5 - 15     IMAGING >19 OB Transvaginal  Result Date: 12/04/2020 CLINICAL DATA:  30 year old female with brown discharge. Outpatient ultrasound last week demonstrated an embryo with cardiac activity. Estimated gestational age by LMP 7 weeks and 2 days. EXAM: TRANSVAGINAL OB ULTRASOUND TECHNIQUE: Transvaginal ultrasound was performed for complete evaluation of the gestation as well as the maternal uterus, adnexal regions, and pelvic cul-de-sac. COMPARISON:  None relevant. FINDINGS: Intrauterine gestational sac: Single Yolk sac:  Visible Embryo:  Visible Cardiac Activity: Detected Heart Rate: 158 bpm CRL:   11.8 mm   7 w 2 d                  37 EDC: 07/21/2021 Subchorionic hemorrhage:  None visualized. Maternal uterus/adnexae: No pelvic free fluid. The left ovary appears normal measuring 2.6 x 1.6 x 1.8 cm, with probable corpus luteum (image 28) and several small follicles. Right ovary appears normal measuring 3.0 x 1.6 x 2.2 cm.  IMPRESSION: Single living IUP with an estimated gestational age of [redacted] weeks and 2 days by crown-rump length. No acute maternal findings visualized. Electronically Signed   By: 07/23/2021 M.D.   On: 12/04/2020 06:58     MAU Management/MDM: Ordered bmet which showed very mild hypokalemia Will likely be repleted when she eats IV fluids with Phenergan given with relief of nausea.  Able to tolerate PO intake 02/03/2021 is reassuring for fetal development  ASSESSMENT Single  IUP at [redacted]w[redacted]d Nausea and vomiting Mild dehydration Brown bleeding with viable fetus  PLAN Discharge home Rx Protonix for reflux Rx Diclegis for nausea Encouraged to start prenatal care  Pt stable at time of discharge. Encouraged to return here if she develops worsening of symptoms, increase in pain, fever, or other concerning symptoms.    Wynelle Bourgeois CNM, MSN Certified Nurse-Midwife 12/04/2020  5:37 AM

## 2020-12-09 ENCOUNTER — Other Ambulatory Visit (HOSPITAL_BASED_OUTPATIENT_CLINIC_OR_DEPARTMENT_OTHER): Payer: Self-pay | Admitting: Obstetrics & Gynecology

## 2020-12-23 ENCOUNTER — Encounter (HOSPITAL_BASED_OUTPATIENT_CLINIC_OR_DEPARTMENT_OTHER): Payer: Self-pay | Admitting: Obstetrics & Gynecology

## 2020-12-23 ENCOUNTER — Ambulatory Visit (INDEPENDENT_AMBULATORY_CARE_PROVIDER_SITE_OTHER): Payer: Managed Care, Other (non HMO) | Admitting: Obstetrics & Gynecology

## 2020-12-23 ENCOUNTER — Other Ambulatory Visit (HOSPITAL_BASED_OUTPATIENT_CLINIC_OR_DEPARTMENT_OTHER): Payer: Managed Care, Other (non HMO)

## 2020-12-23 ENCOUNTER — Other Ambulatory Visit: Payer: Self-pay

## 2020-12-23 ENCOUNTER — Ambulatory Visit (INDEPENDENT_AMBULATORY_CARE_PROVIDER_SITE_OTHER): Payer: Managed Care, Other (non HMO) | Admitting: *Deleted

## 2020-12-23 ENCOUNTER — Ambulatory Visit (INDEPENDENT_AMBULATORY_CARE_PROVIDER_SITE_OTHER): Payer: Managed Care, Other (non HMO)

## 2020-12-23 ENCOUNTER — Other Ambulatory Visit (HOSPITAL_BASED_OUTPATIENT_CLINIC_OR_DEPARTMENT_OTHER): Payer: Self-pay | Admitting: *Deleted

## 2020-12-23 ENCOUNTER — Other Ambulatory Visit (HOSPITAL_COMMUNITY)
Admission: RE | Admit: 2020-12-23 | Discharge: 2020-12-23 | Disposition: A | Discharge status: Home / Self Care | Payer: Managed Care, Other (non HMO) | Source: Ambulatory Visit | Attending: Obstetrics & Gynecology | Admitting: Obstetrics & Gynecology

## 2020-12-23 VITALS — BP 124/80 | HR 84 | Wt 183.0 lb

## 2020-12-23 DIAGNOSIS — O98511 Other viral diseases complicating pregnancy, first trimester: Secondary | ICD-10-CM | POA: Insufficient documentation

## 2020-12-23 DIAGNOSIS — O3680X0 Pregnancy with inconclusive fetal viability, not applicable or unspecified: Secondary | ICD-10-CM

## 2020-12-23 DIAGNOSIS — Z331 Pregnant state, incidental: Secondary | ICD-10-CM

## 2020-12-23 DIAGNOSIS — R350 Frequency of micturition: Secondary | ICD-10-CM | POA: Diagnosis not present

## 2020-12-23 DIAGNOSIS — Z3491 Encounter for supervision of normal pregnancy, unspecified, first trimester: Secondary | ICD-10-CM

## 2020-12-23 DIAGNOSIS — Z3481 Encounter for supervision of other normal pregnancy, first trimester: Secondary | ICD-10-CM

## 2020-12-23 DIAGNOSIS — O219 Vomiting of pregnancy, unspecified: Secondary | ICD-10-CM | POA: Insufficient documentation

## 2020-12-23 DIAGNOSIS — B009 Herpesviral infection, unspecified: Secondary | ICD-10-CM

## 2020-12-23 DIAGNOSIS — Z3480 Encounter for supervision of other normal pregnancy, unspecified trimester: Secondary | ICD-10-CM | POA: Insufficient documentation

## 2020-12-23 DIAGNOSIS — Z3A10 10 weeks gestation of pregnancy: Secondary | ICD-10-CM | POA: Diagnosis not present

## 2020-12-23 LAB — POCT URINALYSIS DIPSTICK OB
Appearance: NORMAL
Bilirubin, UA: NEGATIVE
Glucose, UA: NEGATIVE
Leukocytes, UA: NEGATIVE
Nitrite, UA: NEGATIVE
Spec Grav, UA: 1.025 (ref 1.010–1.025)
Urobilinogen, UA: 0.2 E.U./dL
pH, UA: 6 (ref 5.0–8.0)

## 2020-12-23 LAB — OB RESULTS CONSOLE GBS: GBS: POSITIVE

## 2020-12-23 MED ORDER — PRENATAL 27-1 MG PO TABS: 1.0000 | ORAL_TABLET | Freq: Every day | ORAL | 6 refills | Status: AC

## 2020-12-23 NOTE — Progress Notes (Signed)
Pt here for New OB interview. No complaints of vaginal bleeding. Information packet given.

## 2020-12-23 NOTE — Progress Notes (Signed)
Updated order to match clinic performed since Dr Hyacinth Meeker is performing [place] kW CMA

## 2020-12-23 NOTE — Progress Notes (Signed)
History:   Linda Fuller is a 30 y.o. [phone] at [redacted]w[redacted]d by LMP being seen today for her first obstetrical visit.  Her obstetrical history is significant for  nausea and vomiting in the first trimester, h/o HSV.  Reports she is  keeping some liquids down and solid foods but has to eat/drink at particular times in the day . Patient does intend to breast feed. Pregnancy history fully reviewed.    HISTORY: OB History  Gravida Para Term Preterm AB Living  4 1 1  0 2 1  SAB IAB Ectopic Multiple Live Births  1 1 0 0 1    # Outcome Date GA Lbr Len/2nd Weight Sex Delivery Anes PTL Lv  4 Current           3 Term 10/15/11 [redacted]w[redacted]d 08:30 / 00:14 8 lb 4 oz (3.742 kg) M Vag-Spont EPI  LIV     Birth Comments: WDL     Name: BECK,BOY Rhea     Apgar1: 9  Apgar5: 9  2 SAB           1 IAB             Last pap smear was done 11/15/18 and was normal  Past Medical History:  Diagnosis Date   Anemia    ANXIETY 01/27/2010   Chronic constipation 01/22/2013   CONSTIPATION 09/15/2009   Depression    GERD (gastroesophageal reflux disease)    IBS (irritable bowel syndrome)    Migraine 09/10/2014   Migraines    with aura   Obesity 09/10/2014   Palpitation    STD (sexually transmitted disease)    HSV2   Past Surgical History:  Procedure Laterality Date   CHOLECYSTECTOMY     THERAPEUTIC ABORTION     elective   Family History  Problem Relation Age of Onset   Heart disease Mother        Pacemaker    Diabetes Mother    Hypertension Mother    Hypertension Father    Hypertension Maternal Aunt    Diabetes type I Maternal Aunt    Heart attack Maternal Aunt    Heart disease Maternal Aunt    Stroke Maternal Grandmother    Diabetes Maternal Grandmother    Hypertension Maternal Grandmother    Stroke Maternal Grandfather    Heart disease Maternal Grandfather    Hypertension Maternal Grandfather    Social History   Tobacco Use   Smoking status: Never   Smokeless tobacco: Never  Vaping Use   Vaping  Use: Never used  Substance Use Topics   Alcohol use: Not Currently   Drug use: Not Currently    Types: Marijuana    Comment: 2 weeks ago when had positive UPT-has stopped   Allergies  Allergen Reactions   Norethindrone Rash   Current Outpatient Medications on File Prior to Visit  Medication Sig Dispense Refill   Doxylamine-Pyridoxine (DICLEGIS) 10-10 MG TBEC Take 2 tablets by mouth at bedtime as needed (nausea). 60 tablet 1   pantoprazole (PROTONIX) 20 MG tablet Take 1 tablet (20 mg total) by mouth daily. 30 tablet 0   No current facility-administered medications on file prior to visit.    Review of Systems Pertinent items noted in HPI and remainder of comprehensive ROS otherwise negative.  Physical Exam:   Vitals:   12/23/20 1412  BP: 124/80  Pulse: 84  Weight: 183 lb (83 kg)     Bedside Ultrasound for FHR check: Viable intrauterine pregnancy with  positive cardiac activity noted, CRL c/w 10 2/7 days.  Fetal heart rate 155bpm.  Patient informed that the ultrasound is considered a limited obstetric ultrasound and is not intended to be a complete ultrasound exam.  Patient also informed that the ultrasound is not being completed with the intent of assessing for fetal or placental anomalies or any pelvic abnormalities.  Explained that the purpose of today's ultrasound is to assess for fetal heart rate.  Patient acknowledges the purpose of the exam and the limitations of the study. General: well-developed, well-nourished female in no acute distress  Breasts:  normal appearance, no masses or tenderness bilaterally  Skin: normal coloration and turgor, no rashes  Neurologic: oriented, normal, negative, normal mood  Extremities: normal strength, tone, and muscle mass, ROM of all joints is normal  HEENT PERRLA, extraocular movement intact and sclera clear, anicteric  Neck supple and no masses  Cardiovascular: regular rate and rhythm  Respiratory:  no respiratory distress, normal  breath sounds  Abdomen: soft, non-tender; bowel sounds normal; no masses,  no organomegaly  Pelvic: deferred    Assessment:    Pregnancy: [id number] Patient Active Problem List   Diagnosis Date Noted   Supervision of other normal pregnancy, antepartum 12/23/2020   Anemia 12/11/2018   IBS (irritable bowel syndrome) 08/03/2016   GERD (gastroesophageal reflux disease) 09/10/2014   Anxiety and depression 01/27/2010     Plan:    1. Supervision of other normal pregnancy, antepartum 1. [redacted] weeks gestation of pregnancy - ABO/Rh - Antibody screen - CBC - Hepatitis B surface antigen - HIV Antibody (routine testing w rflx) - HIV (Save tube for possible reflex) - RPR - Rubella screen - Hepatitis C antibody - Urine Culture - Cervicovaginal ancillary only( [place]) - Babyscripts Schedule Optimization - Genetic Screening  2. Encounter for supervision of other normal pregnancy in first trimester - US OB Comp + 14 Wk; Future  3. Nausea/vomiting in pregnancy - correct dosage of Diclegis reviewed.  Pt will give update after the weekend regarding response.    4. HSV-2 (herpes simplex virus 2) infection   Initial labs drawn. Continue prenatal vitamins. Problem list reviewed and updated. Genetic Screening discussed, NIPS: requested. Ultrasound discussed; fetal anatomic survey: requested. Anticipatory guidance about prenatal visits given including labs, ultrasounds, and testing.  Discussed usage of Babyscripts and virtual visits as additional source of managing and completing prenatal visits in midst of coronavirus and pandemic.   Encouraged to complete MyChart Registration for her ability to review results, send requests, and have questions addressed.  The nature of [facility] with multiple MDs and Advanced Practice Providers was explained to patient; also emphasized that residents, students are part of our team. Routine obstetric  precautions reviewed. Encouraged to seek out care at office or emergency room [facility] MAU preferred) for urgent and/or emergent concerns. No follow-ups on file.     Lum Keas, MD, FACOG Obstetrician & Gynecologist, [facility] for [facility], [facility]

## 2020-12-24 ENCOUNTER — Encounter (HOSPITAL_BASED_OUTPATIENT_CLINIC_OR_DEPARTMENT_OTHER): Payer: Self-pay | Admitting: Obstetrics & Gynecology

## 2020-12-24 DIAGNOSIS — O219 Vomiting of pregnancy, unspecified: Secondary | ICD-10-CM | POA: Insufficient documentation

## 2020-12-24 DIAGNOSIS — B009 Herpesviral infection, unspecified: Secondary | ICD-10-CM | POA: Insufficient documentation

## 2020-12-24 LAB — CERVICOVAGINAL ANCILLARY ONLY
Chlamydia: NEGATIVE
Comment: NEGATIVE
Comment: NORMAL
Neisseria Gonorrhea: NEGATIVE

## 2020-12-24 LAB — RPR: RPR Ser Ql: NONREACTIVE

## 2020-12-24 LAB — CBC
Hematocrit: 29.4 % — ABNORMAL LOW (ref 34.0–46.6)
Hemoglobin: 9.8 g/dL — ABNORMAL LOW (ref 11.1–15.9)
MCH: 29.9 pg (ref 26.6–33.0)
MCHC: 33.3 g/dL (ref 31.5–35.7)
MCV: 90 fL (ref 79–97)
Platelets: 283 10*3/uL (ref 150–450)
RBC: 3.28 x10E6/uL — ABNORMAL LOW (ref 3.77–5.28)
RDW: 14.3 % (ref 11.7–15.4)
WBC: 5.1 10*3/uL (ref 3.4–10.8)

## 2020-12-24 LAB — HEPATITIS C ANTIBODY: Hep C Virus Ab: <0.1 s/co ratio (ref 0.0–0.9)

## 2020-12-24 LAB — ANTIBODY SCREEN: Antibody Screen: NEGATIVE

## 2020-12-24 LAB — HIV ANTIBODY (ROUTINE TESTING W REFLEX): HIV Screen 4th Generation wRfx: NONREACTIVE

## 2020-12-24 LAB — ABO/RH: Rh Factor: POSITIVE

## 2020-12-24 LAB — HEPATITIS B SURFACE ANTIGEN: Hepatitis B Surface Ag: NEGATIVE

## 2020-12-24 LAB — RUBELLA SCREEN: Rubella Antibodies, IGG: 2.52 index (ref >0.99)

## 2020-12-27 LAB — URINE CULTURE

## 2021-01-06 ENCOUNTER — Encounter (HOSPITAL_BASED_OUTPATIENT_CLINIC_OR_DEPARTMENT_OTHER): Payer: Self-pay | Admitting: Obstetrics & Gynecology

## 2021-01-06 ENCOUNTER — Telehealth (HOSPITAL_BASED_OUTPATIENT_CLINIC_OR_DEPARTMENT_OTHER): Payer: Self-pay

## 2021-01-06 NOTE — Telephone Encounter (Signed)
Patient called to let us know that she is not feeling well. She has not gotten any better. She wanted to know if she could go forward with the iron infusions. Please advise. tbw

## 2021-01-21 ENCOUNTER — Encounter (HOSPITAL_BASED_OUTPATIENT_CLINIC_OR_DEPARTMENT_OTHER): Payer: Managed Care, Other (non HMO) | Admitting: Obstetrics & Gynecology

## 2021-02-03 ENCOUNTER — Other Ambulatory Visit: Payer: Self-pay | Admitting: Advanced Practice Midwife

## 2021-02-16 IMAGING — DX DG CHEST 2V
2 series · 2 of 2 positions shown · non-contrast
Comparison: 09/11/2017

CLINICAL DATA: Sore throat.

EXAM:
CHEST - 2 VIEW

[chest pa]
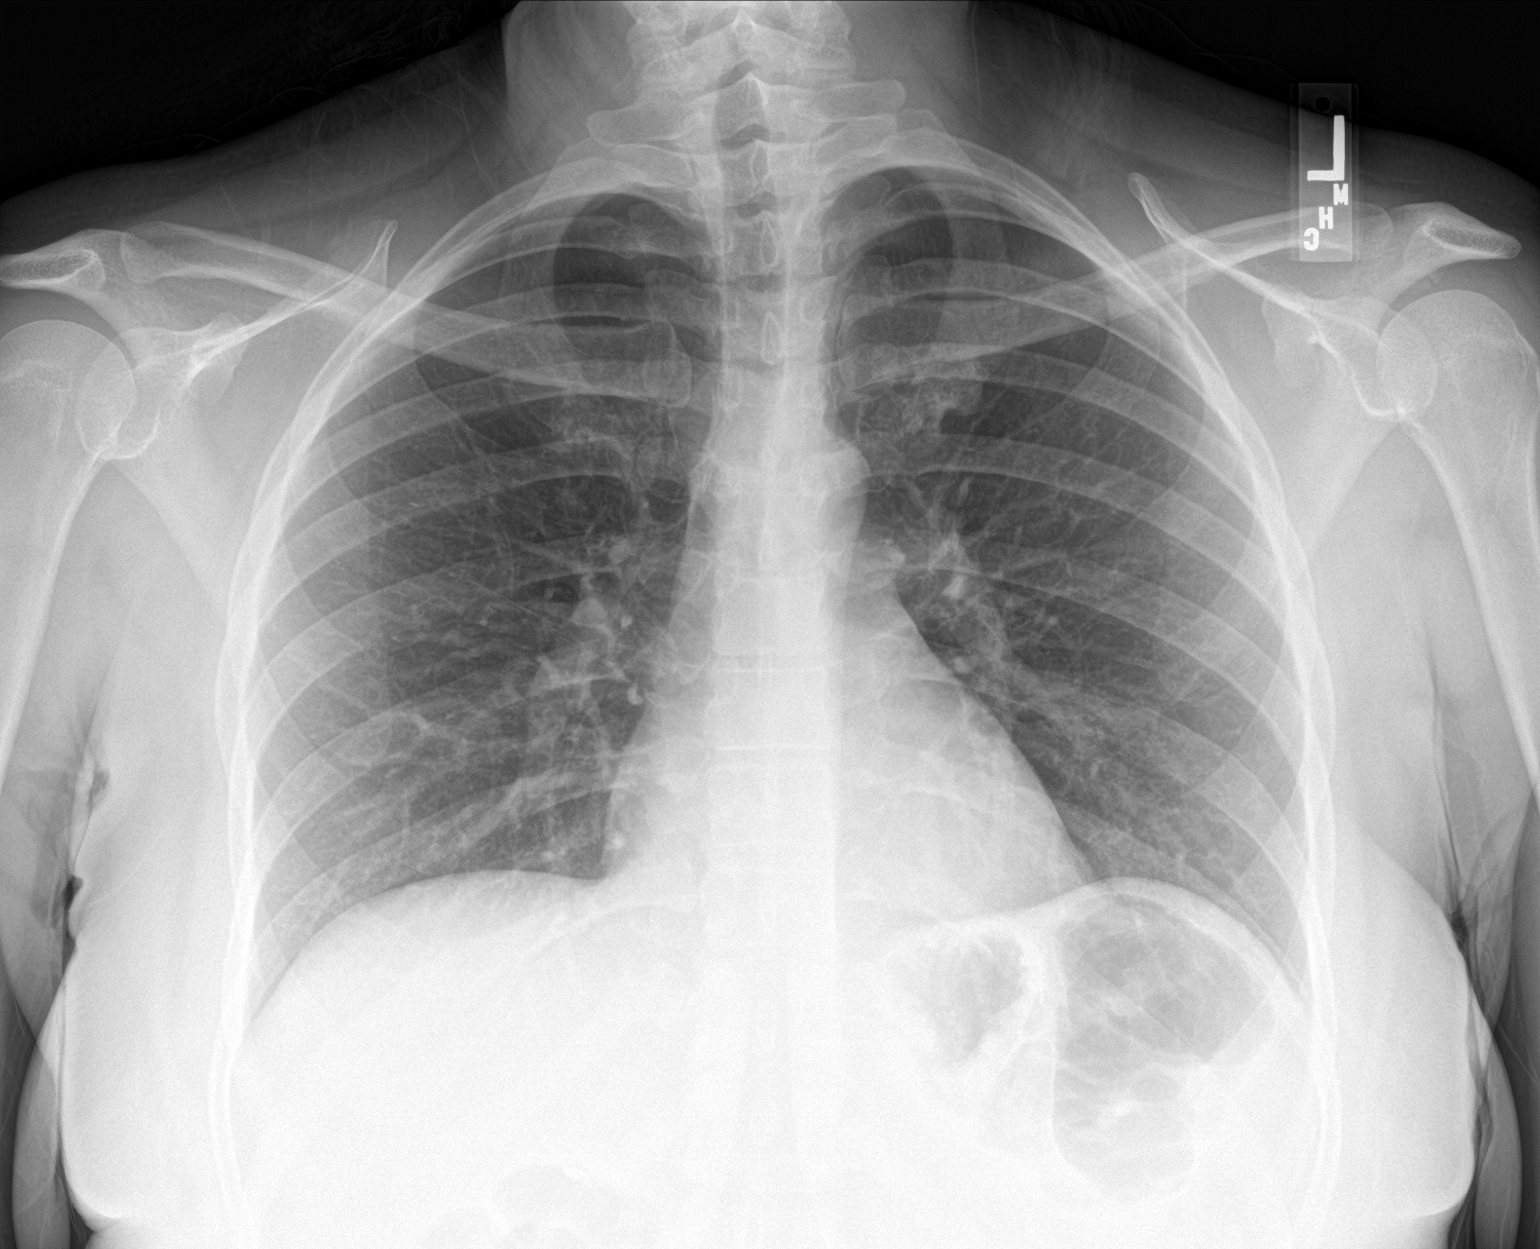

[chest lat]
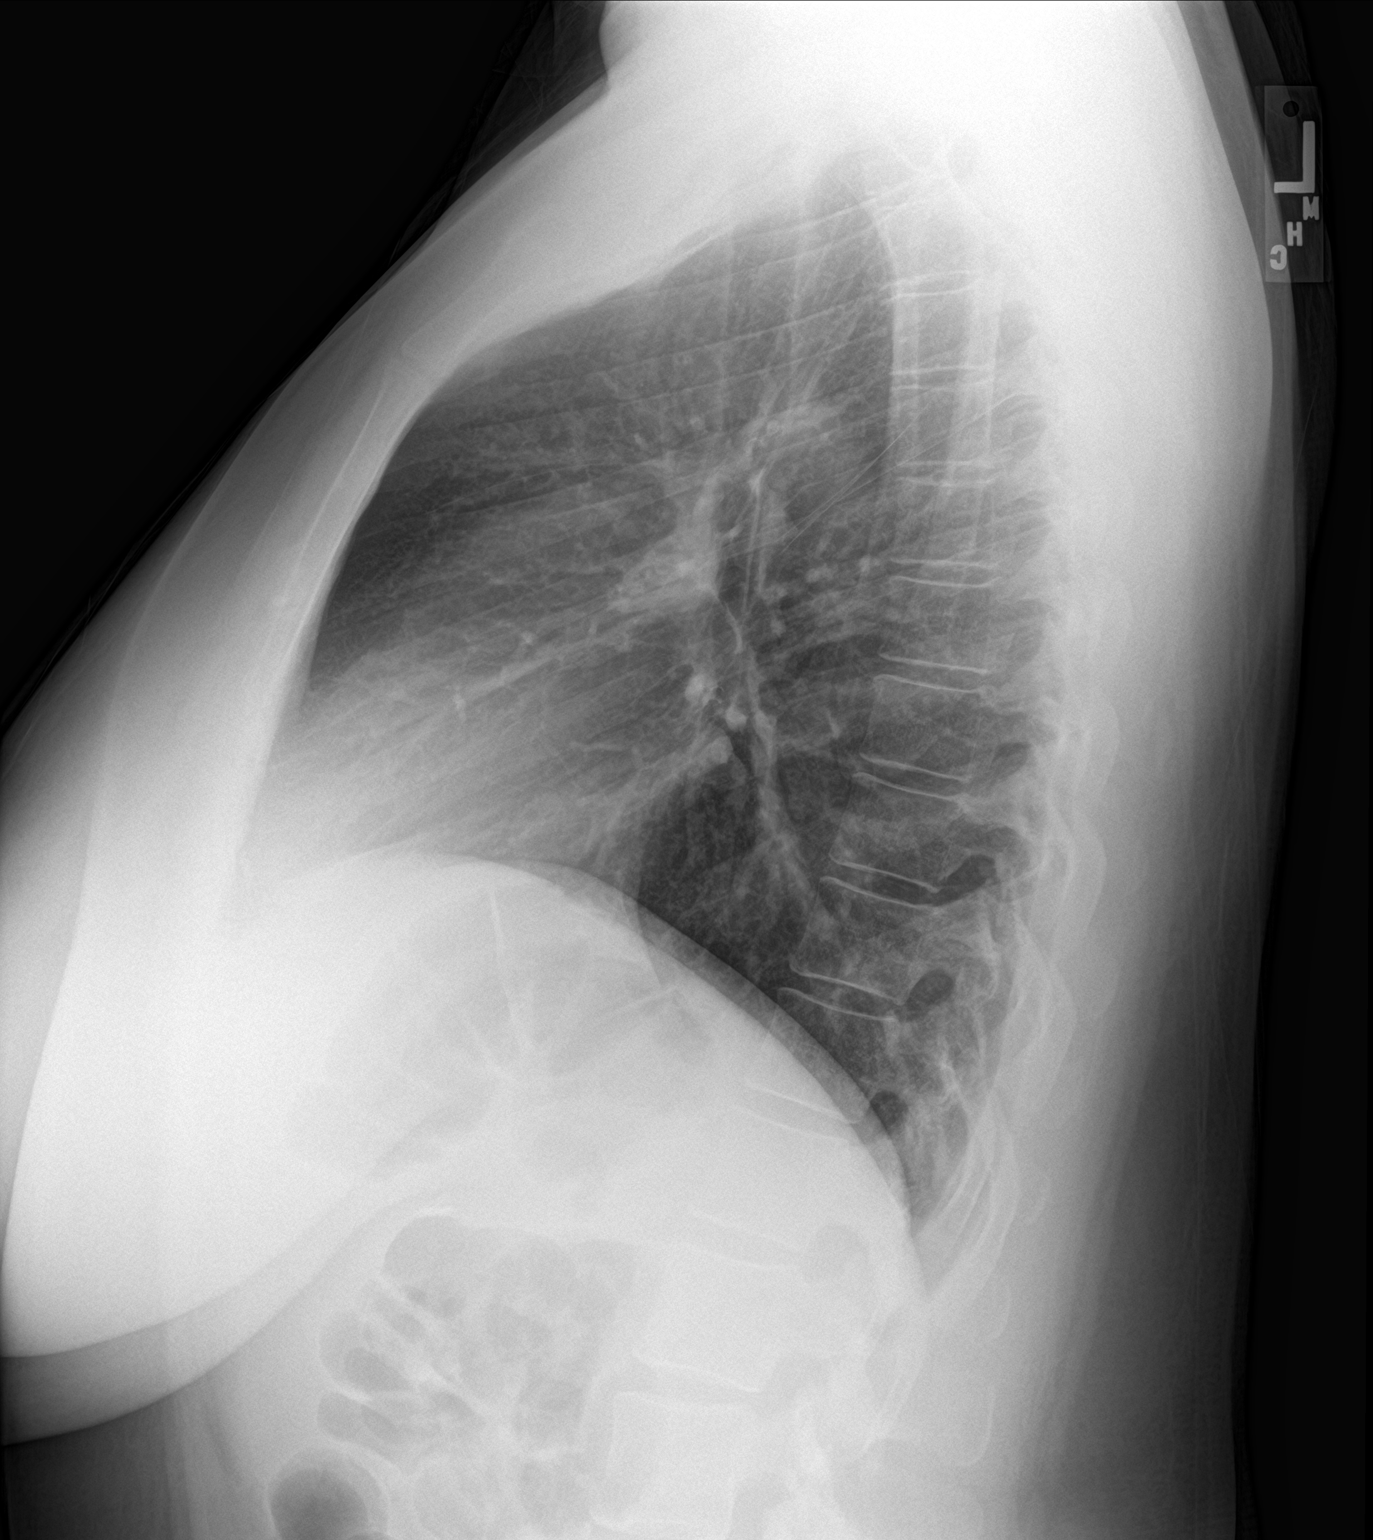

[2 of 2 positions shown; findings below may reference images not displayed]

FINDINGS: Normal heart size and mediastinal contours. No acute infiltrate or
edema. No effusion or pneumothorax. No acute osseous findings.
IMPRESSION: Negative chest.

## 2021-02-22 ENCOUNTER — Encounter (HOSPITAL_BASED_OUTPATIENT_CLINIC_OR_DEPARTMENT_OTHER): Payer: Managed Care, Other (non HMO) | Admitting: Obstetrics & Gynecology

## 2021-02-24 ENCOUNTER — Encounter (HOSPITAL_BASED_OUTPATIENT_CLINIC_OR_DEPARTMENT_OTHER): Payer: Managed Care, Other (non HMO) | Admitting: Obstetrics & Gynecology

## 2021-02-24 ENCOUNTER — Ambulatory Visit: Payer: Managed Care, Other (non HMO)

## 2021-03-28 NOTE — L&D Delivery Note (Signed)
Delivery Note ?At 7:45 PM a viable female was delivered via Vaginal, Spontaneous (Presentation: Left Occiput Anterior).  APGAR: 9, 9; weight  pending.   ?Placenta status: Spontaneous, Intact.  Cord: 3 vessels with the following complications: None.  Cord pH: n/a ? ?Anesthesia: Epidural ?Episiotomy:  n/a ?Lacerations:  none ?Suture Repair:  n/a ?Est. Blood Loss (mL):  150 ? ?Mom to postpartum.  Baby to Couplet care / Skin to Skin. ? ?Linda Fuller ?06/30/2021, 7:55 PM ? ? ? ?

## 2021-03-31 ENCOUNTER — Encounter (HOSPITAL_BASED_OUTPATIENT_CLINIC_OR_DEPARTMENT_OTHER): Payer: Managed Care, Other (non HMO) | Admitting: Obstetrics & Gynecology

## 2021-04-26 ENCOUNTER — Encounter (HOSPITAL_BASED_OUTPATIENT_CLINIC_OR_DEPARTMENT_OTHER): Payer: Managed Care, Other (non HMO) | Admitting: Obstetrics & Gynecology

## 2021-05-24 ENCOUNTER — Encounter (HOSPITAL_BASED_OUTPATIENT_CLINIC_OR_DEPARTMENT_OTHER): Payer: Managed Care, Other (non HMO) | Admitting: Obstetrics & Gynecology

## 2021-06-10 ENCOUNTER — Encounter (HOSPITAL_BASED_OUTPATIENT_CLINIC_OR_DEPARTMENT_OTHER): Payer: Managed Care, Other (non HMO) | Admitting: Obstetrics & Gynecology

## 2021-06-17 IMAGING — DX DG CHEST 2V
2 series · 2 of 2 positions shown · non-contrast
Comparison: Chest radiograph dated 04/02/2020.

CLINICAL DATA: Chest pain

EXAM:
CHEST - 2 VIEW

[chest pa]
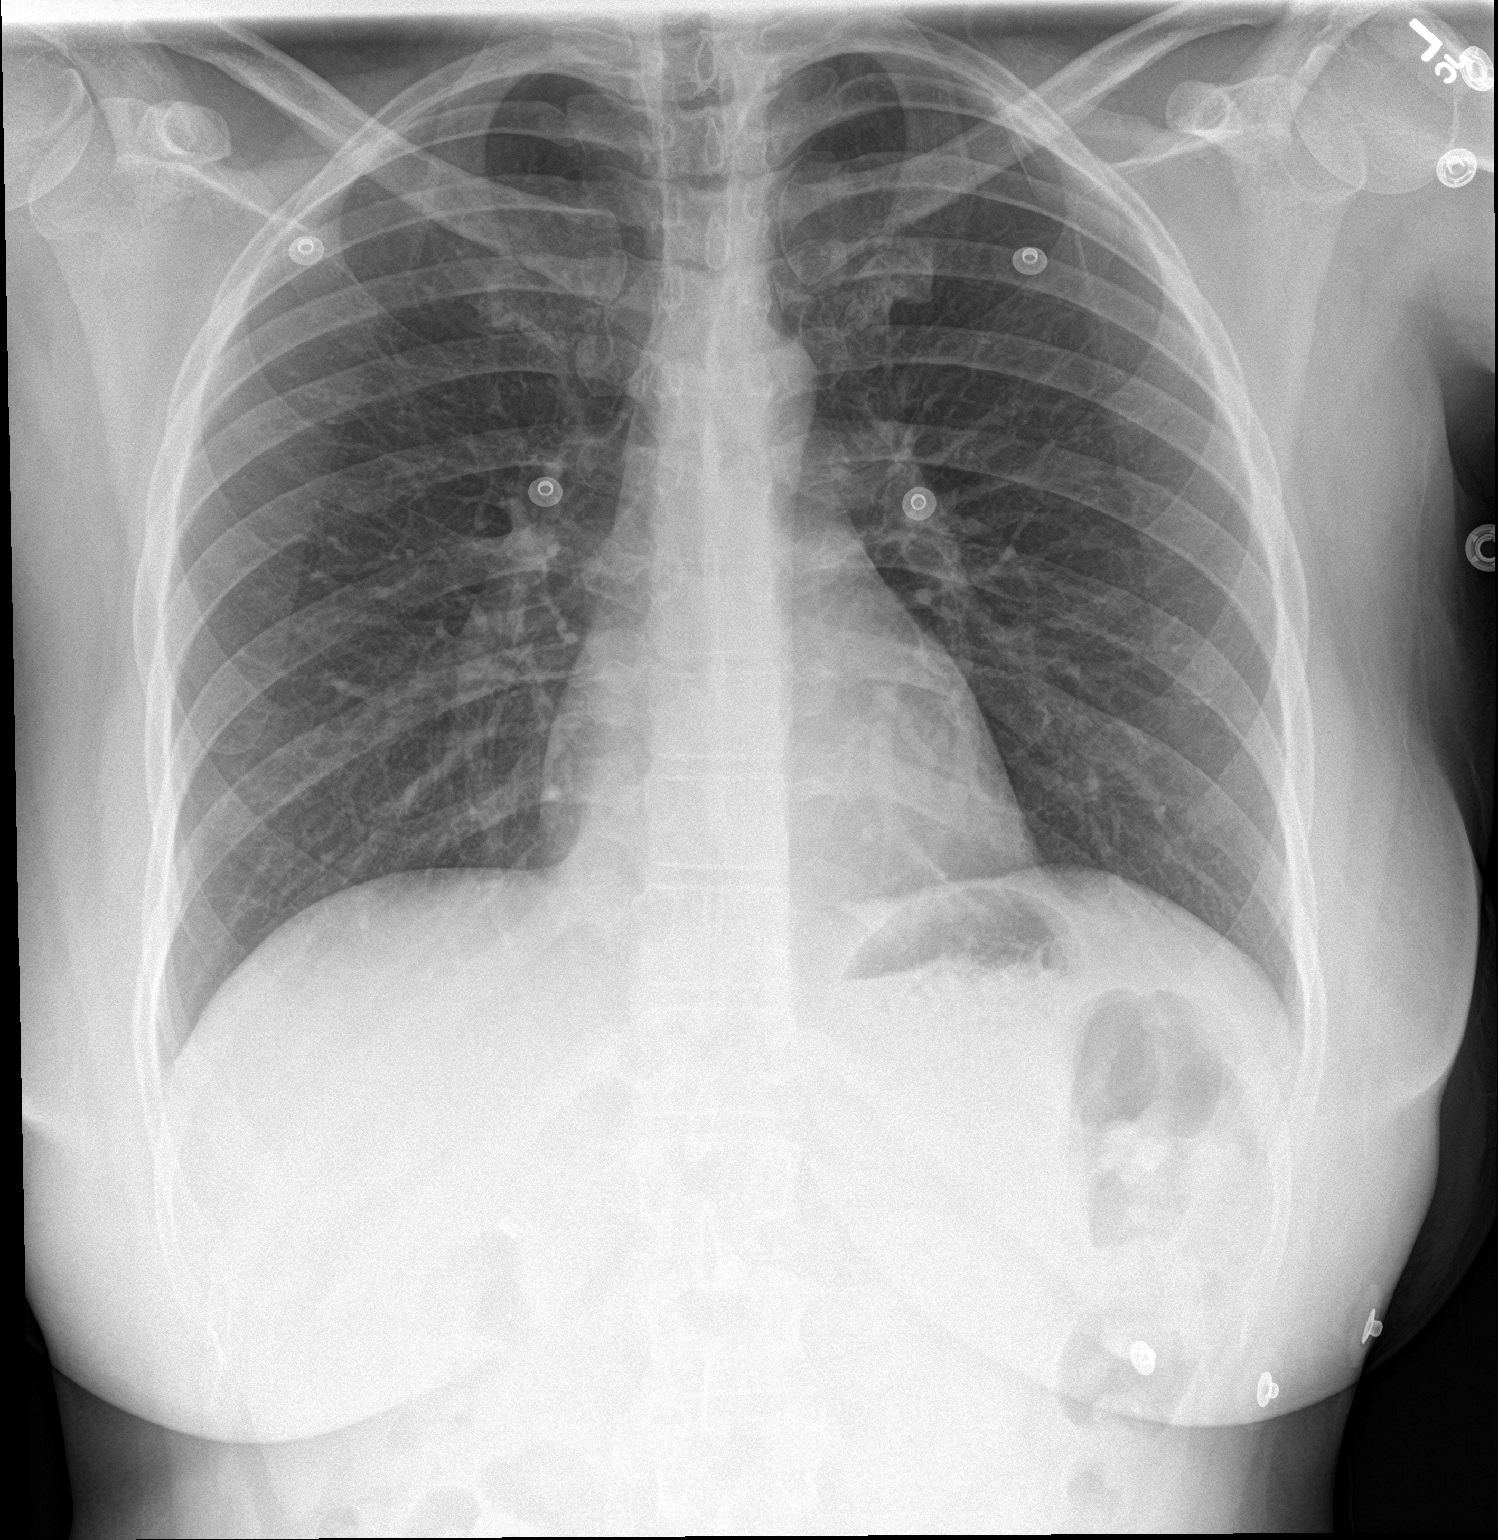

[chest lat]
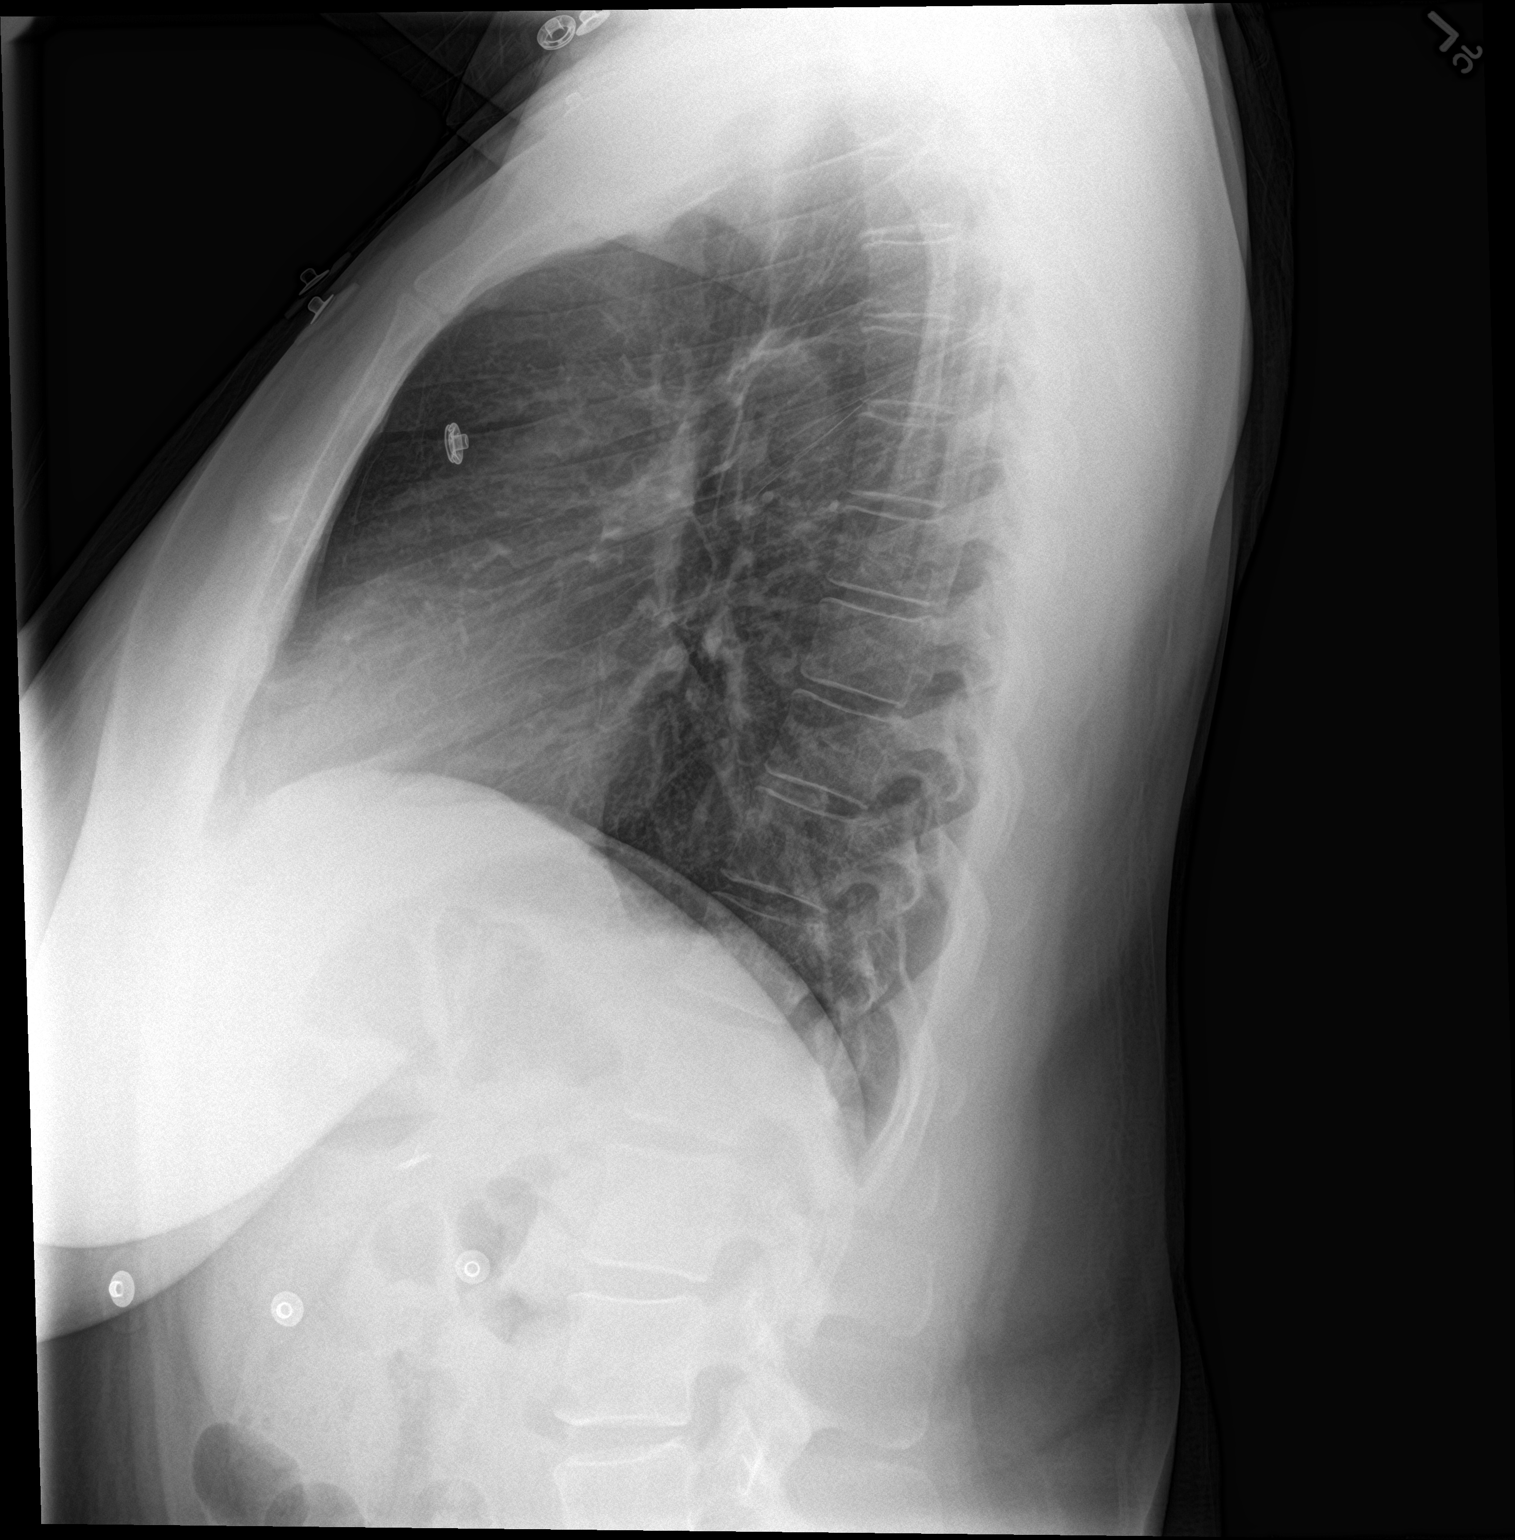

[2 of 2 positions shown; findings below may reference images not displayed]

FINDINGS: The heart size and mediastinal contours are within normal limits.
Both lungs are clear. The visualized skeletal structures are
unremarkable.
IMPRESSION: No active cardiopulmonary disease.

## 2021-06-21 ENCOUNTER — Encounter (HOSPITAL_BASED_OUTPATIENT_CLINIC_OR_DEPARTMENT_OTHER): Payer: Self-pay | Admitting: Obstetrics & Gynecology

## 2021-06-23 ENCOUNTER — Encounter (HOSPITAL_BASED_OUTPATIENT_CLINIC_OR_DEPARTMENT_OTHER): Payer: Managed Care, Other (non HMO) | Admitting: Obstetrics & Gynecology

## 2021-06-30 ENCOUNTER — Inpatient Hospital Stay (HOSPITAL_COMMUNITY): Payer: Managed Care, Other (non HMO) | Admitting: Anesthesiology

## 2021-06-30 ENCOUNTER — Encounter (HOSPITAL_COMMUNITY): Payer: Self-pay

## 2021-06-30 ENCOUNTER — Other Ambulatory Visit: Payer: Self-pay

## 2021-06-30 ENCOUNTER — Inpatient Hospital Stay (HOSPITAL_COMMUNITY)
Admission: AD | Admit: 2021-06-30 | Discharge: 2021-07-02 | DRG: 806 | Disposition: A | Discharge status: Home / Self Care | Payer: Managed Care, Other (non HMO) | Attending: Obstetrics & Gynecology | Admitting: Obstetrics & Gynecology

## 2021-06-30 DIAGNOSIS — D62 Acute posthemorrhagic anemia: Secondary | ICD-10-CM | POA: Diagnosis not present

## 2021-06-30 DIAGNOSIS — Z3A37 37 weeks gestation of pregnancy: Secondary | ICD-10-CM | POA: Diagnosis not present

## 2021-06-30 DIAGNOSIS — O4202 Full-term premature rupture of membranes, onset of labor within 24 hours of rupture: Secondary | ICD-10-CM

## 2021-06-30 DIAGNOSIS — O99824 Streptococcus B carrier state complicating childbirth: Secondary | ICD-10-CM | POA: Diagnosis present

## 2021-06-30 DIAGNOSIS — O9081 Anemia of the puerperium: Secondary | ICD-10-CM | POA: Diagnosis not present

## 2021-06-30 DIAGNOSIS — O4292 Full-term premature rupture of membranes, unspecified as to length of time between rupture and onset of labor: Principal | ICD-10-CM | POA: Diagnosis present

## 2021-06-30 DIAGNOSIS — O4290 Premature rupture of membranes, unspecified as to length of time between rupture and onset of labor, unspecified weeks of gestation: Secondary | ICD-10-CM | POA: Diagnosis present

## 2021-06-30 LAB — CBC
HCT: 25.8 % — ABNORMAL LOW (ref 36.0–46.0)
Hemoglobin: 7.8 g/dL — ABNORMAL LOW (ref 12.0–15.0)
MCH: 26.8 pg (ref 26.0–34.0)
MCHC: 30.2 g/dL (ref 30.0–36.0)
MCV: 88.7 fL (ref 80.0–100.0)
Platelets: 312 10*3/uL (ref 150–400)
RBC: 2.91 MIL/uL — ABNORMAL LOW (ref 3.87–5.11)
RDW: 14.1 % (ref 11.5–15.5)
WBC: 6.2 10*3/uL (ref 4.0–10.5)
nRBC: 0 % (ref 0.0–0.2)

## 2021-06-30 LAB — RPR: RPR Ser Ql: NONREACTIVE

## 2021-06-30 LAB — POCT FERN TEST: POCT Fern Test: POSITIVE

## 2021-06-30 LAB — ABO/RH: ABO/RH(D): A POS

## 2021-06-30 LAB — PREPARE RBC (CROSSMATCH)

## 2021-06-30 MED ORDER — FERROUS SULFATE 325 (65 FE) MG PO TABS
325.0000 mg | ORAL_TABLET | Freq: Every day | ORAL | Status: DC
  Administered 2021-07-01: 325 mg via ORAL
  Filled 2021-06-30: qty 1

## 2021-06-30 MED ORDER — FENTANYL-BUPIVACAINE-NACL 0.5-0.125-0.9 MG/250ML-% EP SOLN
12.0000 mL/h | EPIDURAL | Status: DC | PRN
  Administered 2021-06-30: 12 mL/h via EPIDURAL
  Filled 2021-06-30: qty 250

## 2021-06-30 MED ORDER — ONDANSETRON HCL 4 MG/2ML IJ SOLN: 4.0000 mg | Freq: Four times a day (QID) | INTRAMUSCULAR | Status: DC | PRN

## 2021-06-30 MED ORDER — SENNOSIDES-DOCUSATE SODIUM 8.6-50 MG PO TABS
2.0000 | ORAL_TABLET | Freq: Every day | ORAL | Status: DC
  Administered 2021-07-01 – 2021-07-02 (×2): 2 via ORAL
  Filled 2021-06-30 (×2): qty 2

## 2021-06-30 MED ORDER — LACTATED RINGERS IV SOLN: INTRAVENOUS | Status: DC

## 2021-06-30 MED ORDER — TERBUTALINE SULFATE 1 MG/ML IJ SOLN: 0.2500 mg | Freq: Once | INTRAMUSCULAR | Status: DC | PRN

## 2021-06-30 MED ORDER — SIMETHICONE 80 MG PO CHEW: 80.0000 mg | CHEWABLE_TABLET | ORAL | Status: DC | PRN

## 2021-06-30 MED ORDER — OXYCODONE-ACETAMINOPHEN 5-325 MG PO TABS: 1.0000 | ORAL_TABLET | ORAL | Status: DC | PRN

## 2021-06-30 MED ORDER — DIPHENHYDRAMINE HCL 25 MG PO CAPS: 25.0000 mg | ORAL_CAPSULE | Freq: Four times a day (QID) | ORAL | Status: DC | PRN

## 2021-06-30 MED ORDER — ZOLPIDEM TARTRATE 5 MG PO TABS: 5.0000 mg | ORAL_TABLET | Freq: Every evening | ORAL | Status: DC | PRN

## 2021-06-30 MED ORDER — COCONUT OIL OIL: 1.0000 "application " | TOPICAL_OIL | Status: DC | PRN

## 2021-06-30 MED ORDER — OXYTOCIN-SODIUM CHLORIDE 30-0.9 UT/500ML-% IV SOLN
1.0000 m[IU]/min | INTRAVENOUS | Status: DC
  Administered 2021-06-30: 2 m[IU]/min via INTRAVENOUS

## 2021-06-30 MED ORDER — PRENATAL MULTIVITAMIN CH
1.0000 | ORAL_TABLET | Freq: Every day | ORAL | Status: DC
  Administered 2021-07-01 – 2021-07-02 (×2): 1 via ORAL
  Filled 2021-06-30 (×2): qty 1

## 2021-06-30 MED ORDER — PHENYLEPHRINE 40 MCG/ML (10ML) SYRINGE FOR IV PUSH (FOR BLOOD PRESSURE SUPPORT): 80.0000 ug | PREFILLED_SYRINGE | INTRAVENOUS | Status: DC | PRN

## 2021-06-30 MED ORDER — LIDOCAINE HCL (PF) 1 % IJ SOLN: 30.0000 mL | INTRAMUSCULAR | Status: DC | PRN

## 2021-06-30 MED ORDER — BENZOCAINE-MENTHOL 20-0.5 % EX AERO: 1.0000 "application " | INHALATION_SPRAY | CUTANEOUS | Status: DC | PRN

## 2021-06-30 MED ORDER — ACETAMINOPHEN 325 MG PO TABS: 650.0000 mg | ORAL_TABLET | ORAL | Status: DC | PRN

## 2021-06-30 MED ORDER — DIBUCAINE (PERIANAL) 1 % EX OINT: 1.0000 "application " | TOPICAL_OINTMENT | CUTANEOUS | Status: DC | PRN

## 2021-06-30 MED ORDER — OXYTOCIN-SODIUM CHLORIDE 30-0.9 UT/500ML-% IV SOLN
2.5000 [IU]/h | INTRAVENOUS | Status: DC
  Filled 2021-06-30: qty 500

## 2021-06-30 MED ORDER — LACTATED RINGERS IV SOLN
500.0000 mL | Freq: Once | INTRAVENOUS | Status: AC
  Administered 2021-06-30: 500 mL via INTRAVENOUS

## 2021-06-30 MED ORDER — OXYTOCIN BOLUS FROM INFUSION
333.0000 mL | Freq: Once | INTRAVENOUS | Status: AC
  Administered 2021-06-30: 333 mL via INTRAVENOUS

## 2021-06-30 MED ORDER — EPHEDRINE 5 MG/ML INJ: 10.0000 mg | INTRAVENOUS | Status: DC | PRN

## 2021-06-30 MED ORDER — TETANUS-DIPHTH-ACELL PERTUSSIS 5-2.5-18.5 LF-MCG/0.5 IM SUSY: 0.5000 mL | PREFILLED_SYRINGE | Freq: Once | INTRAMUSCULAR | Status: DC

## 2021-06-30 MED ORDER — FENTANYL CITRATE (PF) 100 MCG/2ML IJ SOLN: 50.0000 ug | INTRAMUSCULAR | Status: DC | PRN

## 2021-06-30 MED ORDER — SOD CITRATE-CITRIC ACID 500-334 MG/5ML PO SOLN: 30.0000 mL | ORAL | Status: DC | PRN

## 2021-06-30 MED ORDER — LACTATED RINGERS IV SOLN: 500.0000 mL | INTRAVENOUS | Status: DC | PRN

## 2021-06-30 MED ORDER — IBUPROFEN 600 MG PO TABS
600.0000 mg | ORAL_TABLET | Freq: Four times a day (QID) | ORAL | Status: DC
  Administered 2021-06-30 – 2021-07-02 (×7): 600 mg via ORAL
  Filled 2021-06-30 (×7): qty 1

## 2021-06-30 MED ORDER — LIDOCAINE HCL (PF) 1 % IJ SOLN
INTRAMUSCULAR | Status: DC | PRN
  Administered 2021-06-30: 10 mL via EPIDURAL

## 2021-06-30 MED ORDER — WITCH HAZEL-GLYCERIN EX PADS: 1.0000 "application " | MEDICATED_PAD | CUTANEOUS | Status: DC | PRN

## 2021-06-30 MED ORDER — PENICILLIN G POT IN DEXTROSE 60000 UNIT/ML IV SOLN
3.0000 10*6.[IU] | INTRAVENOUS | Status: DC
  Administered 2021-06-30: 3 10*6.[IU] via INTRAVENOUS
  Filled 2021-06-30: qty 50

## 2021-06-30 MED ORDER — ONDANSETRON HCL 4 MG/2ML IJ SOLN: 4.0000 mg | INTRAMUSCULAR | Status: DC | PRN

## 2021-06-30 MED ORDER — OXYCODONE-ACETAMINOPHEN 5-325 MG PO TABS: 2.0000 | ORAL_TABLET | ORAL | Status: DC | PRN

## 2021-06-30 MED ORDER — ONDANSETRON HCL 4 MG PO TABS: 4.0000 mg | ORAL_TABLET | ORAL | Status: DC | PRN

## 2021-06-30 MED ORDER — ACETAMINOPHEN 325 MG PO TABS
650.0000 mg | ORAL_TABLET | ORAL | Status: DC | PRN
Start: 2021-06-30 — End: 2021-07-02
  Administered 2021-07-01: 650 mg via ORAL
  Filled 2021-06-30: qty 2

## 2021-06-30 MED ORDER — DIPHENHYDRAMINE HCL 50 MG/ML IJ SOLN: 12.5000 mg | INTRAMUSCULAR | Status: DC | PRN

## 2021-06-30 MED ORDER — SODIUM CHLORIDE 0.9 % IV SOLN
5.0000 10*6.[IU] | Freq: Once | INTRAVENOUS | Status: AC
  Administered 2021-06-30: 5 10*6.[IU] via INTRAVENOUS
  Filled 2021-06-30: qty 5

## 2021-06-30 NOTE — MAU Note (Signed)
.  Linda Fuller is a 31 y.o. at [redacted]w[redacted]d here in MAU reporting: SROM this morning at 0700, clear fluid. Denies VB. Endorses good fetal movement. Also reports ctx every 10 minutes.  ? ?Pain score: 2 ? ?FHT:139 ? ?

## 2021-06-30 NOTE — Progress Notes (Signed)
Linda Fuller is a 31 y.o. [phone] at [redacted]w[redacted]d by ultrasound admitted for rupture of membranes ? ?Subjective: ?Comfortable with Linda Fuller ? ?Objective: ?BP (!) 110/45   Pulse 74   Temp 98.1 ?F (36.7 ?C) (Oral)   Resp 16   Ht 5\' 8"  (1.727 m)   Wt 89.4 kg   LMP 10/14/2020 (Exact Date)   SpO2 100%   BMI 29.95 kg/m?  ?No intake/output data recorded. ?No intake/output data recorded. ? ?FHT:  FHR: 135 bpm, variability: moderate,  accelerations:  Present,  decelerations:  Absent ?UC:   irregular, every 2-4 minutes ?SVE:   Dilation: 4 ?Effacement (%): 70 ?Station: -1 ?Exam by:: Linda Ridgel RN ? ?Labs: ?Lab Results  ?Component Value Date  ? WBC 6.2 06/30/2021  ? HGB 7.8 (L) 06/30/2021  ? HCT 25.8 (L) 06/30/2021  ? MCV 88.7 06/30/2021  ? PLT 312 06/30/2021  ? ? ?Assessment / Plan: ?Augmentation of labor, progressing well ? ?Labor:  Will start pitocin since no cervical change ?Preeclampsia:   n/a ?Fetal Wellbeing:  Category I ?Pain Control:  Epidural ?I/D:  n/a ?Anticipated MOD:  NSVD ? ?Linda Fuller ?06/30/2021, 5:21 PM ? ? ?

## 2021-06-30 NOTE — H&P (Addendum)
Linda Fuller is a 31 y.o. female [phone] at [redacted]w[redacted]d presenting for PROM at 0700 today.  Clear fluid.  Active FM.  Mild CTX.  Antepartum course complicated by anemia.  Admission Hgb 7.8.  Patient has h/o HSV + serology; no h/o outbreak and patient declined Valtrex prophylaxis.  She denies lesions and prodromal symptoms.  Patient has h/o anxiety and depression; stable without medication.  GBS positive. ? ?OB History   ? ? Gravida  ?4  ? Para  ?1  ? Term  ?1  ? Preterm  ?0  ? AB  ?2  ? Living  ?1  ?  ? ? SAB  ?1  ? IAB  ?1  ? Ectopic  ?0  ? Multiple  ?0  ? Live Births  ?1  ?   ?  ?  ? ?Past Medical History:  ?Diagnosis Date  ? Anemia   ? ANXIETY 01/27/2010  ? Chronic constipation 01/22/2013  ? Depression   ? GERD (gastroesophageal reflux disease)   ? IBS (irritable bowel syndrome)   ? 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 Obesity 09/10/2014  ? Palpitation   ? STD (sexually transmitted disease)   ? HSV2  ? ?Past Surgical History:  ?Procedure Laterality Date  ? CHOLECYSTECTOMY    ? THERAPEUTIC ABORTION    ? elective  ? ?Family History: family history includes Diabetes in her maternal grandmother and mother; Diabetes type I in her maternal aunt; Heart attack in her maternal aunt; Heart disease in her maternal aunt, maternal grandfather, and mother; Hypertension in her father, maternal aunt, maternal grandfather, maternal grandmother, and mother; Stroke in her maternal grandfather and maternal grandmother. ?Social History:  reports that she has never smoked. She has never used smokeless tobacco. She reports that she does not currently use alcohol. She reports that she does not currently use drugs after having used the following drugs: Marijuana. ? ? ?  ?Maternal Diabetes: No ?Genetic Screening: Normal ?Maternal Ultrasounds/Referrals: Normal ?Fetal Ultrasounds or other Referrals:  None ?Maternal Substance Abuse:  No ?Significant Maternal Medications:  None ?Significant Maternal Lab Results:  Group B Strep positive ?Other Comments:   None ? ?Review of Systems ?Maternal Medical History:  ?Reason for admission: Rupture of membranes.  ? ?Contractions: Onset was 1-2 hours ago.   ?Frequency: irregular.   ?Perceived severity is mild.   ?Fetal activity: Perceived fetal activity is normal.   ?Last perceived fetal movement was within the past hour.   ?Prenatal complications: no prenatal complications ?Prenatal Complications - Diabetes: none. ? ?Dilation: 4 ?Effacement (%): 70 ?Station: -2 ?Exam by:: Dr. Langston Masker ?Blood pressure 129/81, pulse 78, temperature 98.1 ?F (36.7 ?C), temperature source Oral, resp. rate 18, height 5\' 8"  (1.727 m), weight 89.4 kg, last menstrual period 10/14/2020, SpO2 100 %, unknown if currently breastfeeding. ?Maternal Exam:  ?Uterine Assessment: Contraction strength is mild.  Contraction frequency is irregular.  ?Abdomen: Patient reports no abdominal tenderness. Fundal height is c/w dates.   ?Estimated fetal weight is 6#8.   ?Fetal presentation: vertex ?Introitus: Normal vulva. Vagina is negative for ulcerations.  ?Amniotic fluid character: clear. ?Pelvis: adequate for delivery.   ?Cervix: Cervix evaluated by digital exam.   ? ? ?Fetal Exam ?Fetal Monitor Review: Baseline rate: 130.  ?Variability: moderate (6-25 bpm).   ?Pattern: accelerations present and no decelerations.   ?Fetal State Assessment: Category I - tracings are normal. ? ?Physical Exam ?Constitutional:   ?   Appearance: Normal appearance.  ?HENT:  ?   Head:  Normocephalic and atraumatic.  ?Pulmonary:  ?   Effort: Pulmonary effort is normal.  ?Abdominal:  ?   Palpations: Abdomen is soft.  ?Genitourinary: ?   General: Normal vulva.  ?Musculoskeletal:     ?   General: Normal range of motion.  ?   Cervical back: Normal range of motion.  ?Skin: ?   General: Skin is warm and dry.  ?Neurological:  ?   Mental Status: She is alert and oriented to person, place, and time.  ?Psychiatric:     ?   Mood and Affect: Mood normal.     ?   Behavior: Behavior normal.  ?  ?Prenatal  labs: ?ABO, Rh: --/--/A POS (04/05 1021) ?Antibody: NEG (04/05 1021) ?Rubella: 2.52 (09/28 1529) ?RPR: Non Reactive (09/28 1529)  ?HBsAg: Negative (09/28 1529)  ?HIV: Non Reactive (09/28 1529)  ?GBS: Positive/-- (09/28 0000)  ? ?Assessment/Plan: ?20NO [id number] at 37weeks with PROM ?-CLEA when desired ?-Augment prn ?-Anticipate NSVD ?-Anemia-Crossmatched for 2u PRBCs ?-GBS pos-PCN  ? ?Aundra Millet Lavar Rosenzweig ?06/30/2021, 12:59 PM ? ? ? ? ?

## 2021-06-30 NOTE — Lactation Note (Signed)
This note was copied from a baby's chart. ?Lactation Consultation Note ? ?Patient Name: Boy Carter Kaman ?Today's Date: 06/30/2021 ?Reason for consult: L&D Initial assessment;Early term 37-38.6wks;Other (Comment) (Breast tissue issues) ?Age:32 hours ? ?L&D consult with >60 minutes old infant and P2 mother. Congratulated family on newborn.  ? ?Mother reports breast tissue issues and bleeding when breastfeeding her first child. Mother declines latching assistance and changed her preference to formula feeding.  ? ?Discussed STS as ideal transition for infants after birth. Talked about primal reflexes. Reinforced breast care and engorgement relief, if experienced. Encouraged to contact LC as needed. Thanked family for their time.   ? ? ? ?Maternal Data ?Has patient been taught Hand Expression?: No ?Does the patient have breastfeeding experience prior to this delivery?: Yes ?How long did the patient breastfeed?: a few days after delivery ? ?Feeding ?Mother's Current Feeding Choice: Formula ? ?Interventions ?Interventions: Education ? ?Discharge ?Discharge Education: Engorgement and breast care ? ?Consult Status ?Consult Status: PRN ? ? ? ?Margarine Grosshans A Higuera Ancidey ?06/30/2021, 8:58 PM ? ? ? ?

## 2021-06-30 NOTE — Anesthesia Preprocedure Evaluation (Signed)
Anesthesia Evaluation  ?Patient identified by MRN, date of birth, ID band ?Patient awake ? ? ? ?Reviewed: ?Allergy & Precautions, H&P , Patient's Chart, lab work & pertinent test results ? ?Airway ?Mallampati: I ? ? ? ? ? ? Dental ?no notable dental hx. ? ?  ?Pulmonary ?neg pulmonary ROS,  ?  ?Pulmonary exam normal ?breath sounds clear to auscultation ? ? ? ? ? ? Cardiovascular ?negative cardio ROS ?Normal cardiovascular exam ?Rhythm:regular Rate:Normal ? ? ?  ?Neuro/Psych ?  ? GI/Hepatic ?Neg liver ROS,   ?Endo/Other  ?negative endocrine ROS ? Renal/GU ?negative Renal ROS  ?negative genitourinary ?  ?Musculoskeletal ? ? Abdominal ?Normal abdominal exam  (+)   ?Peds ? Hematology ?  ?Anesthesia Other Findings ? ? Reproductive/Obstetrics ?(+) Pregnancy ? ?  ? ? ? ? ? ? ? ? ? ? ? ? ? ?  ?  ? ? ? ? ? ? ? ? ?Anesthesia Physical ?Anesthesia Plan ? ?ASA: 2 ? ?Anesthesia Plan: Epidural  ? ?Post-op Pain Management:   ? ?Induction:  ? ?PONV Risk Score and Plan:  ? ?Airway Management Planned:  ? ?Additional Equipment:  ? ?Intra-op Plan:  ? ?Post-operative Plan:  ? ?Informed Consent: I have reviewed the patients History and Physical, chart, labs and discussed the procedure including the risks, benefits and alternatives for the proposed anesthesia with the patient or authorized representative who has indicated his/her understanding and acceptance.  ? ? ? ? ? ?Plan Discussed with:  ? ?Anesthesia Plan Comments:   ? ? ? ? ? ? ?Anesthesia Quick Evaluation ? ?

## 2021-06-30 NOTE — Anesthesia Procedure Notes (Signed)
Epidural ?Patient location during procedure: OB ?Start time: 06/30/2021 2:33 PM ?End time: 06/30/2021 2:36 PM ? ?Staffing ?Anesthesiologist: Lyn Hollingshead, MD ?Performed: anesthesiologist  ? ?Preanesthetic Checklist ?Completed: patient identified, IV checked, site marked, risks and benefits discussed, surgical consent, monitors and equipment checked, pre-op evaluation and timeout performed ? ?Epidural ?Patient position: sitting ?Prep: DuraPrep and site prepped and draped ?Patient monitoring: continuous pulse ox and blood pressure ?Approach: midline ?Location: L3-L4 ?Injection technique: LOR air ? ?Needle:  ?Needle type: Tuohy  ?Needle gauge: 17 G ?Needle length: 9 cm and 9 ?Needle insertion depth: 5 cm ?Catheter type: closed end flexible ?Catheter size: 19 Gauge ?Catheter at skin depth: 10 cm ?Test dose: negative and Other ? ?Assessment ?Events: blood not aspirated, injection not painful, no injection resistance, no paresthesia and negative IV test ? ?Additional Notes ?Reason for block:procedure for pain ? ? ? ?

## 2021-06-30 NOTE — MAU Provider Note (Signed)
S: Ms. Linda Fuller is a 31 y.o. [phone] at [redacted]w[redacted]d  who presents to MAU today complaining of leaking of fluid since 0700. She denies vaginal bleeding. She endorses rectal pressure every 10 mins/?contractions. She reports normal fetal movement.   ? ?O: BP 129/84   Pulse 89   Resp 18   LMP 10/14/2020 (Exact Date)   SpO2 100%  ?GENERAL: Well-developed, well-nourished female in no acute distress.  ?HEAD: Normocephalic, atraumatic.  ?CHEST: Normal effort of breathing, regular heart rate ?ABDOMEN: Soft, nontender, gravid ?PELVIC: Normal external female genitalia - no lesions seen. Vagina is pink and rugated. Cervix with normal contour, no lesions. Normal discharge.  Positive minimal pooling of watery clearish-white fluid.  ? ?Cervical exam:  ?Dilation: 1.5 ?Effacement (%): 60 ?Station: -1 ?Presentation: Vertex ?Exam by:: Raelyn Mora, CNM ? ? ?Fetal Monitoring: ?Baseline: 135 ?Variability: moderate ?Accelerations: present ?Decelerations: absent ?Contractions: Occ UC's with UI noted ? ?**Chart Review from 2013 - No HSV dx -- dx of folliculitis. No HSV testing seen in prior or current records. ? ?Results for orders placed or performed during the hospital encounter of 06/30/21 (from the past 24 hour(s))  ?POCT fern test     Status: None  ? Collection Time: 06/30/21 10:15 AM  ?Result Value Ref Range  ? POCT Fern Test Positive = ruptured amniotic membanes   ? ? ?A: ?[facility] at [redacted]w[redacted]d  ?SROM ? ?P: ?Report given to RN to contact MD on call for further instructions ? ?Raelyn Mora, CNM ?06/30/2021, 10:12 AM ? ?

## 2021-07-01 ENCOUNTER — Encounter (HOSPITAL_BASED_OUTPATIENT_CLINIC_OR_DEPARTMENT_OTHER): Payer: Managed Care, Other (non HMO) | Admitting: Obstetrics & Gynecology

## 2021-07-01 LAB — CBC
HCT: 20.5 % — ABNORMAL LOW (ref 36.0–46.0)
Hemoglobin: 6.5 g/dL — CL (ref 12.0–15.0)
MCH: 27.3 pg (ref 26.0–34.0)
MCHC: 31.7 g/dL (ref 30.0–36.0)
MCV: 86.1 fL (ref 80.0–100.0)
Platelets: 226 10*3/uL (ref 150–400)
RBC: 2.38 MIL/uL — ABNORMAL LOW (ref 3.87–5.11)
RDW: 14.2 % (ref 11.5–15.5)
WBC: 6.7 10*3/uL (ref 4.0–10.5)
nRBC: 0 % (ref 0.0–0.2)

## 2021-07-01 MED ORDER — SUCROSE 24% NICU/PEDS ORAL SOLUTION: 0.5000 mL | OROMUCOSAL | Status: DC | PRN

## 2021-07-01 MED ORDER — ACETAMINOPHEN FOR CIRCUMCISION 160 MG/5 ML
40.0000 mg | ORAL | Status: DC | PRN
  Filled 2021-07-01: qty 1.25

## 2021-07-01 MED ORDER — SODIUM CHLORIDE 0.9 % IV SOLN
500.0000 mg | Freq: Once | INTRAVENOUS | Status: AC
  Administered 2021-07-01: 500 mg via INTRAVENOUS
  Filled 2021-07-01: qty 25

## 2021-07-01 MED ORDER — FERROUS SULFATE 325 (65 FE) MG PO TABS: 325.0000 mg | ORAL_TABLET | ORAL | Status: DC

## 2021-07-01 MED ORDER — ACETAMINOPHEN FOR CIRCUMCISION 160 MG/5 ML
40.0000 mg | Freq: Once | ORAL | Status: DC
  Filled 2021-07-01: qty 1.25

## 2021-07-01 MED ORDER — WHITE PETROLATUM EX OINT: 1.0000 "application " | TOPICAL_OINTMENT | CUTANEOUS | Status: DC | PRN

## 2021-07-01 MED ORDER — SODIUM CHLORIDE 0.9 % IV SOLN: 200.0000 mg | Freq: Once | INTRAVENOUS | Status: DC

## 2021-07-01 MED ORDER — LIDOCAINE 1% INJECTION FOR CIRCUMCISION
0.8000 mL | INJECTION | Freq: Once | INTRAVENOUS | Status: DC
  Filled 2021-07-01: qty 1

## 2021-07-01 MED ORDER — EPINEPHRINE TOPICAL FOR CIRCUMCISION 0.1 MG/ML
1.0000 [drp] | TOPICAL | Status: DC | PRN
  Filled 2021-07-01: qty 1

## 2021-07-01 NOTE — Social Work (Addendum)
CSW received consult for hx of Anxiety and Depression.  CSW met with MOB to offer support and complete assessment.   ? ?CSW met with MOB at bedside and introduced CSW role. CSW observed MOB sitting up in the bed, FOB present at bedside, infant out for his circumcision procedure and maternal grandmother present. MOB gave CSW permission to complete the assessment with family present. MOB presented pleasant and engaged with CSW. CSW inquired how MOB has felt since giving birth. MOB reported feeling great. MOB shared the delivery went well. CSW inquired how MOB felt during the pregnancy. MOB reported she felt okay and did not have concerns with anxiety. MOB acknowledged she has a history of anxiety and reported no depression. MOB reported she was diagnosed with anxiety at age 31. MOB reported no history of medication or therapy and is not interested. MOB reported that she copes through prayer which works well for her. CSW discussed PPD. CSW provided education regarding the baby blues period vs. perinatal mood disorders, discussed treatment and gave resources for mental health follow up if concerns arise.  CSW recommended MOB complete a self-evaluation during the postpartum time period using the New Mom Checklist from Postpartum Progress and encouraged MOB to contact a medical professional if symptoms are noted at any time. MOB reported she feels comfortable reaching out to her doctor if she has concerns. CSW assessed MOB for safety. MOB denied thoughts of harm to self and others. MOB identified her spouse and mom as supports.  ? ?CSW provided review of Sudden Infant Death Syndrome (SIDS) precautions. MOB reported understanding. MOB reported she has essential items for the infant including a bassinet where the infant will sleep. MOB has chosen [employer] for the infant's follow up care. CSW assessed MOB for additional needs. MOB reported no further need.  ? ?CSW identifies no further need for intervention and  no barriers to discharge at this time.  ? ?Kathrin Greathouse, MSW, LCSW ?[facility]  ?Clinical Social Worker  ?[phone] ?07/01/2021  5:22 PM ?

## 2021-07-01 NOTE — Progress Notes (Signed)
Postpartum Progress Note ? ?Post Partum Day 1 s/p spontaneous vaginal delivery 19:45 06/30/21.  Patient reports well-controlled pain, ambulating without difficulty, voiding spontaneously, tolerating PO.  Vaginal bleeding is appropriate. She feels some fatigue but denies lightheadedness, dizziness, palpitations. Denies SOB, CP, fever, chills.  ? ? ?Objective: ?Blood pressure 116/64, pulse 74, temperature 98.2 ?F (36.8 ?C), temperature source Oral, resp. rate 17, height 5\' 8"  (1.727 m), weight 89.4 kg, last menstrual period 10/14/2020, SpO2 99 %, unknown if currently breastfeeding. ? ?Physical Exam:  ?General: alert and no distress ?Lochia: appropriate ?Uterine Fundus: firm ?DVT Evaluation: No evidence of DVT seen on physical exam. ? ?Recent Labs  ?  06/30/21 ?1021 07/01/21 ?08/31/21  ?HGB 7.8* 6.5*  ?HCT 25.8* 20.5*  ? ? ?Assessment/Plan: ?Postpartum Day 1, s/p vaginal delivery. ?Continue routine postpartum care ?Acute blood loss on chronic anemia- Does not tolerate oral iron well. No significant symptoms of anemia and patient is hoping to avoid blood transfusion. Will order IV Fe ?Lactation following ?Baby boy - desires circ, will perform when cleared by nursery.  ?Anticipate discharge home tomorrow ? ? LOS: 1 day  ? ?2585 ?07/01/2021, 7:20 AM  ? ? ?

## 2021-07-01 NOTE — Anesthesia Postprocedure Evaluation (Signed)
Anesthesia Post Note ? ?Patient: Linda Fuller ? ?Procedure(s) Performed: AN AD HOC LABOR EPIDURAL ? ?  ? ?Patient location during evaluation: Mother Baby ?Anesthesia Type: Epidural ?Level of consciousness: awake ?Pain management: satisfactory to patient ?Vital Signs Assessment: post-procedure vital signs reviewed and stable ?Respiratory status: spontaneous breathing ?Cardiovascular status: stable ?Anesthetic complications: no ? ? ?No notable events documented. ? ?Last Vitals:  ?Vitals:  ? 07/01/21 0211 07/01/21 0609  ?BP: 134/71 116/64  ?Pulse: 84 74  ?Resp: 16 17  ?Temp: 36.6 ?C 36.8 ?C  ?SpO2: 96% 99%  ?  ?Last Pain:  ?Vitals:  ? 07/01/21 0609  ?TempSrc: Oral  ?PainSc: 6   ? ?Pain Goal:   ? ?  ?  ?  ?  ?  ?  ?  ? ?Kazuko Clemence ? ? ? ? ?

## 2021-07-02 NOTE — Discharge Summary (Signed)
? ?  Postpartum Discharge Summary ? ?Date of Service July 02, 2021 ? ?   ?Patient Name: Linda Fuller ?DOB: 04/04/1990 ?MRN: [id number] ? ?Date of admission: 06/30/2021 ?Delivery date:06/30/2021  ?Delivering provider: Linda Hedges  ?Date of discharge: 07/02/2021 ? ?Admitting diagnosis: PROM (premature rupture of membranes) [O42.90] ?Intrauterine pregnancy: [redacted]w[redacted]d    ?Secondary diagnosis:  Principal Problem: ?  PROM (premature rupture of membranes) ? ?Additional problems: none    ?Discharge diagnosis: Term Pregnancy Delivered                                              ?Post partum procedures: iron infusion ?Augmentation: Pitocin ?Complications: None ? ?Hospital course: Onset of Labor With Vaginal Delivery      ?31y.o. yo [id number] 348w0das admitted in Latent Labor on 06/30/2021. Patient had an uncomplicated labor course as follows:  ?Membrane Rupture Time/Date: 7:00 AM ,06/30/2021   ?Delivery Method:Vaginal, Spontaneous  ?Episiotomy: None  ?Lacerations:  None  ?Patient had an uncomplicated postpartum course.  She is ambulating, tolerating a regular diet, passing flatus, and urinating well. Patient is discharged home in stable condition on 07/02/21. ? ?Newborn Data: ?Birth date:06/30/2021  ?Birth time:7:45 PM  ?Gender:Female  ?Living status:Living  ?Apgars:9 ,9  ?Weight:2860 g  ? ?Magnesium Sulfate received: No ?BMZ received: No ?Rhophylac:N/A ?MMR:N/A ?T-DaP:Given prenatally ?Flu: N/A ?Transfusion:No ? ?Physical exam  ?Vitals:  ? 07/01/21 0609 07/01/21 1100 07/01/21 2044 07/02/21 0521  ?BP: 116/64 124/74 117/74 120/71  ?Pulse: 74 84 73 70  ?Resp: _0 ?Temp: 98.2 ?F (36.8 ?C) 98.1 ?F (36.7 ?C) 98.3 ?F (36.8 ?C)   ?TempSrc: Oral Oral Oral   ?SpO2: 99% 100% 100% 100%  ?Weight:      ?Height:      ? ?General: alert, cooperative, and no distress ?Lochia: appropriate ?Uterine Fundus: firm ?Incision: Healing well with no significant drainage ?DVT Evaluation: No evidence of DVT seen on physical exam. ?Labs: ?Lab Results   ?Component Value Date  ? WBC 6.7 07/01/2021  ? HGB 6.5 (LL) 07/01/2021  ? HCT 20.5 (L) 07/01/2021  ? MCV 86.1 07/01/2021  ? PLT 226 07/01/2021  ? ? ?  Latest Ref Rng & Units 12/04/2020  ?  6:04 AM  ?CMP  ?Glucose 70 - 99 mg/dL 94    ?BUN 6 - 20 mg/dL 6    ?Creatinine 0.44 - 1.00 mg/dL 0.59    ?Sodium 135 - 145 mmol/L 136    ?Potassium 3.5 - 5.1 mmol/L 3.3    ?Chloride 98 - 111 mmol/L 105    ?CO2 22 - 32 mmol/L 22    ?Calcium 8.9 - 10.3 mg/dL 9.5    ? ?Edinburgh Score: ? ?  06/30/2021  ? 11:21 PM  ?EdFlavia Shipperostnatal Depression Scale Screening Tool  ?I have been able to laugh and see the funny side of things. 0  ?I have looked forward with enjoyment to things. 0  ?I have blamed myself unnecessarily when things went wrong. 2  ?I have been anxious or worried for no good reason. 1  ?I have felt scared or panicky for no good reason. 1  ?Things have been getting on top of me. 1  ?I have been so unhappy that I have had difficulty sleeping. 1  ?I have felt sad or miserable. 0  ?I have been so unhappy that  I have been crying. 1  ?The thought of harming myself has occurred to me. 0  ?Edinburgh Postnatal Depression Scale Total 7  ? ? ? ? ?After visit meds:  ?Allergies as of 07/02/2021   ? ?   Reactions  ? Norethindrone Rash  ? ?  ? ?  ?Medication List  ?  ? ?STOP taking these medications   ? ?Doxylamine-Pyridoxine 10-10 MG Tbec ?Commonly known as: Diclegis ?  ? ?  ? ?TAKE these medications   ? ?Prenatal 27-1 MG Tabs ?Take 1 tablet by mouth daily. ?  ? ?  ? ? ? ?Discharge home in stable condition ?Infant Feeding: Breast ?Infant Disposition:home with mother ?Discharge instruction: per After Visit Summary and Postpartum booklet. ?Activity: Advance as tolerated. Pelvic rest for 6 weeks.  ?Diet: routine diet ?Anticipated Birth Control: Unsure ?Postpartum Appointment:6 weeks ?Additional Postpartum F/U:  not applicable ?Future Appointments:No future appointments. ?Follow up Visit: ? ? ?  ? ?07/02/2021 ?Cyril Mourning, MD ? ? ?

## 2021-07-04 LAB — BPAM RBC
Blood Product Expiration Date: 202305052359
Blood Product Expiration Date: 202305052359
Unit Type and Rh: 6200
Unit Type and Rh: 6200

## 2021-07-04 LAB — TYPE AND SCREEN
ABO/RH(D): A POS
Antibody Screen: NEGATIVE
Unit division: 0
Unit division: 0

## 2021-07-12 ENCOUNTER — Encounter (HOSPITAL_BASED_OUTPATIENT_CLINIC_OR_DEPARTMENT_OTHER): Payer: Managed Care, Other (non HMO) | Admitting: Obstetrics & Gynecology

## 2021-07-12 ENCOUNTER — Telehealth (HOSPITAL_COMMUNITY): Payer: Self-pay | Admitting: *Deleted

## 2021-07-12 NOTE — Telephone Encounter (Signed)
Mom reports feeling good. No concerns about herself at this time. EPDS= [facility] score=) ?Mom reports baby is doing well. Feeding, peeing, and pooping without difficulty. Safe sleep reviewed. Mom reports no concerns about baby at present. ? ?Duffy Rhody, RN 07-12-2021 at 10:41am ?

## 2021-07-19 ENCOUNTER — Encounter (HOSPITAL_BASED_OUTPATIENT_CLINIC_OR_DEPARTMENT_OTHER): Payer: Managed Care, Other (non HMO) | Admitting: Obstetrics & Gynecology

## 2021-07-26 ENCOUNTER — Encounter (HOSPITAL_BASED_OUTPATIENT_CLINIC_OR_DEPARTMENT_OTHER): Payer: Managed Care, Other (non HMO) | Admitting: Obstetrics & Gynecology

## 2021-10-20 IMAGING — US US OB TRANSVAGINAL
1 series · 15 of 28 positions shown · non-contrast
Comparison: None relevant.

CLINICAL DATA: 30-year-old female with brown discharge. Outpatient
ultrasound last week demonstrated an embryo with cardiac activity.
Estimated gestational age by LMP 7 weeks and 2 days.

EXAM:
TRANSVAGINAL OB ULTRASOUND
TECHNIQUE: Transvaginal ultrasound was performed for complete evaluation of the
gestation as well as the maternal uterus, adnexal regions, and
pelvic cul-de-sac.

[Series 1: us ob transvaginal · 42 acquisitions, 15 frames shown]
[im 1/42]
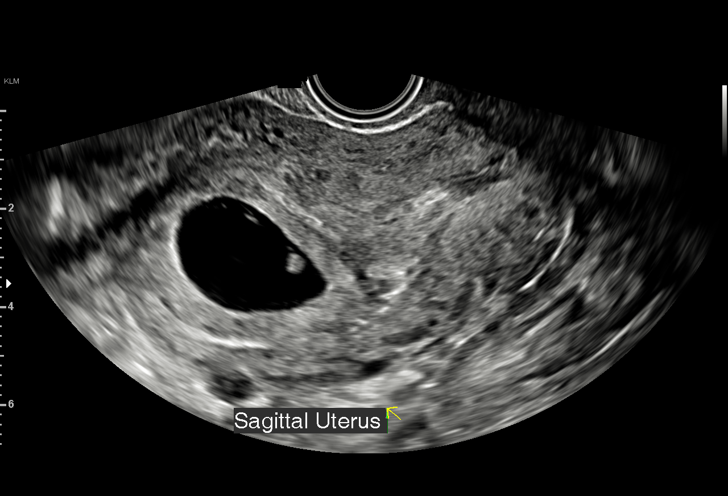
[im 4/42]
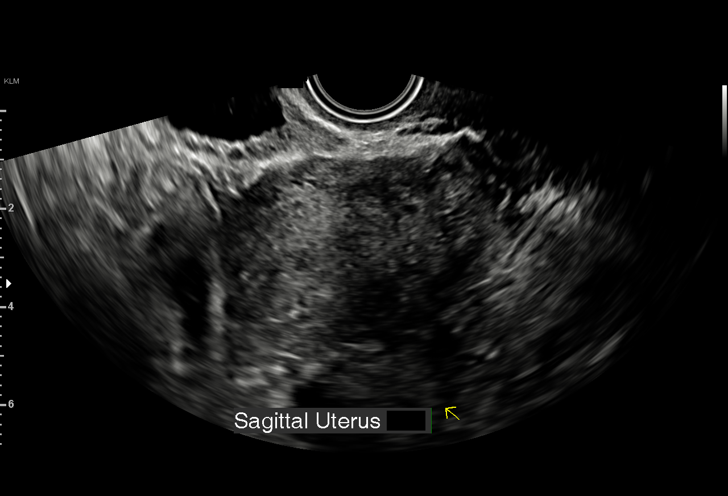
[im 7/42]
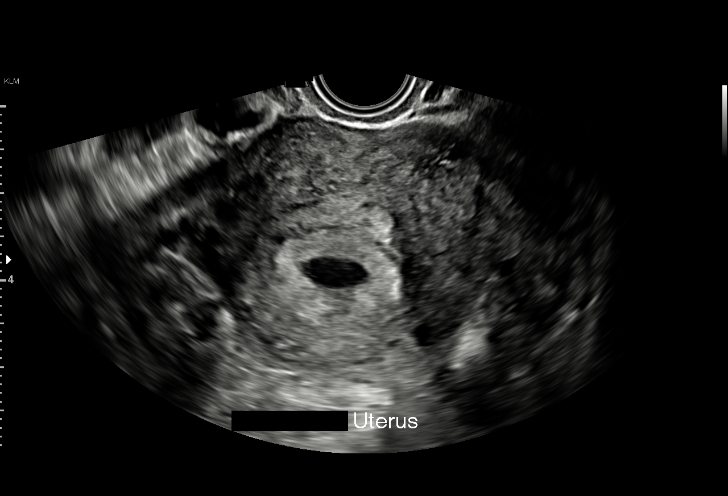
[im 10/42]
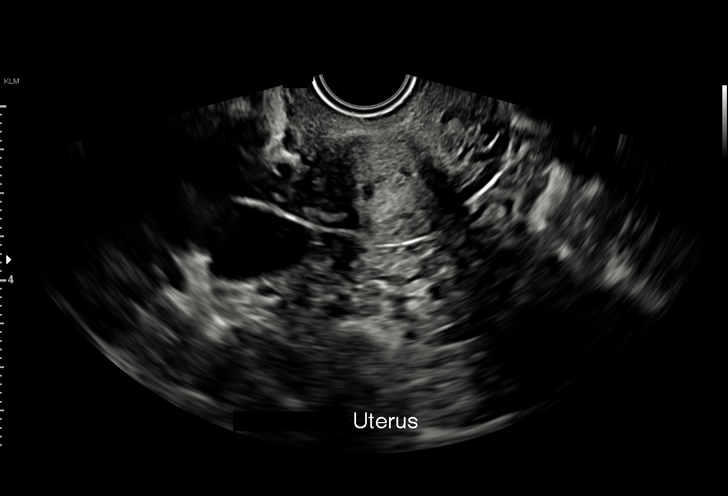
[im 13/42]
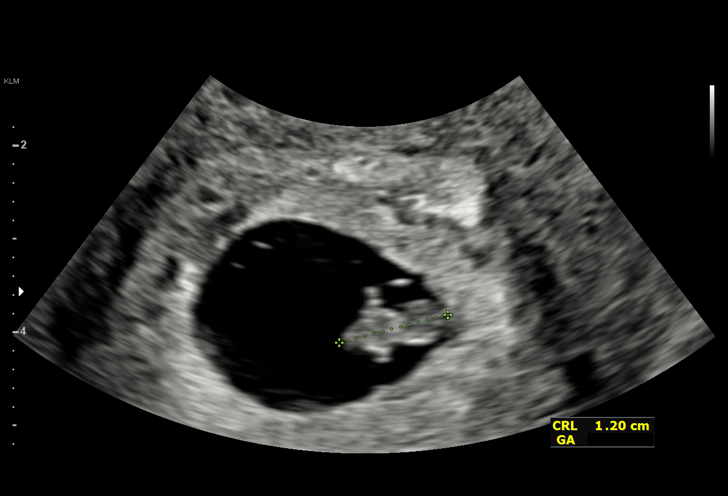
[im 16/42]
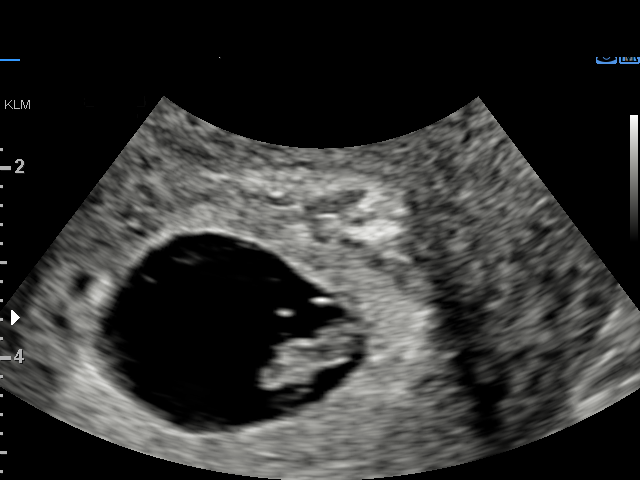
[im 19/42]
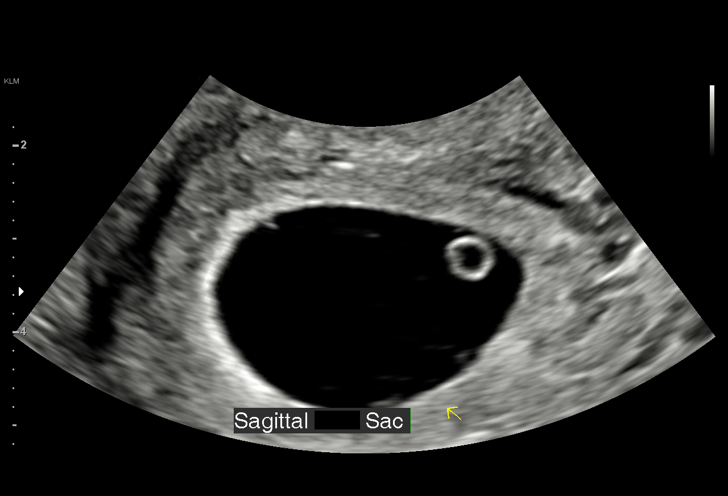
[im 22/42]
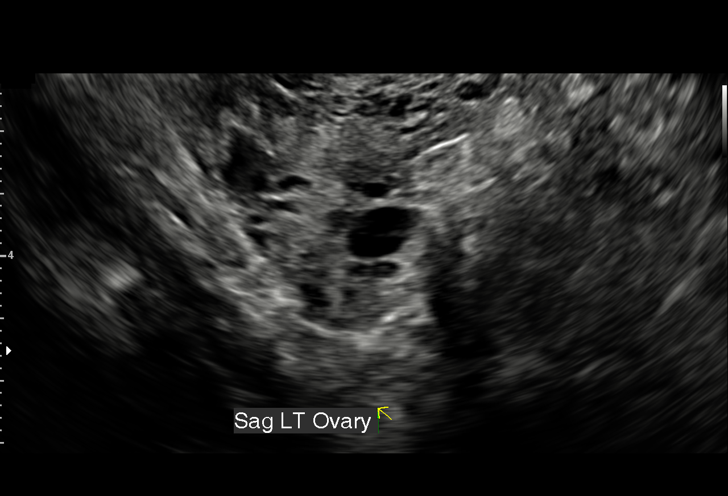
[im 23/42]
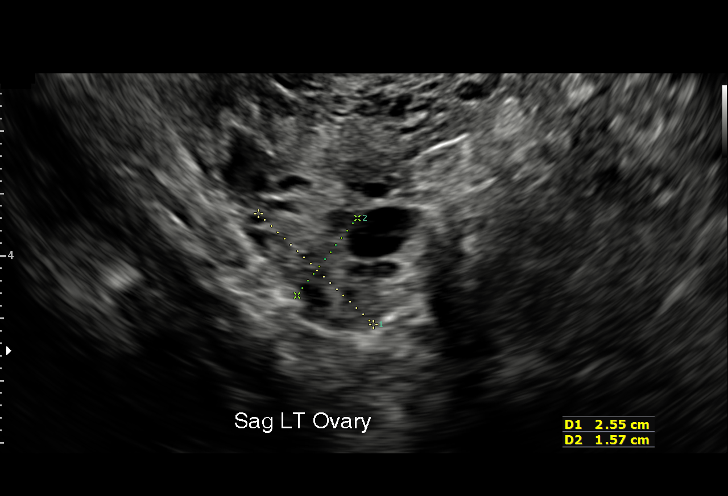
[im 26/42]
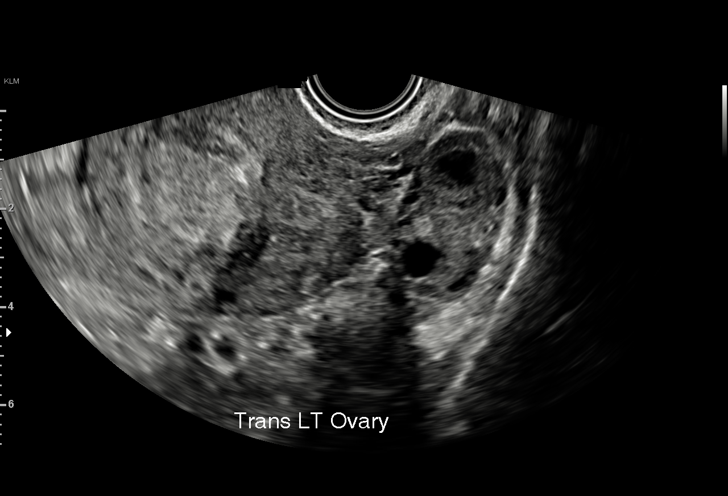
[im 29/42]
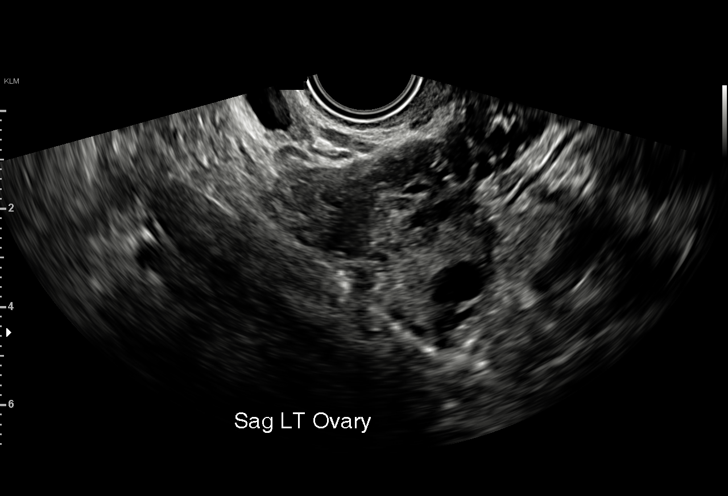
[im 32/42]
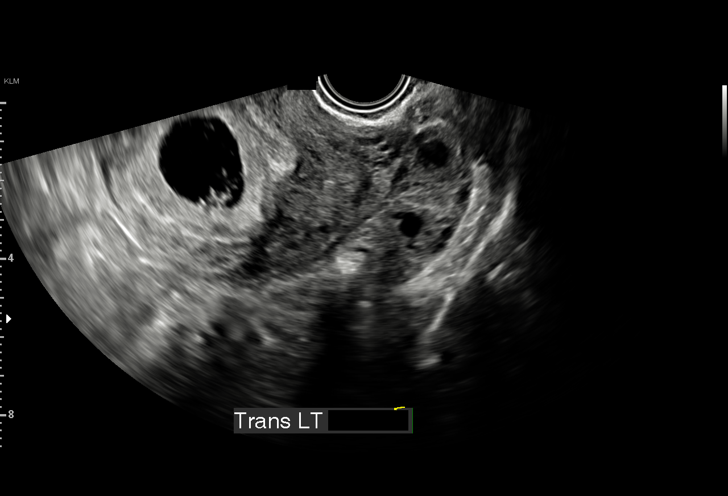
[im 35/42]
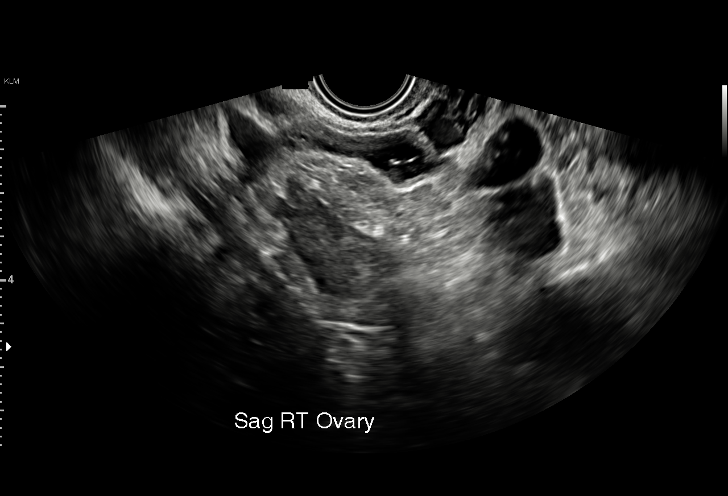
[im 38/42]
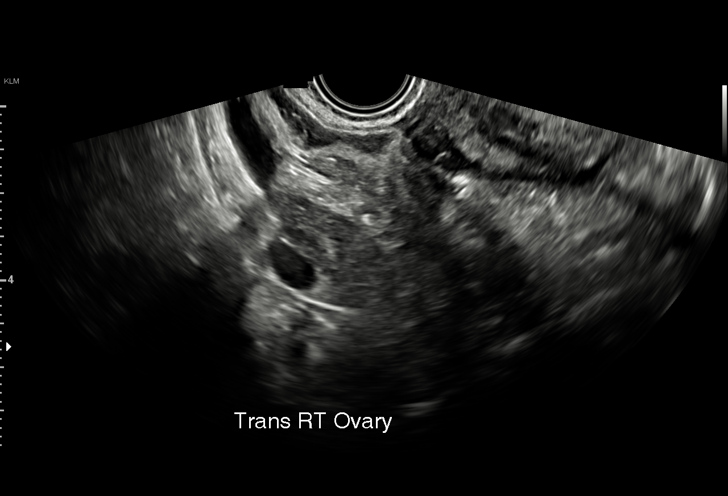
[im 42/42]
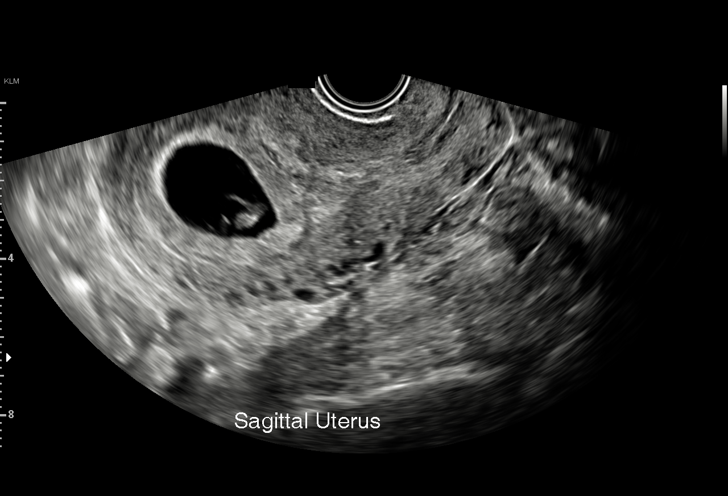

[15 of 28 positions shown; findings below may reference images not displayed]

FINDINGS: Intrauterine gestational sac: Single

Yolk sac:  Visible

Embryo:  Visible

Cardiac Activity: Detected

Heart Rate: 158 bpm

CRL:   11.8 mm   7 w 2 d                  US EDC: 07/21/2021

Subchorionic hemorrhage:  None visualized.

Maternal uterus/adnexae: No pelvic free fluid. The left ovary
appears normal measuring 2.6 x 1.6 x 1.8 cm, with probable corpus
luteum (image 28) and several small follicles. Right ovary appears
normal measuring 3.0 x 1.6 x 2.2 cm.
IMPRESSION: Single living IUP with an estimated gestational age of 7 weeks and 2
days by crown-rump length. No acute maternal findings visualized.

## 2022-11-14 ENCOUNTER — Other Ambulatory Visit: Payer: Self-pay

## 2022-11-14 ENCOUNTER — Emergency Department (HOSPITAL_BASED_OUTPATIENT_CLINIC_OR_DEPARTMENT_OTHER)
Admission: EM | Admit: 2022-11-14 | Discharge: 2022-11-14 | Disposition: A | Discharge status: Home / Self Care | Payer: Managed Care, Other (non HMO) | Attending: Emergency Medicine | Admitting: Emergency Medicine

## 2022-11-14 ENCOUNTER — Encounter (HOSPITAL_BASED_OUTPATIENT_CLINIC_OR_DEPARTMENT_OTHER): Payer: Self-pay | Admitting: Emergency Medicine

## 2022-11-14 DIAGNOSIS — Z1152 Encounter for screening for COVID-19: Secondary | ICD-10-CM | POA: Diagnosis not present

## 2022-11-14 DIAGNOSIS — J0110 Acute frontal sinusitis, unspecified: Secondary | ICD-10-CM

## 2022-11-14 DIAGNOSIS — R197 Diarrhea, unspecified: Secondary | ICD-10-CM | POA: Insufficient documentation

## 2022-11-14 DIAGNOSIS — R002 Palpitations: Secondary | ICD-10-CM | POA: Insufficient documentation

## 2022-11-14 DIAGNOSIS — R42 Dizziness and giddiness: Secondary | ICD-10-CM | POA: Diagnosis not present

## 2022-11-14 DIAGNOSIS — R519 Headache, unspecified: Secondary | ICD-10-CM | POA: Diagnosis not present

## 2022-11-14 DIAGNOSIS — D509 Iron deficiency anemia, unspecified: Secondary | ICD-10-CM

## 2022-11-14 DIAGNOSIS — R0981 Nasal congestion: Secondary | ICD-10-CM | POA: Insufficient documentation

## 2022-11-14 LAB — CBC WITH DIFFERENTIAL/PLATELET
Abs Immature Granulocytes: 0.02 10*3/uL (ref 0.00–0.07)
Basophils Absolute: 0.1 10*3/uL (ref 0.0–0.1)
Basophils Relative: 1 %
Eosinophils Absolute: 0 10*3/uL (ref 0.0–0.5)
Eosinophils Relative: 1 %
HCT: 29.6 % — ABNORMAL LOW (ref 36.0–46.0)
Hemoglobin: 9.4 g/dL — ABNORMAL LOW (ref 12.0–15.0)
Immature Granulocytes: 1 %
Lymphocytes Relative: 38 %
Lymphs Abs: 1.3 10*3/uL (ref 0.7–4.0)
MCH: 25.4 pg — ABNORMAL LOW (ref 26.0–34.0)
MCHC: 31.8 g/dL (ref 30.0–36.0)
MCV: 80 fL (ref 80.0–100.0)
Monocytes Absolute: 0.4 10*3/uL (ref 0.1–1.0)
Monocytes Relative: 10 %
Neutro Abs: 1.7 10*3/uL (ref 1.7–7.7)
Neutrophils Relative %: 49 %
Platelets: 329 10*3/uL (ref 150–400)
RBC: 3.7 MIL/uL — ABNORMAL LOW (ref 3.87–5.11)
RDW: 16.8 % — ABNORMAL HIGH (ref 11.5–15.5)
WBC: 3.5 10*3/uL — ABNORMAL LOW (ref 4.0–10.5)
nRBC: 0 % (ref 0.0–0.2)

## 2022-11-14 LAB — URINALYSIS, ROUTINE W REFLEX MICROSCOPIC
Bilirubin Urine: NEGATIVE
Glucose, UA: NEGATIVE mg/dL
Hgb urine dipstick: NEGATIVE
Ketones, ur: 15 mg/dL — AB
Leukocytes,Ua: NEGATIVE
Nitrite: NEGATIVE
Protein, ur: NEGATIVE mg/dL
Specific Gravity, Urine: 1.014 (ref 1.005–1.030)
pH: 6.5 (ref 5.0–8.0)

## 2022-11-14 LAB — BASIC METABOLIC PANEL
Anion gap: 11 (ref 5–15)
BUN: 8 mg/dL (ref 6–20)
CO2: 23 mmol/L (ref 22–32)
Calcium: 9.5 mg/dL (ref 8.9–10.3)
Chloride: 104 mmol/L (ref 98–111)
Creatinine, Ser: 0.71 mg/dL (ref 0.44–1.00)
GFR, Estimated: >60 mL/min (ref >60)
Glucose, Bld: 95 mg/dL (ref 70–99)
Potassium: 3.3 mmol/L — ABNORMAL LOW (ref 3.5–5.1)
Sodium: 138 mmol/L (ref 135–145)

## 2022-11-14 LAB — RESP PANEL BY RT-PCR (RSV, FLU A&B, COVID)  RVPGX2
Influenza A by PCR: NEGATIVE
Influenza B by PCR: NEGATIVE
Resp Syncytial Virus by PCR: NEGATIVE
SARS Coronavirus 2 by RT PCR: NEGATIVE

## 2022-11-14 LAB — PREGNANCY, URINE: Preg Test, Ur: NEGATIVE

## 2022-11-14 MED ORDER — ONDANSETRON HCL 4 MG/2ML IJ SOLN
4.0000 mg | Freq: Once | INTRAMUSCULAR | Status: AC
  Administered 2022-11-14: 4 mg via INTRAVENOUS
  Filled 2022-11-14: qty 2

## 2022-11-14 MED ORDER — AMOXICILLIN-POT CLAVULANATE 875-125 MG PO TABS
1.0000 | ORAL_TABLET | Freq: Two times a day (BID) | ORAL | 0 refills | Status: DC
Start: 2022-11-14 — End: 2023-01-19

## 2022-11-14 MED ORDER — SODIUM CHLORIDE 0.9 % IV BOLUS
1000.0000 mL | Freq: Once | INTRAVENOUS | Status: AC
  Administered 2022-11-14: 1000 mL via INTRAVENOUS

## 2022-11-14 MED ORDER — ACETAMINOPHEN 325 MG PO TABS
650.0000 mg | ORAL_TABLET | Freq: Once | ORAL | Status: AC
  Administered 2022-11-14: 650 mg via ORAL
  Filled 2022-11-14: qty 2

## 2022-11-14 MED ORDER — ONDANSETRON 4 MG PO TBDP
4.0000 mg | ORAL_TABLET | Freq: Once | ORAL | Status: DC
  Filled 2022-11-14: qty 1

## 2022-11-14 NOTE — Discharge Instructions (Addendum)
You were seen in the ER today for dizziness and congestion. Your labs show that you continue to have anemia. I would recommend follow up with your PCP in 2-3 days for continued care.  For the upper respiratory infection, I would recommend staying well hydrated, alternate Ibuprofen and Tylenol for discomfort. Take over the counter cold and flu medication, as needed. I sent an antibiotic to your pharmacy, please pick up and take as prescribed.

## 2022-11-14 NOTE — ED Triage Notes (Signed)
Pt arrives to ED with c/o intermittent dizziness, palpitations/tachycardia, headaches, sinus congestion, and dizziness since 8/14.

## 2022-11-14 NOTE — ED Provider Notes (Signed)
[place] EMERGENCY DEPARTMENT AT [facility] Provider Note   CSN: [id number] Arrival date & time: 11/14/22  4696     History  Chief Complaint  Patient presents with   Palpitations   Dizziness   Hypertension    Linda Fuller is a 32 y.o. female w/ pmhx of anemia requiring transfusion after childbirth, presenting with 5 days of dizziness, congestion, diarrhea, nausea, less appetite, and occasional palpitation upon standing. Pt reports that she has tried multiple OTC cold and flu options and nothing seems to help. She feels dizzy and dehydrated. Reports recent death in the family that has contributed to not feeling well lately. Pt reports anemia is diet controlled, she is not taking iron supplement. Denies pmhx. Denies SOB, CP, syncope or near syncope.    Palpitations Associated symptoms: dizziness   Dizziness Associated symptoms: palpitations   Hypertension       Home Medications Prior to Admission medications   Medication Sig Start Date End Date Taking? Authorizing Provider  Prenatal 27-1 MG TABS Take 1 tablet by mouth daily. 12/23/20   Jerene Bears, MD      Allergies    Norethindrone    Review of Systems   Review of Systems  Cardiovascular:  Positive for palpitations.  Neurological:  Positive for dizziness.    Physical Exam Updated Vital Signs BP (!) 168/98 (BP Location: Right Arm)   Pulse 86   Temp 98.5 F (36.9 C) (Oral)   Resp 10   Ht 5' 8.5" (1.74 m)   Wt 79.4 kg   SpO2 100%   Breastfeeding No   BMI 26.22 kg/m  Physical Exam Vitals and nursing note reviewed.  Constitutional:      General: She is not in acute distress.    Appearance: She is not toxic-appearing.  HENT:     Head: Normocephalic and atraumatic.     Comments: TTP to frontal sinuses b/l     Ears:     Comments: Mild fluid behind TM b/l  Eyes:     General: No scleral icterus.    Conjunctiva/sclera: Conjunctivae normal.  Cardiovascular:     Rate and Rhythm: Normal rate  and regular rhythm.     Pulses: Normal pulses.     Heart sounds: Normal heart sounds.  Pulmonary:     Effort: Pulmonary effort is normal. No respiratory distress.     Breath sounds: Normal breath sounds.  Abdominal:     General: Abdomen is flat. Bowel sounds are normal.     Palpations: Abdomen is soft.     Tenderness: There is no abdominal tenderness.  Skin:    General: Skin is warm and dry.     Findings: No lesion.  Neurological:     General: No focal deficit present.     Mental Status: She is alert and oriented to person, place, and time. Mental status is at baseline.     ED Results / Procedures / Treatments   Labs (all labs ordered are listed, but only abnormal results are displayed) Labs Reviewed  RESP PANEL BY RT-PCR (RSV, FLU A&B, COVID)  [id number]  CBC WITH DIFFERENTIAL/PLATELET  BASIC METABOLIC PANEL  URINALYSIS, ROUTINE W REFLEX MICROSCOPIC  PREGNANCY, URINE    EKG EKG Interpretation Date/Time:  Monday November 14 2022 09:16:18 EDT Ventricular Rate:  79 PR Interval:  155 QRS Duration:  86 QT Interval:  357 QTC Calculation: 410 R Axis:   56  Text Interpretation: Sinus rhythm when compared to prior, no significant changes.  No STEMI Confirmed by Theda Belfast ([id number]) on 11/14/2022 9:19:26 AM  Radiology No results found.  Procedures Procedures    Medications Ordered in ED Medications  sodium chloride 0.9 % bolus 1,000 mL (has no administration in time range)  ondansetron (ZOFRAN-ODT) disintegrating tablet 4 mg (has no administration in time range)  acetaminophen (TYLENOL) tablet 650 mg (has no administration in time range)    ED Course/ Medical Decision Making/ A&P                                 Medical Decision Making Amount and/or Complexity of Data Reviewed Labs: ordered.  Risk OTC drugs. Prescription drug management.   This patient presents to the ED for concern of illness - dizziness, diarrhea, congestion, palpitations this involves an  extensive number of treatment options, and is a complaint that carries with it a high risk of complications and morbidity.  The differential diagnosis includes URI, LRI, dehydration, arrhythmia, gastroenteritis    Co morbidities that complicate the patient evaluation  Denies    Additional history obtained:  None    Lab Tests:  I Ordered, and personally interpreted labs.  The pertinent results include:   Cbc wbc 3.5, rbc 3.7, hgb 9.4, rdw 16.8 Bmp k 3.3 Flu NEG COVID NEG   Imaging Studies ordered:  None   Cardiac Monitoring: / EKG:  The patient was maintained on a cardiac monitor.  I personally viewed and interpreted the cardiac monitored which showed an underlying rhythm of: sinus    Consultations Obtained:  None    Problem List / ED Course / Critical interventions / Medication management  Pt reporting with dizziness, congestion, diarrhea, nausea, less appetite, and occasional palpitation upon standing.  I ordered medication including fluids and zofran  Reevaluation of the patient after these medicines showed that the patient improved I have reviewed the patients home medicines and have made adjustments as needed   Social Determinants of Health:  Reports poor f/u    Plan I would recommend follow up with your PCP in 2-3 days for continued care.  Recommended eating foods high in Iron and taking Iron supplement d/t hgb findings.   Staying well hydrated, alternate Ibuprofen and Tylenol for discomfort. Take over the counter cold and flu medication, as needed.   Abx prescribed for sinus infection         Final Clinical Impression(s) / ED Diagnoses Final diagnoses:  None    Rx / DC Orders ED Discharge Orders     None         Smitty Knudsen, PA-C 11/14/22 1825    Tegeler, Canary Brim, MD 11/15/22 [phone]

## 2022-11-14 NOTE — ED Notes (Signed)
Pt aware of the need for a urine... Unable to currently provide the sample... 

## 2022-11-14 NOTE — ED Notes (Signed)
Discharge paperwork given and verbally understood. 

## 2022-11-15 ENCOUNTER — Telehealth: Payer: Self-pay

## 2022-11-15 NOTE — Transitions of Care (Post Inpatient/ED Visit) (Signed)
   11/15/2022  Name: Linda Fuller MRN: [id number] DOB: 17-Jul-1990  Today's TOC FU Call Status: Today's TOC FU Call Status:: Successful TOC FU Call Completed TOC FU Call Complete Date: 11/15/22  Transition Care Management Follow-up Telephone Call Date of Discharge: 11/14/22 Discharge Facility: [facility] ([facility]) Type of Discharge: Emergency Department Reason for ED Visit: Other: (anemia) How have you been since you were released from the hospital?: Better Any questions or concerns?: No  Items Reviewed: Did you receive and understand the discharge instructions provided?: Yes Medications obtained,verified, and reconciled?: Yes (Medications Reviewed) Any new allergies since your discharge?: No Dietary orders reviewed?: Yes Do you have support at home?: No  Medications Reviewed Today: Medications Reviewed Today     Reviewed by Karena Addison, LPN (Licensed Practical Nurse) on 11/15/22 at 1330  Med List Status: <None>   Medication Order Taking? Sig Documenting Provider Last Dose Status Informant  amoxicillin-clavulanate (AUGMENTIN) 875-125 MG tablet [id number]  Take 1 tablet by mouth every 12 (twelve) hours. Renne Crigler, PA-C  Active   Prenatal 27-1 MG TABS [id number] No Take 1 tablet by mouth daily. Jerene Bears, MD Past Week Active Self            Home Care and Equipment/Supplies: Were Home Health Services Ordered?: NA Any new equipment or medical supplies ordered?: NA  Functional Questionnaire: Do you need assistance with bathing/showering or dressing?: No Do you need assistance with meal preparation?: No Do you need assistance with eating?: No Do you have difficulty maintaining continence: No Do you need assistance with getting out of bed/getting out of a chair/moving?: No Do you have difficulty managing or taking your medications?: No  Follow up appointments reviewed: PCP Follow-up appointment confirmed?: Yes Date of PCP follow-up appointment?:  11/22/22 Follow-up Provider: [facility] Follow-up appointment confirmed?: NA Do you need transportation to your follow-up appointment?: No Do you understand care options if your condition(s) worsen?: Yes-patient verbalized understanding    SIGNATURE Karena Addison, LPN [facility] Nurse Health Advisor Direct Dial [phone]

## 2022-11-22 ENCOUNTER — Other Ambulatory Visit: Payer: Self-pay | Admitting: Internal Medicine

## 2022-11-22 ENCOUNTER — Encounter: Payer: Self-pay | Admitting: Internal Medicine

## 2022-11-22 ENCOUNTER — Ambulatory Visit: Payer: Managed Care, Other (non HMO) | Admitting: Internal Medicine

## 2022-11-22 VITALS — BP 122/78 | HR 71 | Temp 98.6°F | Ht 68.5 in | Wt 183.0 lb

## 2022-11-22 DIAGNOSIS — E559 Vitamin D deficiency, unspecified: Secondary | ICD-10-CM | POA: Diagnosis not present

## 2022-11-22 DIAGNOSIS — F32A Depression, unspecified: Secondary | ICD-10-CM | POA: Diagnosis not present

## 2022-11-22 DIAGNOSIS — E7800 Pure hypercholesterolemia, unspecified: Secondary | ICD-10-CM | POA: Diagnosis not present

## 2022-11-22 DIAGNOSIS — E538 Deficiency of other specified B group vitamins: Secondary | ICD-10-CM

## 2022-11-22 DIAGNOSIS — D649 Anemia, unspecified: Secondary | ICD-10-CM | POA: Diagnosis not present

## 2022-11-22 DIAGNOSIS — J329 Chronic sinusitis, unspecified: Secondary | ICD-10-CM | POA: Diagnosis not present

## 2022-11-22 DIAGNOSIS — K219 Gastro-esophageal reflux disease without esophagitis: Secondary | ICD-10-CM

## 2022-11-22 DIAGNOSIS — F1210 Cannabis abuse, uncomplicated: Secondary | ICD-10-CM

## 2022-11-22 DIAGNOSIS — Z0001 Encounter for general adult medical examination with abnormal findings: Secondary | ICD-10-CM | POA: Insufficient documentation

## 2022-11-22 DIAGNOSIS — E611 Iron deficiency: Secondary | ICD-10-CM

## 2022-11-22 DIAGNOSIS — R739 Hyperglycemia, unspecified: Secondary | ICD-10-CM | POA: Diagnosis not present

## 2022-11-22 DIAGNOSIS — F419 Anxiety disorder, unspecified: Secondary | ICD-10-CM

## 2022-11-22 LAB — URINALYSIS, ROUTINE W REFLEX MICROSCOPIC
Bilirubin Urine: NEGATIVE
Hgb urine dipstick: NEGATIVE
Ketones, ur: NEGATIVE
Leukocytes,Ua: NEGATIVE
Nitrite: NEGATIVE
RBC / HPF: NONE SEEN (ref 0–?)
Specific Gravity, Urine: 1.025 (ref 1.000–1.030)
Total Protein, Urine: NEGATIVE
Urine Glucose: NEGATIVE
Urobilinogen, UA: 0.2 (ref 0.0–1.0)
pH: 6 (ref 5.0–8.0)

## 2022-11-22 LAB — IBC PANEL
Iron: 27 ug/dL — ABNORMAL LOW (ref 42–145)
Saturation Ratios: 5.3 % — ABNORMAL LOW (ref 20.0–50.0)
TIBC: 511 ug/dL — ABNORMAL HIGH (ref 250.0–450.0)
Transferrin: 365 mg/dL — ABNORMAL HIGH (ref 212.0–360.0)

## 2022-11-22 LAB — HEPATIC FUNCTION PANEL
ALT: 11 U/L (ref 0–35)
AST: 14 U/L (ref 0–37)
Albumin: 4.3 g/dL (ref 3.5–5.2)
Alkaline Phosphatase: 38 U/L — ABNORMAL LOW (ref 39–117)
Bilirubin, Direct: 0 mg/dL (ref 0.0–0.3)
Total Bilirubin: 0.3 mg/dL (ref 0.2–1.2)
Total Protein: 7.6 g/dL (ref 6.0–8.3)

## 2022-11-22 LAB — CBC WITH DIFFERENTIAL/PLATELET
Basophils Absolute: 0.1 10*3/uL (ref 0.0–0.1)
Basophils Relative: 1.3 % (ref 0.0–3.0)
Eosinophils Absolute: 0.1 10*3/uL (ref 0.0–0.7)
Eosinophils Relative: 1.4 % (ref 0.0–5.0)
HCT: 30.3 % — ABNORMAL LOW (ref 36.0–46.0)
Hemoglobin: 9.4 g/dL — ABNORMAL LOW (ref 12.0–15.0)
Lymphocytes Relative: 40 % (ref 12.0–46.0)
Lymphs Abs: 1.8 10*3/uL (ref 0.7–4.0)
MCHC: 30.9 g/dL (ref 30.0–36.0)
MCV: 82.2 fl (ref 78.0–100.0)
Monocytes Absolute: 0.4 10*3/uL (ref 0.1–1.0)
Monocytes Relative: 9.6 % (ref 3.0–12.0)
Neutro Abs: 2.1 10*3/uL (ref 1.4–7.7)
Neutrophils Relative %: 47.7 % (ref 43.0–77.0)
Platelets: 295 10*3/uL (ref 150.0–400.0)
RBC: 3.68 Mil/uL — ABNORMAL LOW (ref 3.87–5.11)
RDW: 19 % — ABNORMAL HIGH (ref 11.5–15.5)
WBC: 4.5 10*3/uL (ref 4.0–10.5)

## 2022-11-22 LAB — BASIC METABOLIC PANEL
BUN: 13 mg/dL (ref 6–23)
CO2: 25 mEq/L (ref 19–32)
Calcium: 9.5 mg/dL (ref 8.4–10.5)
Chloride: 104 mEq/L (ref 96–112)
Creatinine, Ser: 0.75 mg/dL (ref 0.40–1.20)
GFR: 104.57 mL/min (ref >60.00)
Glucose, Bld: 90 mg/dL (ref 70–99)
Potassium: 3.8 mEq/L (ref 3.5–5.1)
Sodium: 137 mEq/L (ref 135–145)

## 2022-11-22 LAB — HEMOGLOBIN A1C: Hgb A1c MFr Bld: 5.4 % (ref 4.6–6.5)

## 2022-11-22 LAB — LIPID PANEL
Cholesterol: 167 mg/dL (ref 0–200)
HDL: 48.1 mg/dL (ref >39.00)
LDL Cholesterol: 107 mg/dL — ABNORMAL HIGH (ref 0–99)
NonHDL: 118.65
Total CHOL/HDL Ratio: 3
Triglycerides: 59 mg/dL (ref 0.0–149.0)
VLDL: 11.8 mg/dL (ref 0.0–40.0)

## 2022-11-22 LAB — VITAMIN D 25 HYDROXY (VIT D DEFICIENCY, FRACTURES): VITD: 7.34 ng/mL — ABNORMAL LOW (ref 30.00–100.00)

## 2022-11-22 LAB — FERRITIN: Ferritin: 6.3 ng/mL — ABNORMAL LOW (ref 10.0–291.0)

## 2022-11-22 LAB — TSH: TSH: 0.99 u[IU]/mL (ref 0.35–5.50)

## 2022-11-22 LAB — VITAMIN B12: Vitamin B-12: 330 pg/mL (ref 211–911)

## 2022-11-22 MED ORDER — POLYSACCHARIDE IRON COMPLEX 150 MG PO CAPS: 150.0000 mg | ORAL_CAPSULE | Freq: Two times a day (BID) | ORAL | 1 refills | Status: AC

## 2022-11-22 NOTE — Patient Instructions (Signed)
Your current sinusitis seems to be resolved  Please stop smoking cannabis  Please continue all other medications as before, and refills have been done if requested.  Please have the pharmacy call with any other refills you may need.  Please continue your efforts at being more active, low cholesterol diet, and weight control.  You are otherwise up to date with prevention measures today.  Please keep your appointments with your specialists as you may have planned  You will be contacted regarding the referral for: ENT and Hematology  Please go to the LAB at the blood drawing area for the tests to be done  You will be contacted by phone if any changes need to be made immediately.  Otherwise, you will receive a letter about your results with an explanation, but please check with MyChart first.  Please make an Appointment to return in 6 months, or sooner if needed

## 2022-11-22 NOTE — Assessment & Plan Note (Signed)
Daily use, advised to abstain

## 2022-11-22 NOTE — Assessment & Plan Note (Signed)
Apparently presumed iron deficiency and seemed to respond to IV iron April 2023 but last iron level checked per chart is 2020  - normal.  Maybe has multifactorial issue as mod anemia seems out of proportion to simple low iron?  For f/u lab today, refer hem

## 2022-11-22 NOTE — Progress Notes (Unsigned)
Patient ID: Linda Fuller, female   DOB: 07-10-1990, 32 y.o.   MRN: [id number]         Chief Complaint:: wellness exam and anemia, recurrent sinusitis, cannabis use, anxiety depression       HPI:  Linda Fuller is a 32 y.o. female here for wellness exam; for tdap and flu shot at pharmacy, to see GYN for pap soon, declines covid booster, o/w up to date                        Also Pt denies chest pain, increased sob or doe, wheezing, orthopnea, PND, increased LE swelling, palpitations, dizziness or syncope.   Pt denies polydipsia, polyuria, or new focal neuro s/s.    Pt denies fever, wt loss, night sweats, loss of appetite, or other constitutional symptoms  Has hx of anemia tx with oral and IV iron, but last iron level on chart 2020 is normal.  Pt has ongoing chronic sinusitis worsening recently in frequency and severity, asks for ENT referral.  Smokes cannabis daily.  Denies worsening reflux, abd pain, dysphagia, n/v, bowel change or blood.   Wt Readings from Last 3 Encounters:  11/22/22 183 lb (83 kg)  11/14/22 175 lb (79.4 kg)  06/30/21 197 lb (89.4 kg)   BP Readings from Last 3 Encounters:  11/22/22 122/78  11/14/22 127/84  07/02/21 120/71   Immunization History  Administered Date(s) Administered   Hpv-Unspecified 02/28/2006   Influenza,inj,Quad PF,6+ Mos 12/06/2012   MMR 10/17/2011   PPD Test 12/04/2012   Tdap 10/16/2011   Health Maintenance Due  Topic Date Due   HPV VACCINES (2 - 3-dose series) 03/28/2006   DTaP/Tdap/Td (2 - Td or Tdap) 10/15/2021   PAP SMEAR-Modifier  11/14/2021   COVID-19 Vaccine (1 - 2023-24 season) Never done   INFLUENZA VACCINE  10/27/2022      Past Medical History:  Diagnosis Date   Anemia    ANXIETY 01/27/2010   Chronic constipation 01/22/2013   Depression    GERD (gastroesophageal reflux disease)    IBS (irritable bowel syndrome)    Migraines    with aura   Obesity 09/10/2014   Palpitation    STD (sexually transmitted disease)    HSV2    Past Surgical History:  Procedure Laterality Date   CHOLECYSTECTOMY     THERAPEUTIC ABORTION     elective    reports that she has never smoked. She has never used smokeless tobacco. She reports that she does not currently use alcohol. She reports that she does not currently use drugs after having used the following drugs: Marijuana. family history includes Diabetes in her maternal grandmother and mother; Diabetes type I in her maternal aunt; Heart attack in her maternal aunt; Heart disease in her maternal aunt, maternal grandfather, and mother; Hypertension in her father, maternal aunt, maternal grandfather, maternal grandmother, and mother; Stroke in her maternal grandfather and maternal grandmother. Allergies  Allergen Reactions   Norethindrone Rash   Current Outpatient Medications on File Prior to Visit  Medication Sig Dispense Refill   amoxicillin-clavulanate (AUGMENTIN) 875-125 MG tablet Take 1 tablet by mouth every 12 (twelve) hours. 14 tablet 0   Prenatal 27-1 MG TABS Take 1 tablet by mouth daily. 30 tablet 6   No current facility-administered medications on file prior to visit.        ROS:  All others reviewed and negative.  Objective        PE:  BP  122/78 (BP Location: Left Arm, Patient Position: Sitting, Cuff Size: Large)   Pulse 71   Temp 98.6 F (37 C) (Oral)   Ht 5' 8.5" (1.74 m)   Wt 183 lb (83 kg)   SpO2 98%   BMI 27.42 kg/m                 Constitutional: Pt appears in NAD               HENT: Head: NCAT.                Right Ear: External ear normal.                 Left Ear: External ear normal.                Eyes: . Pupils are equal, round, and reactive to light. Conjunctivae and EOM are normal               Nose: without d/c or deformity               Neck: Neck supple. Gross normal ROM               Cardiovascular: Normal rate and regular rhythm.                 Pulmonary/Chest: Effort normal and breath sounds without rales or wheezing.                 Abd:  Soft, NT, ND, + BS, no organomegaly               Neurological: Pt is alert. At baseline orientation, motor grossly intact               Skin: Skin is warm. No rashes, no other new lesions, LE edema - none               Psychiatric: Pt behavior is normal without agitation   Micro: none  Cardiac tracings I have personally interpreted today:  none  Pertinent Radiological findings (summarize): none   Lab Results  Component Value Date   WBC 4.5 11/22/2022   HGB 9.4 (L) 11/22/2022   HCT 30.3 (L) 11/22/2022   PLT 295.0 11/22/2022   GLUCOSE 90 11/22/2022   CHOL 167 11/22/2022   TRIG 59.0 11/22/2022   HDL 48.10 11/22/2022   LDLCALC 107 (H) 11/22/2022   ALT 11 11/22/2022   AST 14 11/22/2022   NA 137 11/22/2022   K 3.8 11/22/2022   CL 104 11/22/2022   CREATININE 0.75 11/22/2022   BUN 13 11/22/2022   CO2 25 11/22/2022   TSH 0.99 11/22/2022   INR 1.1 (H) 12/11/2018   HGBA1C 5.4 11/22/2022   Assessment/Plan:  Linda Fuller is a 32 y.o. Black or African American [2] female with  has a past medical history of Anemia, ANXIETY (01/27/2010), Chronic constipation (01/22/2013), Depression, GERD (gastroesophageal reflux disease), IBS (irritable bowel syndrome), Migraines, Obesity (09/10/2014), Palpitation, and STD (sexually transmitted disease).  Anemia Apparently presumed iron deficiency and seemed to respond to IV iron April 2023 but last iron level checked per chart is 2020  - normal.  Maybe has multifactorial issue as mod anemia seems out of proportion to simple low iron?  For f/u lab today, refer hem  Cannabis use disorder, mild, abuse Daily use, advised to abstain  Encounter for well adult exam with abnormal findings Age and sex appropriate education and counseling updated with regular exercise  and diet Referrals for preventative services - pt to f/u gyn for pap soon Immunizations addressed - for tdap and flu shot at pharmacy, declines covid booster Smoking counseling  - pt  counsled to d/c cannabis daily  Evidence for depression or other mood disorder - stable overall Most recent labs reviewed. I have personally reviewed and have noted: 1) the patient's medical and social history 2) The patient's current medications and supplements 3) The patient's height, weight, and BMI have been recorded in the chart   Anxiety and depression Stable overall, declines need for change in tx at this time or counseling  Recurrent sinusitis Mild to mod, for ENT referral, to f/u any worsening symptoms or concerns  GERD (gastroesophageal reflux disease) Stable, declines need for PPI and will follow anti reflux diet Followup: Return in about 6 months (around 05/25/2023).  Oliver Barre, MD 11/23/2022 7:56 PM [facility] Internal Medicine

## 2022-11-23 ENCOUNTER — Encounter: Payer: Self-pay | Admitting: Internal Medicine

## 2022-11-23 NOTE — Assessment & Plan Note (Signed)
Stable, declines need for PPI and will follow anti reflux diet

## 2022-11-23 NOTE — Assessment & Plan Note (Signed)
Mild to mod, for ENT referral,  to f/u any worsening symptoms or concerns

## 2022-11-23 NOTE — Assessment & Plan Note (Signed)
Stable overall, declines need for change in tx at this time or counseling

## 2022-11-23 NOTE — Assessment & Plan Note (Addendum)
Age and sex appropriate education and counseling updated with regular exercise and diet Referrals for preventative services - pt to f/u gyn for pap soon Immunizations addressed - for tdap and flu shot at pharmacy, declines covid booster Smoking counseling  - pt counsled to d/c cannabis daily  Evidence for depression or other mood disorder - stable overall Most recent labs reviewed. I have personally reviewed and have noted: 1) the patient's medical and social history 2) The patient's current medications and supplements 3) The patient's height, weight, and BMI have been recorded in the chart

## 2022-12-05 NOTE — Telephone Encounter (Signed)
Pt would like a call about her labs and recent appointment with her doctor she needs advice because she still isn't feeling well.

## 2022-12-05 NOTE — Telephone Encounter (Signed)
Sorry, I dont have anything else to offer, and should cont plan to f/u with Hematology for next steps

## 2022-12-17 ENCOUNTER — Inpatient Hospital Stay: Payer: Managed Care, Other (non HMO) | Attending: Hematology and Oncology

## 2022-12-17 ENCOUNTER — Inpatient Hospital Stay (HOSPITAL_BASED_OUTPATIENT_CLINIC_OR_DEPARTMENT_OTHER): Payer: Managed Care, Other (non HMO) | Admitting: Hematology and Oncology

## 2022-12-17 ENCOUNTER — Other Ambulatory Visit: Payer: Self-pay

## 2022-12-17 VITALS — BP 123/88 | HR 83 | Temp 98.2°F | Resp 16 | Ht 68.5 in | Wt 180.0 lb

## 2022-12-17 DIAGNOSIS — K589 Irritable bowel syndrome without diarrhea: Secondary | ICD-10-CM | POA: Diagnosis not present

## 2022-12-17 DIAGNOSIS — F419 Anxiety disorder, unspecified: Secondary | ICD-10-CM | POA: Insufficient documentation

## 2022-12-17 DIAGNOSIS — D500 Iron deficiency anemia secondary to blood loss (chronic): Secondary | ICD-10-CM

## 2022-12-17 DIAGNOSIS — Z832 Family history of diseases of the blood and blood-forming organs and certain disorders involving the immune mechanism: Secondary | ICD-10-CM | POA: Diagnosis not present

## 2022-12-17 DIAGNOSIS — F1290 Cannabis use, unspecified, uncomplicated: Secondary | ICD-10-CM | POA: Diagnosis not present

## 2022-12-17 DIAGNOSIS — N920 Excessive and frequent menstruation with regular cycle: Secondary | ICD-10-CM

## 2022-12-17 DIAGNOSIS — K219 Gastro-esophageal reflux disease without esophagitis: Secondary | ICD-10-CM | POA: Insufficient documentation

## 2022-12-17 DIAGNOSIS — F32A Depression, unspecified: Secondary | ICD-10-CM | POA: Insufficient documentation

## 2022-12-17 LAB — CMP (CANCER CENTER ONLY)
ALT: 12 U/L (ref 0–44)
AST: 15 U/L (ref 15–41)
Albumin: 4.9 g/dL (ref 3.5–5.0)
Alkaline Phosphatase: 45 U/L (ref 38–126)
Anion gap: 8 (ref 5–15)
BUN: 12 mg/dL (ref 6–20)
CO2: 26 mmol/L (ref 22–32)
Calcium: 10.4 mg/dL — ABNORMAL HIGH (ref 8.9–10.3)
Chloride: 102 mmol/L (ref 98–111)
Creatinine: 0.89 mg/dL (ref 0.44–1.00)
GFR, Estimated: >60 mL/min (ref >60)
Glucose, Bld: 98 mg/dL (ref 70–99)
Potassium: 4.3 mmol/L (ref 3.5–5.1)
Sodium: 136 mmol/L (ref 135–145)
Total Bilirubin: 0.3 mg/dL (ref 0.3–1.2)
Total Protein: 8.9 g/dL — ABNORMAL HIGH (ref 6.5–8.1)

## 2022-12-17 LAB — RETIC PANEL
Immature Retic Fract: 15 % (ref 2.3–15.9)
RBC.: 4.36 MIL/uL (ref 3.87–5.11)
Retic Count, Absolute: 60.6 10*3/uL (ref 19.0–186.0)
Retic Ct Pct: 1.4 % (ref 0.4–3.1)
Reticulocyte Hemoglobin: 33.1 pg (ref >27.9)

## 2022-12-17 LAB — CBC WITH DIFFERENTIAL (CANCER CENTER ONLY)
Abs Immature Granulocytes: 0.04 10*3/uL (ref 0.00–0.07)
Basophils Absolute: 0.1 10*3/uL (ref 0.0–0.1)
Basophils Relative: 2 %
Eosinophils Absolute: 0.1 10*3/uL (ref 0.0–0.5)
Eosinophils Relative: 2 %
HCT: 37.1 % (ref 36.0–46.0)
Hemoglobin: 11.7 g/dL — ABNORMAL LOW (ref 12.0–15.0)
Immature Granulocytes: 1 %
Lymphocytes Relative: 38 %
Lymphs Abs: 1.8 10*3/uL (ref 0.7–4.0)
MCH: 26.6 pg (ref 26.0–34.0)
MCHC: 31.5 g/dL (ref 30.0–36.0)
MCV: 84.3 fL (ref 80.0–100.0)
Monocytes Absolute: 0.4 10*3/uL (ref 0.1–1.0)
Monocytes Relative: 9 %
Neutro Abs: 2.3 10*3/uL (ref 1.7–7.7)
Neutrophils Relative %: 48 %
Platelet Count: 294 10*3/uL (ref 150–400)
RBC: 4.4 MIL/uL (ref 3.87–5.11)
RDW: 18.4 % — ABNORMAL HIGH (ref 11.5–15.5)
WBC Count: 4.7 10*3/uL (ref 4.0–10.5)
nRBC: 0 % (ref 0.0–0.2)

## 2022-12-17 LAB — FERRITIN: Ferritin: 7 ng/mL — ABNORMAL LOW (ref 11–307)

## 2022-12-17 LAB — IRON AND IRON BINDING CAPACITY (CC-WL,HP ONLY)
Iron: 110 ug/dL (ref 28–170)
Saturation Ratios: 19 % (ref 10.4–31.8)
TIBC: 577 ug/dL — ABNORMAL HIGH (ref 250–450)
UIBC: 467 ug/dL — ABNORMAL HIGH (ref 148–442)

## 2022-12-17 NOTE — Progress Notes (Signed)
[facility] [phone]   [phone]  INITIAL CONSULT NOTE  Patient Care Team: Corwin Levins, MD as PCP - General  Hematological/Oncological History # Iron Deficiency Anemia 2/2 to GYN Bleeding 11/22/2022: WBC 4.5, Hgb 9.4, MCV 82.2, Plt 295. Iron sat 5.3%, ferritin 6.3 12/17/2022: establish care with Dr. Leonides Schanz   CHIEF COMPLAINTS/PURPOSE OF CONSULTATION:  "Iron Deficiency Anemia 2/2 to GYN Bleeding "  HISTORY OF PRESENTING ILLNESS:  Linda Fuller 32 y.o. female with medical history significant for anxiety, depression, GERD, IBS, and migraines who presents for evaluation of iron deficiency anemia.  On review of the previous records Linda Fuller had labs collected on 11/22/2022 which showed WBC 4.5, Hgb 9.4, MCV 82.2, Plt 295. Iron sat 5.3%, ferritin 6.3 she was started on p.o. iron therapy.  Due to concern for iron deficiency anemia she was referred to hematology for further evaluation and management.  On exam today Linda Fuller reports that she has been feeling terrible over the last 2 to 3 months with a being worse over the last month.  She reports she is always tired and having some lightheadedness.  She notes her appetite is low and she is having hair loss.  She is also been having palpitations of her heart with some ice cravings.  She has had some mood changes and unfortunately with the iron pills has been struggling with constipation.  She has been taking the pills for about 1 month and reports that they have made things "a little better".  Her menstrual cycles are quite heavy.  She notes that they last for 4 to 5 days and she goes to about 6-7 soaked pads per day.  She notes that she is following along with OB/GYN to help control the bleeding.  The patient also reports that she does not eat any pork or beef and has done this for the last 5 years.  She does enjoy dark green leafy vegetables.  On further discussion the patient reports her mother requires IV  iron infusions.  Her father and boy are healthy.  She notes that she does smoke marijuana daily to help with nausea and her other symptoms.  She does not smoke any cigarettes.  She does not drink any alcohol and currently works as a Restaurant manager, fast food.  She reports that she did receive 1 dose of IV iron before in the past after delivery of her boy 18 months ago.  She otherwise denies any fevers, chills, sweats, nausea, Oni or diarrhea.  Full 10 point ROS is otherwise negative.  MEDICAL HISTORY:  Past Medical History:  Diagnosis Date   Anemia    ANXIETY 01/27/2010   Chronic constipation 01/22/2013   Depression    GERD (gastroesophageal reflux disease)    IBS (irritable bowel syndrome)    Migraines    with aura   Obesity 09/10/2014   Palpitation    STD (sexually transmitted disease)    HSV2    SURGICAL HISTORY: Past Surgical History:  Procedure Laterality Date   CHOLECYSTECTOMY     THERAPEUTIC ABORTION     elective    SOCIAL HISTORY: Social History   Socioeconomic History   Marital status: Married    Spouse name: Not on file   Number of children: 0   Years of education: Not on file   Highest education level: Not on file  Occupational History   Occupation: Administrator, sports    Employer: STUDENT    Employer: OTHER  Comment: [employer]  Tobacco Use   Smoking status: Never   Smokeless tobacco: Never  Vaping Use   Vaping status: Never Used  Substance and Sexual Activity   Alcohol use: Not Currently   Drug use: Not Currently    Types: Marijuana    Comment: 2 weeks ago when had positive UPT-has stopped   Sexual activity: Not Currently    Partners: Male    Birth control/protection: None  Other Topics Concern   Not on file  Social History Narrative   Work- 2 jobs - Horticulturist, 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, 1 part time)      One caffeine drink daily    Social Determinants of Health   Financial Resource Strain: Low Risk  (12/23/2020)   Overall Financial  Resource Strain (CARDIA)    Difficulty of Paying Living Expenses: Not very hard  Food Insecurity: No Food Insecurity (12/23/2020)   Hunger Vital Sign    Worried About Running Out of Food in the Last Year: Never true    Ran Out of Food in the Last Year: Never true  Transportation Needs: No Transportation Needs (12/23/2020)   PRAPARE - Administrator, Civil Service (Medical): No    Lack of Transportation (Non-Medical): No  Physical Activity: Insufficiently Active (12/23/2020)   Exercise Vital Sign    Days of Exercise per Week: 3 days    Minutes of Exercise per Session: 20 min  Stress: No Stress Concern Present (12/23/2020)   [employer] of Occupational Health - Occupational Stress Questionnaire    Feeling of Stress : Only a little  Social Connections: Socially Integrated (12/23/2020)   Social Connection and Isolation Panel [NHANES]    Frequency of Communication with Friends and Family: More than three times a week    Frequency of Social Gatherings with Friends and Family: Twice a week    Attends Religious Services: 1 to 4 times per year    Active Member of [employer] or Organizations: No    Attends Engineer, structural: More than 4 times per year    Marital Status: Married  Catering manager Violence: Not At Risk (12/23/2020)   Humiliation, Afraid, Rape, and Kick questionnaire    Fear of Current or Ex-Partner: No    Emotionally Abused: No    Physically Abused: No    Sexually Abused: No    FAMILY HISTORY: Family History  Problem Relation Age of Onset   Heart disease Mother        Pacemaker    Diabetes Mother    Hypertension Mother    Hypertension Father    Hypertension Maternal Aunt    Diabetes type I Maternal Aunt    Heart attack Maternal Aunt    Heart disease Maternal Aunt    Stroke Maternal Grandmother    Diabetes Maternal Grandmother    Hypertension Maternal Grandmother    Stroke Maternal Grandfather    Heart disease Maternal Grandfather     Hypertension Maternal Grandfather     ALLERGIES:  is allergic to norethindrone.  MEDICATIONS:  Current Outpatient Medications  Medication Sig Dispense Refill   Cholecalciferol (VITAMIN D3) 75 MCG (3000 UT) TABS Take 2,000 mg by mouth 2 (two) times daily.     iron polysaccharides (NU-IRON) 150 MG capsule Take 1 capsule (150 mg total) by mouth 2 (two) times daily. 180 capsule 1   amoxicillin-clavulanate (AUGMENTIN) 875-125 MG tablet Take 1 tablet by mouth every 12 (twelve) hours. (Patient not taking: Reported on  12/17/2022) 14 tablet 0   Prenatal 27-1 MG TABS Take 1 tablet by mouth daily. (Patient not taking: Reported on 12/17/2022) 30 tablet 6   No current facility-administered medications for this visit.    REVIEW OF SYSTEMS:   Constitutional: ( - ) fevers, ( - )  chills , ( - ) night sweats Eyes: ( - ) blurriness of vision, ( - ) double vision, ( - ) watery eyes Ears, nose, mouth, throat, and face: ( - ) mucositis, ( - ) sore throat Respiratory: ( - ) cough, ( - ) dyspnea, ( - ) wheezes Cardiovascular: ( - ) palpitation, ( - ) chest discomfort, ( - ) lower extremity swelling Gastrointestinal:  ( - ) nausea, ( - ) heartburn, ( - ) change in bowel habits Skin: ( - ) abnormal skin rashes Lymphatics: ( - ) new lymphadenopathy, ( - ) easy bruising Neurological: ( - ) numbness, ( - ) tingling, ( - ) new weaknesses Behavioral/Psych: ( - ) mood change, ( - ) new changes  All other systems were reviewed with the patient and are negative.  PHYSICAL EXAMINATION:  Vitals:   12/17/22 0912 12/17/22 0915  BP: (!) 134/97 123/88  Pulse: 83   Resp: 16   Temp: 98.2 F (36.8 C)   SpO2: 100%    Filed Weights   12/17/22 0912  Weight: 180 lb (81.6 kg)    GENERAL: well appearing middle-aged African-American female in NAD  SKIN: skin color, texture, turgor are normal, no rashes or significant lesions EYES: conjunctiva are pink and non-injected, sclera clear LUNGS: clear to auscultation and  percussion with normal breathing effort HEART: regular rate & rhythm and no murmurs and no lower extremity edema Musculoskeletal: no cyanosis of digits and no clubbing  PSYCH: alert & oriented x 3, fluent speech NEURO: no focal motor/sensory deficits  LABORATORY DATA:  I have reviewed the data as listed    Latest Ref Rng & Units 11/22/2022    9:33 AM 11/14/2022    9:18 AM 07/01/2021    5:08 AM  CBC  WBC 4.0 - 10.5 K/uL 4.5  3.5  6.7   Hemoglobin 12.0 - 15.0 g/dL 9.4  9.4  6.5   Hematocrit 36.0 - 46.0 % 30.3  29.6  20.5   Platelets 150.0 - 400.0 K/uL 295.0  329  226        Latest Ref Rng & Units 11/22/2022    9:33 AM 11/14/2022    9:18 AM 12/04/2020    6:04 AM  CMP  Glucose 70 - 99 mg/dL 90  95  94   BUN 6 - 23 mg/dL 13  8  6    Creatinine 0.40 - 1.20 mg/dL 2.95  6.21  3.08   Sodium 135 - 145 mEq/L 137  138  136   Potassium 3.5 - 5.1 mEq/L 3.8  3.3  3.3   Chloride 96 - 112 mEq/L 104  104  105   CO2 19 - 32 mEq/L 25  23  22    Calcium 8.4 - 10.5 mg/dL 9.5  9.5  9.5   Total Protein 6.0 - 8.3 g/dL 7.6     Total Bilirubin 0.2 - 1.2 mg/dL 0.3     Alkaline Phos 39 - 117 U/L 38     AST 0 - 37 U/L 14     ALT 0 - 35 U/L 11        ASSESSMENT & PLAN Linda Fuller 32 y.o. female with medical  history significant for anxiety, depression, GERD, IBS, and migraines who presents for evaluation of iron deficiency anemia.  After review of the labs, review of the records, and discussion with the patient the patients findings are most consistent with iron deficiency anemia secondary to heavy GYN bleeding.  The patient does follow with OB/GYN and is currently looking for options to better control her cycles.  She is tried p.o. iron therapy without much improvement.  Therefore I do believe she is a good candidate for IV iron therapy at this time.  # Iron Deficiency Anemia 2/2 to GYN Bleeding -- Findings are consistent with iron deficiency anemia secondary to patient's menorrhagia --Encouraged her to  follow-up with OB/GYN for better control of her menstrual cycles --We will confirm iron deficiency anemia by ordering iron panel and ferritin as well as reticulocytes, CBC, and CMP --Continue ferrous sulfate 325 mg daily with a source of vitamin C --We will plan to proceed with IV iron therapy in order to help bolster the patient's blood counts --Plan for return to clinic in 4 to 6 weeks time after last dose of IV iron   Orders Placed This Encounter  Procedures   CBC with Differential (Cancer Center Only)    Standing Status:   Future    Number of Occurrences:   1    Standing Expiration Date:   12/17/2023   CMP (Cancer Center only)    Standing Status:   Future    Number of Occurrences:   1    Standing Expiration Date:   12/17/2023   Ferritin    Standing Status:   Future    Number of Occurrences:   1    Standing Expiration Date:   12/17/2023   Iron and Iron Binding Capacity ([facility] only)    Standing Status:   Future    Number of Occurrences:   1    Standing Expiration Date:   12/17/2023   Retic Panel    Standing Status:   Future    Number of Occurrences:   1    Standing Expiration Date:   12/17/2023    All questions were answered. The patient knows to call the clinic with any problems, questions or concerns.  A total of more than 60 minutes were spent on this encounter with face-to-face time and non-face-to-face time, including preparing to see the patient, ordering tests and/or medications, counseling the patient and coordination of care as outlined above.   Ulysees Barns, MD Department of Hematology/Oncology [facility] Phone: [phone] Pager: [phone] Email: [name]  12/17/2022 10:00 AM

## 2022-12-19 ENCOUNTER — Telehealth: Payer: Self-pay | Admitting: Pharmacy Technician

## 2022-12-19 ENCOUNTER — Other Ambulatory Visit: Payer: Self-pay | Admitting: Hematology and Oncology

## 2022-12-19 ENCOUNTER — Other Ambulatory Visit: Payer: Self-pay | Admitting: Pharmacy Technician

## 2022-12-19 NOTE — Telephone Encounter (Signed)
Dr. Leonides Schanz,  Monoferric is non preferred and will be denied if patient has not failed preferred medicaiton. Preferred medication is Venofer. Would you like to use venofer?  Auth Submission: NO AUTH NEEDED Site of care: Site of care: [facility] Payer: [employer] Medication & CPT/J Code(s) submitted: Venofer (Iron Sucrose) J1756 Route of submission (phone, fax, portal):  Phone # Fax # Auth type: Buy/Bill PB Units/visits requested:  Reference number:  Approval from: 12/19/22 to 02/25/23

## 2022-12-19 NOTE — Telephone Encounter (Signed)
Dr. Leonides Schanz, Thank you, will do.

## 2022-12-20 ENCOUNTER — Ambulatory Visit (INDEPENDENT_AMBULATORY_CARE_PROVIDER_SITE_OTHER): Payer: Managed Care, Other (non HMO)

## 2022-12-20 VITALS — BP 115/77 | HR 75 | Temp 98.5°F | Resp 16 | Ht 68.5 in | Wt 181.0 lb

## 2022-12-20 DIAGNOSIS — D500 Iron deficiency anemia secondary to blood loss (chronic): Secondary | ICD-10-CM

## 2022-12-20 DIAGNOSIS — N920 Excessive and frequent menstruation with regular cycle: Secondary | ICD-10-CM

## 2022-12-20 MED ORDER — ACETAMINOPHEN 325 MG PO TABS
650.0000 mg | ORAL_TABLET | Freq: Once | ORAL | Status: AC
  Administered 2022-12-20: 650 mg via ORAL
  Filled 2022-12-20: qty 2

## 2022-12-20 MED ORDER — SODIUM CHLORIDE 0.9 % IV SOLN
200.0000 mg | Freq: Once | INTRAVENOUS | Status: AC
  Administered 2022-12-20: 200 mg via INTRAVENOUS
  Filled 2022-12-20: qty 10

## 2022-12-20 MED ORDER — DIPHENHYDRAMINE HCL 25 MG PO CAPS
25.0000 mg | ORAL_CAPSULE | Freq: Once | ORAL | Status: AC
  Administered 2022-12-20: 25 mg via ORAL
  Filled 2022-12-20: qty 1

## 2022-12-20 NOTE — Progress Notes (Signed)
Diagnosis: Iron Deficiency Anemia  Provider:  Chilton Greathouse MD  Procedure: IV Infusion  IV Type: Peripheral, IV Location: L Antecubital  Venofer (Iron Sucrose), Dose: 200 mg  Infusion Start Time: 1355  Infusion Stop Time: 1414  Post Infusion IV Care: Observation period completed and Peripheral IV Discontinued  Discharge: Condition: Good, Destination: Home . AVS Declined  Performed by:  Rico Ala, LPN

## 2022-12-27 ENCOUNTER — Ambulatory Visit: Payer: Managed Care, Other (non HMO)

## 2022-12-27 ENCOUNTER — Institutional Professional Consult (permissible substitution) (INDEPENDENT_AMBULATORY_CARE_PROVIDER_SITE_OTHER): Payer: Managed Care, Other (non HMO) | Admitting: Otolaryngology

## 2022-12-27 VITALS — BP 128/80 | HR 64 | Temp 98.0°F | Resp 16 | Ht 68.5 in | Wt 182.6 lb

## 2022-12-27 DIAGNOSIS — N920 Excessive and frequent menstruation with regular cycle: Secondary | ICD-10-CM | POA: Diagnosis not present

## 2022-12-27 DIAGNOSIS — D500 Iron deficiency anemia secondary to blood loss (chronic): Secondary | ICD-10-CM

## 2022-12-27 MED ORDER — ACETAMINOPHEN 325 MG PO TABS: 650.0000 mg | ORAL_TABLET | Freq: Once | ORAL | Status: DC

## 2022-12-27 MED ORDER — SODIUM CHLORIDE 0.9 % IV SOLN
200.0000 mg | Freq: Once | INTRAVENOUS | Status: AC
  Administered 2022-12-27: 200 mg via INTRAVENOUS
  Filled 2022-12-27: qty 10

## 2022-12-27 MED ORDER — DIPHENHYDRAMINE HCL 25 MG PO CAPS: 25.0000 mg | ORAL_CAPSULE | Freq: Once | ORAL | Status: DC

## 2022-12-27 NOTE — Progress Notes (Signed)
Diagnosis: Iron Deficiency Anemia  Provider:  Chilton Greathouse MD  Procedure: IV Infusion  IV Type: Peripheral, IV Location: L Antecubital  Venofer (Iron Sucrose), Dose: 200 mg  Infusion Start Time: 0919  Infusion Stop Time: 0938  Post Infusion IV Care: Peripheral IV Discontinued  Discharge: Condition: Good, Destination: Home . AVS Declined  Performed by:  Rico Ala, LPN

## 2023-01-03 ENCOUNTER — Ambulatory Visit: Payer: Managed Care, Other (non HMO)

## 2023-01-03 VITALS — BP 125/79 | HR 69 | Temp 97.7°F | Resp 14 | Ht 68.5 in | Wt 189.0 lb

## 2023-01-03 DIAGNOSIS — N920 Excessive and frequent menstruation with regular cycle: Secondary | ICD-10-CM | POA: Diagnosis not present

## 2023-01-03 DIAGNOSIS — D500 Iron deficiency anemia secondary to blood loss (chronic): Secondary | ICD-10-CM

## 2023-01-03 MED ORDER — DIPHENHYDRAMINE HCL 25 MG PO CAPS: 25.0000 mg | ORAL_CAPSULE | Freq: Once | ORAL | Status: DC

## 2023-01-03 MED ORDER — ACETAMINOPHEN 325 MG PO TABS: 650.0000 mg | ORAL_TABLET | Freq: Once | ORAL | Status: DC

## 2023-01-03 MED ORDER — SODIUM CHLORIDE 0.9 % IV SOLN
200.0000 mg | Freq: Once | INTRAVENOUS | Status: AC
  Administered 2023-01-03: 200 mg via INTRAVENOUS
  Filled 2023-01-03: qty 10

## 2023-01-03 NOTE — Progress Notes (Signed)
Diagnosis: Iron Deficiency Anemia  Provider:  Chilton Greathouse MD  Procedure: IV Infusion  IV Type: Peripheral, IV Location: L Antecubital  Venofer (Iron Sucrose), Dose: 200 mg  Infusion Start Time: 0913  Infusion Stop Time: 0928  Post Infusion IV Care: Patient declined observation and Peripheral IV Discontinued  Discharge: Condition: Stable, Destination: Home . AVS Declined  Performed by:  Wyvonne Lenz, RN

## 2023-01-10 ENCOUNTER — Ambulatory Visit: Payer: Managed Care, Other (non HMO)

## 2023-01-10 VITALS — BP 112/71 | HR 79 | Temp 98.5°F | Resp 12 | Ht 68.5 in | Wt 189.6 lb

## 2023-01-10 DIAGNOSIS — N920 Excessive and frequent menstruation with regular cycle: Secondary | ICD-10-CM

## 2023-01-10 DIAGNOSIS — D500 Iron deficiency anemia secondary to blood loss (chronic): Secondary | ICD-10-CM | POA: Diagnosis not present

## 2023-01-10 MED ORDER — SODIUM CHLORIDE 0.9 % IV SOLN
200.0000 mg | Freq: Once | INTRAVENOUS | Status: AC
  Administered 2023-01-10: 200 mg via INTRAVENOUS
  Filled 2023-01-10: qty 10

## 2023-01-10 MED ORDER — ACETAMINOPHEN 325 MG PO TABS: 650.0000 mg | ORAL_TABLET | Freq: Once | ORAL | Status: DC

## 2023-01-10 MED ORDER — DIPHENHYDRAMINE HCL 25 MG PO CAPS: 25.0000 mg | ORAL_CAPSULE | Freq: Once | ORAL | Status: DC

## 2023-01-10 NOTE — Progress Notes (Signed)
Diagnosis: Acute Anemia  Provider:  Chilton Greathouse MD  Procedure: IV Infusion  IV Type: Peripheral, IV Location: L Antecubital  Venofer (Iron Sucrose), Dose: 200 mg  Infusion Start Time: 0913  Infusion Stop Time: 0931  Post Infusion IV Care: Patient declined observation and Peripheral IV Discontinued  Discharge: Condition: Stable, Destination: Home . AVS Declined  Performed by:  Nat Math, RN

## 2023-01-11 ENCOUNTER — Encounter: Payer: Self-pay | Admitting: Hematology and Oncology

## 2023-01-17 ENCOUNTER — Ambulatory Visit: Payer: Managed Care, Other (non HMO)

## 2023-01-17 VITALS — BP 116/72 | HR 74 | Temp 98.5°F | Resp 16 | Ht 66.0 in | Wt 189.6 lb

## 2023-01-17 DIAGNOSIS — N920 Excessive and frequent menstruation with regular cycle: Secondary | ICD-10-CM | POA: Diagnosis not present

## 2023-01-17 DIAGNOSIS — D500 Iron deficiency anemia secondary to blood loss (chronic): Secondary | ICD-10-CM

## 2023-01-17 MED ORDER — ACETAMINOPHEN 325 MG PO TABS
650.0000 mg | ORAL_TABLET | Freq: Once | ORAL | Status: DC
Start: 2023-01-17 — End: 2023-01-17

## 2023-01-17 MED ORDER — DIPHENHYDRAMINE HCL 25 MG PO CAPS: 25.0000 mg | ORAL_CAPSULE | Freq: Once | ORAL | Status: DC

## 2023-01-17 MED ORDER — IRON SUCROSE 20 MG/ML IV SOLN
200.0000 mg | Freq: Once | INTRAVENOUS | Status: AC
  Administered 2023-01-17: 200 mg via INTRAVENOUS
  Filled 2023-01-17: qty 10

## 2023-01-17 NOTE — Progress Notes (Cosign Needed Addendum)
Diagnosis: Iron Deficiency Anemia  Provider:  Chilton Greathouse MD  Procedure:  IV push  IV Type: Peripheral, IV Location: L Antecubital  Venofer (Iron Sucrose), Dose: 200 mg  Infusion Start Time: 0925  Infusion Stop Time: 0930  Post Infusion IV Care: Observation period completed and Peripheral IV Discontinued  Discharge: Condition: Good, Destination: Home . AVS Declined  Performed by:  Rico Ala, LPN

## 2023-01-19 ENCOUNTER — Ambulatory Visit (INDEPENDENT_AMBULATORY_CARE_PROVIDER_SITE_OTHER): Payer: Managed Care, Other (non HMO) | Admitting: Otolaryngology

## 2023-01-19 ENCOUNTER — Encounter (INDEPENDENT_AMBULATORY_CARE_PROVIDER_SITE_OTHER): Payer: Self-pay

## 2023-01-19 VITALS — Ht 68.0 in | Wt 189.0 lb

## 2023-01-19 DIAGNOSIS — J328 Other chronic sinusitis: Secondary | ICD-10-CM

## 2023-01-19 DIAGNOSIS — J342 Deviated nasal septum: Secondary | ICD-10-CM

## 2023-01-19 DIAGNOSIS — J343 Hypertrophy of nasal turbinates: Secondary | ICD-10-CM

## 2023-01-19 MED ORDER — PREDNISONE 10 MG PO TABS: 10.0000 mg | ORAL_TABLET | Freq: Every day | ORAL | 0 refills | Status: AC

## 2023-01-19 MED ORDER — FLONASE SENSIMIST 27.5 MCG/SPRAY NA SUSP: 2.0000 | Freq: Every day | NASAL | 5 refills | Status: AC

## 2023-01-19 MED ORDER — AMOXICILLIN-POT CLAVULANATE 875-125 MG PO TABS: 1.0000 | ORAL_TABLET | Freq: Two times a day (BID) | ORAL | 0 refills | Status: AC

## 2023-01-19 NOTE — Progress Notes (Signed)
Dear Dr. [name], Here is my assessment for our mutual patient, Mahrosh Poveromo. Thank you for allowing me the opportunity to care for your patient. Please do not hesitate to contact me should you have any other questions. Sincerely, Dr. Jovita Kussmaul  Otolaryngology Clinic Note  HISTORY: Linda Fuller is a 32 y.o. female kindly referred by Dr. [name] for evaluation of bilateral sinusitis  Ongoing for about 2 years. She reports that she has facial pressure and pain (frontal), and she has multiple episodes of anterior rhinorrhea and post nasal drip. She typically has some yellow colored mucus. She gets about 4-6 infections/year (some ear pressure, and some frontal pressure, max and between eye pressure and pain, then drainage and coughing and some lightheadedness). No change in sense of smell. Does have some congestion - left side. Between episodes feels normal. Feels like things are worse on left. Has a 19 month old in daycare so things have been worse since birth. She typically gets about 3 courses of antibiotics - last time was August (Augmentin). She got a steroid shot 1 year ago for this and medrol pack. She feels like antibiotics and steroids don't necessarily help. Mucinex and sudafed helps. Currently only some congestion. Has not tried nasal rinses; has tried flonase (gives headache), no PO antihistamine. Has tried afrin but not for prolonged period. Allergy testing has not been done.  No previous sinonasal surgery.  She is currently using nothing.  PMHx: Anxiety, Depression, GERD, Migraines, IBS, Anemia Soc Hx: smokes cannabis daily  RADIOGRAPHIC EVALUATION AND INDEPENDENT REVIEW OF OTHER RECORDS:: CBC 11/2022: Eos 100 (but prior 0); Hgb 11.7 ED and PCP notes reviewed (2024, 2022) No imaging  Past Medical History:  Diagnosis Date   Anemia    ANXIETY 01/27/2010   Chronic constipation 01/22/2013   Depression    GERD (gastroesophageal reflux disease)    IBS (irritable bowel syndrome)     Migraines    with aura   Obesity 09/10/2014   Palpitation    STD (sexually transmitted disease)    HSV2   Past Surgical History:  Procedure Laterality Date   CHOLECYSTECTOMY     THERAPEUTIC ABORTION     elective   Family History  Problem Relation Age of Onset   Heart disease Mother        Pacemaker    Diabetes Mother    Hypertension Mother    Hypertension Father    Hypertension Maternal Aunt    Diabetes type I Maternal Aunt    Heart attack Maternal Aunt    Heart disease Maternal Aunt    Stroke Maternal Grandmother    Diabetes Maternal Grandmother    Hypertension Maternal Grandmother    Stroke Maternal Grandfather    Heart disease Maternal Grandfather    Hypertension Maternal Grandfather    Social History   Tobacco Use   Smoking status: Never   Smokeless tobacco: Never  Substance Use Topics   Alcohol use: Not Currently   Allergies  Allergen Reactions   Norethindrone Rash   Current Outpatient Medications  Medication Sig Dispense Refill   amoxicillin-clavulanate (AUGMENTIN) 875-125 MG tablet Take 1 tablet by mouth every 12 (twelve) hours. (Patient not taking: Reported on 12/17/2022) 14 tablet 0   Cholecalciferol (VITAMIN D3) 75 MCG (3000 UT) TABS Take 2,000 mg by mouth 2 (two) times daily.     iron polysaccharides (NU-IRON) 150 MG capsule Take 1 capsule (150 mg total) by mouth 2 (two) times daily. 180 capsule 1   Prenatal 27-1 MG  TABS Take 1 tablet by mouth daily. (Patient not taking: Reported on 12/17/2022) 30 tablet 6   No current facility-administered medications for this visit.   Ht 5\' 8"  (1.727 m)   Wt 189 lb (85.7 kg)   LMP 01/19/2023 (Approximate)   BMI 28.74 kg/m   PHYSICAL EXAM:  Ht 5\' 8"  (1.727 m)   Wt 189 lb (85.7 kg)   LMP 01/19/2023 (Approximate)   BMI 28.74 kg/m    Salient findings:  CN II-XII intact  Bilateral EAC clear and TM intact with well pneumatized middle ear spaces Nose: Anterior rhinoscopy reveals septum relatively midline with  bilateral inferior turbinate hypertrophy.  Nasal endoscopy was indicated to better evaluate the nose and paranasal sinuses, given the patient's history and exam findings, and is detailed below. No lesions of oral cavity/oropharynx No obviously palpable neck masses/lymphadenopathy/thyromegaly No respiratory distress or stridor   PROCEDURE: Diagnostic Nasal Endoscopy Pre-procedure diagnosis: Concern for sinusitis Post-procedure diagnosis: same Indication: See pre-procedure diagnosis and physical exam above Complications: None apparent EBL: 0 mL Anesthesia: Lidocaine 4% and topical decongestant was topically sprayed in each nasal cavity  Description of Procedure:  Patient was identified. A rigid 0 degree endoscope was utilized to evaluate the sinonasal cavities, mucosa, sinus ostia and turbinates and septum.  Overall, mild signs of mucosal inflammation are noted.  No mucopurulence, polyps, or masses noted.   Right Middle meatus: clear, some mucosal edema Right SE Recess: clear Left MM: clear, some mucosal edema Left SE Recess: clear  Photodocumentation was obtained.  CPT CODE -- 11914 - Mod 25  ASSESSMENT:  Chronic sinusitis, worse on left (seems to me more likely recurrent acute)- multiple episodes of exacerbation over past 18 months; improvement with abx and steroids Nasal septal deviation - left Bilateral inferior turbinate hypertrophy   PLAN: We've discussed issues and options today.  We reviewed the nasal endoscopy images together.  The risks, benefits and alternatives were discussed and questions answered.  She has elected to proceed with maximal medical therapy and obtain post-treatment scans:  1) Augmentin 875 mg PO BID x10d; take with food, take probiotic or yogurt with it; risks/SE discussed 2) Take Prednisone by mouth (PO) 30mg  x 4 days (3 pills in morning), then 20mg  x4 days (2 pills), then 10mg  x 4 days (1 pill), then stop. Risks discussed  3) She has some headache with  Flonase, will switch to flonase sensimist 27.34mcg 2 sprays BID 4) Post treatment CT [facility]lab sinus - schedule in about 4 weeks 5) Daily Neilmed sinus rinse 2) Follow-up in 4-6 weeks -- sooner as necessary.  MDM:  Level 4: 99204 Complexity/Problems addressed: low Data complexity: multiple labs and charts reviewed - Morbidity: mod  - Prescription Drug prescribed or managed: yes    Thank you for allowing me the opportunity to care for your patient. Please do not hesitate to contact me should you have any other questions.  Sincerely, Jovita Kussmaul, MD Otolarynoglogist (ENT), [facility] Phone: [phone] Fax: [phone]  01/19/2023, 10:37 AM

## 2023-01-19 NOTE — Patient Instructions (Addendum)
1) Augmentin 875 mg PO twice daily x10d; take with food, take probiotic or yogurt with it 2) Take Prednisone by mouth (PO) 30mg  x 4 days (3 pills in morning), then 20mg  x4 days (2 pills), then 10mg  x 4 days (1 pill), then stop. 3) Flonase sensimist - use two sprays each nostril daily until you see Dr. Allena Katz in follow up 4) I have ordered an imaging study for you to complete prior to your next visit. Please call [facility] Scheduling at [phone] to schedule your imaging if you have not received a call within 24 hours. If you are unable to complete your imaging study prior to your next scheduled visit please call our office to let us know.   Lloyd Huger Med Nasal Saline Rinse   - start nasal saline rinses with NeilMed Bottle available over the counter    Nasal Saline Irrigation instructions: If you choose to make your own salt water solution, You will need: Salt (kosher, canning, or pickling salt) Baking soda Nasal irrigation bottle (i.e. Lloyd Huger Med Sinus Rinse) Measuring spoon ( teaspoon) Distilled / boiled water   Mix solution Mix 1 teaspoon of salt, 1/2 teaspoon of baking soda and 1 cup of water into irrigation bottle ** May use saline packet instead of homemade recipe for this step if you prefer If medicine was prescribed to be mixed with solution, place this into bottle Examples 2 inches of 2% mupirocin ointment Budesonide solution Position your head: Lean over sink (about 45 degrees) Rotate head (about 45 degrees) so that one nostril is above the other Irrigate Insert tip of irrigation bottle into upper nostril so it forms a comfortable seal Irrigate while breathing through your mouth May remove the straw from the bottle in order to irrigate the entire solution (important if medicine was added) Exhale through nose when finished and blow nose as necessary  Repeat on opposite side with other 1/2 of solution (120 mL) or remake solution if all 240 mL was used on first  side Wash irrigation bottle regularly, replace every 3 months

## 2023-02-09 ENCOUNTER — Telehealth: Payer: Self-pay | Admitting: Hematology and Oncology

## 2023-03-02 ENCOUNTER — Ambulatory Visit (INDEPENDENT_AMBULATORY_CARE_PROVIDER_SITE_OTHER): Payer: Managed Care, Other (non HMO)

## 2023-03-02 ENCOUNTER — Ambulatory Visit (HOSPITAL_COMMUNITY)
Admission: RE | Admit: 2023-03-02 | Discharge: 2023-03-02 | Disposition: A | Discharge status: Home / Self Care | Payer: Managed Care, Other (non HMO) | Source: Ambulatory Visit | Attending: Otolaryngology | Admitting: Otolaryngology

## 2023-03-02 DIAGNOSIS — J342 Deviated nasal septum: Secondary | ICD-10-CM | POA: Insufficient documentation

## 2023-03-02 DIAGNOSIS — J328 Other chronic sinusitis: Secondary | ICD-10-CM | POA: Diagnosis present

## 2023-03-02 DIAGNOSIS — J343 Hypertrophy of nasal turbinates: Secondary | ICD-10-CM | POA: Insufficient documentation

## 2023-03-03 ENCOUNTER — Other Ambulatory Visit: Payer: Self-pay | Admitting: *Deleted

## 2023-03-03 DIAGNOSIS — D500 Iron deficiency anemia secondary to blood loss (chronic): Secondary | ICD-10-CM

## 2023-03-06 ENCOUNTER — Inpatient Hospital Stay: Payer: Managed Care, Other (non HMO) | Attending: Hematology and Oncology

## 2023-03-06 DIAGNOSIS — N920 Excessive and frequent menstruation with regular cycle: Secondary | ICD-10-CM | POA: Insufficient documentation

## 2023-03-06 DIAGNOSIS — D500 Iron deficiency anemia secondary to blood loss (chronic): Secondary | ICD-10-CM | POA: Diagnosis present

## 2023-03-06 LAB — RETIC PANEL
Immature Retic Fract: 11.9 % (ref 2.3–15.9)
RBC.: 3.72 MIL/uL — ABNORMAL LOW (ref 3.87–5.11)
Retic Count, Absolute: 76.3 10*3/uL (ref 19.0–186.0)
Retic Ct Pct: 2.1 % (ref 0.4–3.1)
Reticulocyte Hemoglobin: 33.7 pg (ref >27.9)

## 2023-03-06 LAB — CBC WITH DIFFERENTIAL (CANCER CENTER ONLY)
Abs Immature Granulocytes: 0.01 10*3/uL (ref 0.00–0.07)
Basophils Absolute: 0.1 10*3/uL (ref 0.0–0.1)
Basophils Relative: 1 %
Eosinophils Absolute: 0.1 10*3/uL (ref 0.0–0.5)
Eosinophils Relative: 1 %
HCT: 35.9 % — ABNORMAL LOW (ref 36.0–46.0)
Hemoglobin: 11.8 g/dL — ABNORMAL LOW (ref 12.0–15.0)
Immature Granulocytes: 0 %
Lymphocytes Relative: 19 %
Lymphs Abs: 1.1 10*3/uL (ref 0.7–4.0)
MCH: 31.2 pg (ref 26.0–34.0)
MCHC: 32.9 g/dL (ref 30.0–36.0)
MCV: 95 fL (ref 80.0–100.0)
Monocytes Absolute: 0.5 10*3/uL (ref 0.1–1.0)
Monocytes Relative: 9 %
Neutro Abs: 4 10*3/uL (ref 1.7–7.7)
Neutrophils Relative %: 70 %
Platelet Count: 277 10*3/uL (ref 150–400)
RBC: 3.78 MIL/uL — ABNORMAL LOW (ref 3.87–5.11)
RDW: 14.8 % (ref 11.5–15.5)
WBC Count: 5.7 10*3/uL (ref 4.0–10.5)
nRBC: 0 % (ref 0.0–0.2)

## 2023-03-06 LAB — CMP (CANCER CENTER ONLY)
ALT: 12 U/L (ref 0–44)
AST: 12 U/L — ABNORMAL LOW (ref 15–41)
Albumin: 4.3 g/dL (ref 3.5–5.0)
Alkaline Phosphatase: 36 U/L — ABNORMAL LOW (ref 38–126)
Anion gap: 7 (ref 5–15)
BUN: 15 mg/dL (ref 6–20)
CO2: 25 mmol/L (ref 22–32)
Calcium: 9.3 mg/dL (ref 8.9–10.3)
Chloride: 106 mmol/L (ref 98–111)
Creatinine: 0.82 mg/dL (ref 0.44–1.00)
GFR, Estimated: >60 mL/min (ref >60)
Glucose, Bld: 91 mg/dL (ref 70–99)
Potassium: 4 mmol/L (ref 3.5–5.1)
Sodium: 138 mmol/L (ref 135–145)
Total Bilirubin: 0.4 mg/dL (ref <1.2)
Total Protein: 7.5 g/dL (ref 6.5–8.1)

## 2023-03-06 LAB — IRON AND IRON BINDING CAPACITY (CC-WL,HP ONLY)
Iron: 97 ug/dL (ref 28–170)
Saturation Ratios: 24 % (ref 10.4–31.8)
TIBC: 405 ug/dL (ref 250–450)
UIBC: 308 ug/dL (ref 148–442)

## 2023-03-06 LAB — FERRITIN: Ferritin: 69 ng/mL (ref 11–307)

## 2023-03-07 ENCOUNTER — Encounter (INDEPENDENT_AMBULATORY_CARE_PROVIDER_SITE_OTHER): Payer: Self-pay

## 2023-03-07 ENCOUNTER — Ambulatory Visit (INDEPENDENT_AMBULATORY_CARE_PROVIDER_SITE_OTHER): Payer: Managed Care, Other (non HMO) | Admitting: Otolaryngology

## 2023-03-07 DIAGNOSIS — J343 Hypertrophy of nasal turbinates: Secondary | ICD-10-CM | POA: Diagnosis not present

## 2023-03-07 DIAGNOSIS — J3489 Other specified disorders of nose and nasal sinuses: Secondary | ICD-10-CM

## 2023-03-07 DIAGNOSIS — J0181 Other acute recurrent sinusitis: Secondary | ICD-10-CM

## 2023-03-07 DIAGNOSIS — J342 Deviated nasal septum: Secondary | ICD-10-CM | POA: Diagnosis not present

## 2023-03-07 MED ORDER — AMOXICILLIN-POT CLAVULANATE 875-125 MG PO TABS: 1.0000 | ORAL_TABLET | Freq: Two times a day (BID) | ORAL | 0 refills | Status: AC

## 2023-03-07 MED ORDER — CETIRIZINE HCL 10 MG PO TABS: 10.0000 mg | ORAL_TABLET | Freq: Every day | ORAL | 11 refills | Status: AC

## 2023-03-07 NOTE — Progress Notes (Signed)
Dear Dr. [name], Here is my assessment for our mutual patient, Linda Fuller. Thank you for allowing me the opportunity to care for your patient. Please do not hesitate to contact me should you have any other questions. Sincerely, Dr. Jovita Kussmaul  Otolaryngology Clinic Note  HISTORY: Linda Fuller is a 32 y.o. female kindly referred by Dr. [name] for evaluation of bilateral sinusitis  Initial visit (01/19/2023): Ongoing for about 2 years. She reports that she has facial pressure and pain (frontal), and she has multiple episodes of anterior rhinorrhea and post nasal drip. She typically has some yellow colored mucus. She gets about 4-6 infections/year (some ear pressure, and some frontal pressure, max and between eye pressure and pain, then drainage and coughing and some lightheadedness). No change in sense of smell. Does have some congestion - left side. Between episodes feels normal. Feels like things are worse on left. Has a 31 month old in daycare so things have been worse since birth. She typically gets about 3 courses of antibiotics - last time was August (Augmentin). She got a steroid shot 1 year ago for this and medrol pack. She feels like antibiotics and steroids don't necessarily help. Mucinex and sudafed helps. Currently only some congestion. Has not tried nasal rinses; has tried flonase (gives headache), no PO antihistamine. Has tried afrin but not for prolonged period. Allergy testing has not been done.  No previous sinonasal surgery. She is currently using nothing.  ------------------------------------------ We decided to do maximal medical management and decide to obtain a post-treatment sinus CT. She returns for follow up.   02/2023: She reports that she has been doing quite well with the antibiotics and steroids. Doing saline rinse twice a week, and doing flonase sensimist. Unfortunately, she reports that she started to have a sore throat and having some drainage - green mucus. Still  smokes marijuana but not daily. No colon polyps or FH of them. No prior head trauma. No typical AR symptoms including itchy eyes or nose  -----------------------------------------------  PMHx: Anxiety, Depression, GERD, Migraines, IBS, Anemia Soc Hx: smokes cannabis daily  RADIOGRAPHIC EVALUATION AND INDEPENDENT REVIEW OF OTHER RECORDS:: CBC 11/2022: Eos 100 (but prior 0); Hgb 11.7 ED and PCP notes reviewed (2024, 2022) and summarized as above in HPI CT Sinus 03/02/2023 independently reviewed: Left osteoma? - but frontals both aerated; all sinuses aerated but does have bilateral concha bullosa. Does not appear to be a mucocele.   Past Medical History:  Diagnosis Date   Anemia    ANXIETY 01/27/2010   Chronic constipation 01/22/2013   Depression    GERD (gastroesophageal reflux disease)    IBS (irritable bowel syndrome)    Migraines    with aura   Obesity 09/10/2014   Palpitation    STD (sexually transmitted disease)    HSV2   Past Surgical History:  Procedure Laterality Date   CHOLECYSTECTOMY     THERAPEUTIC ABORTION     elective   Family History  Problem Relation Age of Onset   Heart disease Mother        Pacemaker    Diabetes Mother    Hypertension Mother    Hypertension Father    Hypertension Maternal Aunt    Diabetes type I Maternal Aunt    Heart attack Maternal Aunt    Heart disease Maternal Aunt    Stroke Maternal Grandmother    Diabetes Maternal Grandmother    Hypertension Maternal Grandmother    Stroke Maternal Grandfather    Heart disease Maternal  Grandfather    Hypertension Maternal Grandfather    Social History   Tobacco Use   Smoking status: Never   Smokeless tobacco: Never  Substance Use Topics   Alcohol use: Not Currently   Allergies  Allergen Reactions   Norethindrone Rash   Current Outpatient Medications  Medication Sig Dispense Refill   cetirizine (ZYRTEC) 10 MG tablet Take 1 tablet (10 mg total) by mouth daily. 30 tablet 11   iron  polysaccharides (NU-IRON) 150 MG capsule Take 1 capsule (150 mg total) by mouth 2 (two) times daily. 180 capsule 1   NON FORMULARY Take 4,000 mg of iron by mouth daily.     Prenatal 27-1 MG TABS Take 1 tablet by mouth daily. 30 tablet 6   Cholecalciferol (VITAMIN D3) 75 MCG (3000 UT) TABS Take 2,000 mg by mouth 2 (two) times daily.     fluticasone (FLONASE SENSIMIST) 27.5 MCG/SPRAY nasal spray Place 2 sprays into the nose daily. 5 mL 5   predniSONE (DELTASONE) 10 MG tablet Take 1 tablet (10 mg total) by mouth daily with breakfast. Take Prednisone by mouth (PO) 30mg  x 4 days (3 pills in morning), then 20mg  x4 days (2 pills), then 10mg  x 4 days (1 pill), then stop 24 tablet 0   No current facility-administered medications for this visit.   There were no vitals taken for this visit.  PHYSICAL EXAM:  There were no vitals taken for this visit.   Salient findings:  CN II-XII intact  Bilateral EAC clear and TM intact with well pneumatized middle ear spaces Nose: Anterior rhinoscopy reveals septum relatively midline with bilateral inferior turbinate hypertrophy. No purulence No lesions of oral cavity/oropharynx No obviously palpable neck masses/lymphadenopathy/thyromegaly No respiratory distress or stridor   PROCEDURE: None today  ASSESSMENT:  Recurrent acute sinusitis, worse on left - multiple episodes of exacerbation over past 18 months; improvement with abx and steroids Nasal septal deviation - left Bilateral inferior turbinate hypertrophy 4.   Left frontal osteoma?  PLAN: We've discussed issues and options today.  We reviewed the CT imaging together. The risks, benefits and alternatives were discussed and questions answered. We discussed that given her improvement and return to baseline and CT imaging, do not need to do FESS at this point. Certainly, she does have anatomic predisposition to sinusitis (b/l concha, narrow OMC), but after discussion, we will wait and observe. Left frontal  sinus appears hypoplastic (perhaps just incomplete pneumatization rather than large osteoma, but in differential). We discussed options and she would like to observe this as well.  - Will prescribe augmentin BID for most recent flare -- if this continues, I advised b/l FESS - Not interested in surgery but does have anatomic predisposition (concha) and more exposure to viruses.  - Will defer immune workup or allergy workup given lack of AR symptoms, and patient preference given no other types of infections - Stop smoking - Add zyrtec during allergy season daily - Continue Flonase sensimist BID - Daily sinus rinses - Follow up in 1 year  See below for exact dosages and routes for medications ordered this encounter: Meds ordered this encounter  Medications   amoxicillin-clavulanate (AUGMENTIN) 875-125 MG tablet    Sig: Take 1 tablet by mouth 2 (two) times daily for 7 days.    Dispense:  14 tablet    Refill:  0   cetirizine (ZYRTEC) 10 MG tablet    Sig: Take 1 tablet (10 mg total) by mouth daily.    Dispense:  30  tablet    Refill:  11   Thank you for allowing me the opportunity to care for your patient. Please do not hesitate to contact me should you have any other questions.  Sincerely, Jovita Kussmaul, MD Otolarynoglogist (ENT), [facility] Phone: [phone] Fax: [phone]  03/21/2023, 3:50 PM    MDM:  Level 4: 99214 Complexity/Problems addressed: mod - one acute problem, multiple chronic complaints Data complexity: mod - independent interpretation of imaging (CT) - Morbidity: mod  - Prescription Drug prescribed or managed: yes

## 2023-03-12 ENCOUNTER — Other Ambulatory Visit: Payer: Self-pay | Admitting: Hematology and Oncology

## 2023-03-12 DIAGNOSIS — D500 Iron deficiency anemia secondary to blood loss (chronic): Secondary | ICD-10-CM

## 2023-03-12 NOTE — Progress Notes (Unsigned)
[facility] [phone]   [phone]  PROGRESS NOTE  Patient Care Team: Corwin Levins, MD as PCP - General  Hematological/Oncological History # Iron Deficiency Anemia 2/2 to GYN Bleeding 11/22/2022: WBC 4.5, Hgb 9.4, MCV 82.2, Plt 295. Iron sat 5.3%, ferritin 6.3 12/17/2022: establish care with Dr. Leonides Schanz  9/24-10/22/2024: IV iron venofer 200 mg IV x 5  Interval History:  Linda Fuller 32 y.o. female with medical history significant for IDA from GYN bleeding who presents for a follow up visit. The patient's last visit was on 12/17/2022 at which time she established care. In the interim since the last visit she received IV iron venofer 200 mg IV x 5 doses from 9/24-10/22/2024.   On exam Linda Fuller elevated her IV iron infusions well.  She reports that she feels much better noticed a boost in her energy.  She reports her energy is now about a 6 out of 10.  She is not having any lightheadedness, dizziness, shortness of breath.  She reports that she has not yet been to the OB/GYN doctor but has a visit coming up in December 2024.  She reports that she does take her iron pills regularly and they do not cause much in the way of stomach upset.  She has not been having any overt signs of bleeding such as nosebleeds, gum bleeding, or dark stools.  She reports that she does try to eat spinach and broccoli but does not eat red meat.  She otherwise denies any fevers, chills, sweats, nausea, vomiting or diarrhea.  Full 10 point ROS is otherwise negative.  MEDICAL HISTORY:  Past Medical History:  Diagnosis Date   Anemia    ANXIETY 01/27/2010   Chronic constipation 01/22/2013   Depression    GERD (gastroesophageal reflux disease)    IBS (irritable bowel syndrome)    Migraines    with aura   Obesity 09/10/2014   Palpitation    STD (sexually transmitted disease)    HSV2    SURGICAL HISTORY: Past Surgical History:  Procedure Laterality Date   CHOLECYSTECTOMY      THERAPEUTIC ABORTION     elective    SOCIAL HISTORY: Social History   Socioeconomic History   Marital status: Married    Spouse name: Not on file   Number of children: 0   Years of education: Not on file   Highest education level: Not on file  Occupational History   Occupation: Administrator, sports    Employer: STUDENT    Employer: OTHER    Comment: Agricultural consultant  Tobacco Use   Smoking status: Never   Smokeless tobacco: Never  Vaping Use   Vaping status: Never Used  Substance and Sexual Activity   Alcohol use: Not Currently   Drug use: Not Currently    Types: Marijuana    Comment: 2 weeks ago when had positive UPT-has stopped   Sexual activity: Not Currently    Partners: Male    Birth control/protection: None  Other Topics Concern   Not on file  Social History Narrative   Work- 2 jobs - Horticulturist, 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, 1 part time)      One caffeine drink daily    Social Drivers of Corporate investment banker Strain: Low Risk  (12/23/2020)   Overall Financial Resource Strain (CARDIA)    Difficulty of Paying Living Expenses: Not very hard  Food Insecurity: No Food Insecurity (12/23/2020)   Hunger Vital Sign  Worried About Programme researcher, broadcasting/film/video in the Last Year: Never true    Ran Out of Food in the Last Year: Never true  Transportation Needs: No Transportation Needs (12/23/2020)   PRAPARE - Administrator, Civil Service (Medical): No    Lack of Transportation (Non-Medical): No  Physical Activity: Insufficiently Active (12/23/2020)   Exercise Vital Sign    Days of Exercise per Week: 3 days    Minutes of Exercise per Session: 20 min  Stress: No Stress Concern Present (12/23/2020)   [employer] of Occupational Health - Occupational Stress Questionnaire    Feeling of Stress : Only a little  Social Connections: Socially Integrated (12/23/2020)   Social Connection and Isolation Panel [NHANES]    Frequency of Communication with Friends and  Family: More than three times a week    Frequency of Social Gatherings with Friends and Family: Twice a week    Attends Religious Services: 1 to 4 times per year    Active Member of [employer] or Organizations: No    Attends Engineer, structural: More than 4 times per year    Marital Status: Married  Catering manager Violence: Not At Risk (12/23/2020)   Humiliation, Afraid, Rape, and Kick questionnaire    Fear of Current or Ex-Partner: No    Emotionally Abused: No    Physically Abused: No    Sexually Abused: No    FAMILY HISTORY: Family History  Problem Relation Age of Onset   Heart disease Mother        Pacemaker    Diabetes Mother    Hypertension Mother    Hypertension Father    Hypertension Maternal Aunt    Diabetes type I Maternal Aunt    Heart attack Maternal Aunt    Heart disease Maternal Aunt    Stroke Maternal Grandmother    Diabetes Maternal Grandmother    Hypertension Maternal Grandmother    Stroke Maternal Grandfather    Heart disease Maternal Grandfather    Hypertension Maternal Grandfather     ALLERGIES:  is allergic to norethindrone.  MEDICATIONS:  Current Outpatient Medications  Medication Sig Dispense Refill   cetirizine (ZYRTEC) 10 MG tablet Take 1 tablet (10 mg total) by mouth daily. 30 tablet 11   Cholecalciferol (VITAMIN D3) 75 MCG (3000 UT) TABS Take 2,000 mg by mouth 2 (two) times daily.     fluticasone (FLONASE SENSIMIST) 27.5 MCG/SPRAY nasal spray Place 2 sprays into the nose daily. 5 mL 5   iron polysaccharides (NU-IRON) 150 MG capsule Take 1 capsule (150 mg total) by mouth 2 (two) times daily. 180 capsule 1   NON FORMULARY Take 4,000 mg of iron by mouth daily.     predniSONE (DELTASONE) 10 MG tablet Take 1 tablet (10 mg total) by mouth daily with breakfast. Take Prednisone by mouth (PO) 30mg  x 4 days (3 pills in morning), then 20mg  x4 days (2 pills), then 10mg  x 4 days (1 pill), then stop 24 tablet 0   Prenatal 27-1 MG TABS Take 1 tablet by  mouth daily. 30 tablet 6   No current facility-administered medications for this visit.    REVIEW OF SYSTEMS:   Constitutional: ( - ) fevers, ( - )  chills , ( - ) night sweats Eyes: ( - ) blurriness of vision, ( - ) double vision, ( - ) watery eyes Ears, nose, mouth, throat, and face: ( - ) mucositis, ( - ) sore throat Respiratory: ( - ) cough, ( - )  dyspnea, ( - ) wheezes Cardiovascular: ( - ) palpitation, ( - ) chest discomfort, ( - ) lower extremity swelling Gastrointestinal:  ( - ) nausea, ( - ) heartburn, ( - ) change in bowel habits Skin: ( - ) abnormal skin rashes Lymphatics: ( - ) new lymphadenopathy, ( - ) easy bruising Neurological: ( - ) numbness, ( - ) tingling, ( - ) new weaknesses Behavioral/Psych: ( - ) mood change, ( - ) new changes  All other systems were reviewed with the patient and are negative.  PHYSICAL EXAMINATION:  Vitals:   03/13/23 0943  BP: 134/70  Pulse: 79  Resp: 16  Temp: 98.1 F (36.7 C)  SpO2: 96%   Filed Weights   03/13/23 0943  Weight: 195 lb 12.8 oz (88.8 kg)    GENERAL: Well-appearing middle-age African-American female, alert, no distress and comfortable SKIN: skin color, texture, turgor are normal, no rashes or significant lesions EYES: conjunctiva are pink and non-injected, sclera clear LUNGS: clear to auscultation and percussion with normal breathing effort HEART: regular rate & rhythm and no murmurs and no lower extremity edema Musculoskeletal: no cyanosis of digits and no clubbing  PSYCH: alert & oriented x 3, fluent speech NEURO: no focal motor/sensory deficits  LABORATORY DATA:  I have reviewed the data as listed    Latest Ref Rng & Units 03/06/2023    9:15 AM 12/17/2022    9:59 AM 11/22/2022    9:33 AM  CBC  WBC 4.0 - 10.5 K/uL 5.7  4.7  4.5   Hemoglobin 12.0 - 15.0 g/dL 95.6  21.3  9.4   Hematocrit 36.0 - 46.0 % 35.9  37.1  30.3   Platelets 150 - 400 K/uL 277  294  295.0        Latest Ref Rng & Units 03/06/2023     9:15 AM 12/17/2022    9:59 AM 11/22/2022    9:33 AM  CMP  Glucose 70 - 99 mg/dL 91  98  90   BUN 6 - 20 mg/dL 15  12  13    Creatinine 0.44 - 1.00 mg/dL 0.86  5.78  4.69   Sodium 135 - 145 mmol/L 138  136  137   Potassium 3.5 - 5.1 mmol/L 4.0  4.3  3.8   Chloride 98 - 111 mmol/L 106  102  104   CO2 22 - 32 mmol/L 25  26  25    Calcium 8.9 - 10.3 mg/dL 9.3  62.9  9.5   Total Protein 6.5 - 8.1 g/dL 7.5  8.9  7.6   Total Bilirubin <1.2 mg/dL 0.4  0.3  0.3   Alkaline Phos 38 - 126 U/L 36  45  38   AST 15 - 41 U/L 12  15  14    ALT 0 - 44 U/L 12  12  11     RADIOGRAPHIC STUDIES: No results found.  ASSESSMENT & PLAN LAESHA MUMPOWER 32 y.o. female with medical history significant for IDA from GYN bleeding who presents for a follow up visit.  # Iron Deficiency Anemia 2/2 to GYN Bleeding -- Findings are consistent with iron deficiency anemia secondary to patient's menorrhagia --Encouraged her to follow-up with OB/GYN for better control of her menstrual cycles --she received IV iron venofer 200 mg IV x 5 doses from 9/24-10/22/2024 --labs today show white blood cell 5.7, hemoglobin 11.8, MCV 95, platelets 277.  Ferritin 69 with iron sat 24% --Continue ferrous sulfate 325 mg daily with a source of  vitamin C --We will plan to proceed with IV iron therapy in order to help bolster the patient's blood counts if iron levels drop again.  --Plan for return to clinic in 3 months time   No orders of the defined types were placed in this encounter.   All questions were answered. The patient knows to call the clinic with any problems, questions or concerns.  A total of more than 30 minutes were spent on this encounter with face-to-face time and non-face-to-face time, including preparing to see the patient, ordering tests and/or medications, counseling the patient and coordination of care as outlined above.   Ulysees Barns, MD Department of Hematology/Oncology [facility] Phone: [phone] Pager: [phone] Email: [name]  03/15/2023 8:55 AM

## 2023-03-13 ENCOUNTER — Inpatient Hospital Stay (HOSPITAL_BASED_OUTPATIENT_CLINIC_OR_DEPARTMENT_OTHER): Payer: Managed Care, Other (non HMO) | Admitting: Hematology and Oncology

## 2023-03-13 VITALS — BP 134/70 | HR 79 | Temp 98.1°F | Resp 16 | Wt 195.8 lb

## 2023-03-13 DIAGNOSIS — D500 Iron deficiency anemia secondary to blood loss (chronic): Secondary | ICD-10-CM

## 2023-06-12 ENCOUNTER — Inpatient Hospital Stay: Payer: Managed Care, Other (non HMO) | Attending: Hematology and Oncology

## 2023-06-12 DIAGNOSIS — N920 Excessive and frequent menstruation with regular cycle: Secondary | ICD-10-CM | POA: Insufficient documentation

## 2023-06-12 DIAGNOSIS — D500 Iron deficiency anemia secondary to blood loss (chronic): Secondary | ICD-10-CM | POA: Insufficient documentation

## 2023-06-12 LAB — CMP (CANCER CENTER ONLY)
ALT: 13 U/L (ref 0–44)
AST: 15 U/L (ref 15–41)
Albumin: 4.7 g/dL (ref 3.5–5.0)
Alkaline Phosphatase: 45 U/L (ref 38–126)
Anion gap: 6 (ref 5–15)
BUN: 11 mg/dL (ref 6–20)
CO2: 26 mmol/L (ref 22–32)
Calcium: 9.5 mg/dL (ref 8.9–10.3)
Chloride: 104 mmol/L (ref 98–111)
Creatinine: 0.87 mg/dL (ref 0.44–1.00)
GFR, Estimated: >60 mL/min (ref >60)
Glucose, Bld: 94 mg/dL (ref 70–99)
Potassium: 4.3 mmol/L (ref 3.5–5.1)
Sodium: 136 mmol/L (ref 135–145)
Total Bilirubin: 0.3 mg/dL (ref 0.0–1.2)
Total Protein: 8 g/dL (ref 6.5–8.1)

## 2023-06-12 LAB — IRON AND IRON BINDING CAPACITY (CC-WL,HP ONLY)
Iron: 88 ug/dL (ref 28–170)
Saturation Ratios: 21 % (ref 10.4–31.8)
TIBC: 424 ug/dL (ref 250–450)
UIBC: 336 ug/dL (ref 148–442)

## 2023-06-12 LAB — CBC WITH DIFFERENTIAL (CANCER CENTER ONLY)
Abs Immature Granulocytes: 0.01 10*3/uL (ref 0.00–0.07)
Basophils Absolute: 0.1 10*3/uL (ref 0.0–0.1)
Basophils Relative: 1 %
Eosinophils Absolute: 0.1 10*3/uL (ref 0.0–0.5)
Eosinophils Relative: 2 %
HCT: 36.9 % (ref 36.0–46.0)
Hemoglobin: 12 g/dL (ref 12.0–15.0)
Immature Granulocytes: 0 %
Lymphocytes Relative: 35 %
Lymphs Abs: 1.3 10*3/uL (ref 0.7–4.0)
MCH: 30.4 pg (ref 26.0–34.0)
MCHC: 32.5 g/dL (ref 30.0–36.0)
MCV: 93.4 fL (ref 80.0–100.0)
Monocytes Absolute: 0.4 10*3/uL (ref 0.1–1.0)
Monocytes Relative: 10 %
Neutro Abs: 1.9 10*3/uL (ref 1.7–7.7)
Neutrophils Relative %: 52 %
Platelet Count: 255 10*3/uL (ref 150–400)
RBC: 3.95 MIL/uL (ref 3.87–5.11)
RDW: 13.1 % (ref 11.5–15.5)
WBC Count: 3.7 10*3/uL — ABNORMAL LOW (ref 4.0–10.5)
nRBC: 0 % (ref 0.0–0.2)

## 2023-06-12 LAB — RETIC PANEL
Immature Retic Fract: 7.9 % (ref 2.3–15.9)
RBC.: 3.97 MIL/uL (ref 3.87–5.11)
Retic Count, Absolute: 69.1 10*3/uL (ref 19.0–186.0)
Retic Ct Pct: 1.7 % (ref 0.4–3.1)
Reticulocyte Hemoglobin: 32.2 pg (ref >27.9)

## 2023-06-12 LAB — FERRITIN: Ferritin: 33 ng/mL (ref 11–307)

## 2023-06-19 ENCOUNTER — Other Ambulatory Visit: Payer: Self-pay | Admitting: Physician Assistant

## 2023-06-19 DIAGNOSIS — D500 Iron deficiency anemia secondary to blood loss (chronic): Secondary | ICD-10-CM

## 2023-06-20 ENCOUNTER — Inpatient Hospital Stay: Payer: Managed Care, Other (non HMO) | Admitting: Physician Assistant

## 2023-08-30 ENCOUNTER — Telehealth: Payer: Self-pay | Admitting: Hematology and Oncology

## 2023-08-30 ENCOUNTER — Inpatient Hospital Stay: Attending: Hematology and Oncology

## 2023-08-30 DIAGNOSIS — N920 Excessive and frequent menstruation with regular cycle: Secondary | ICD-10-CM | POA: Insufficient documentation

## 2023-08-30 DIAGNOSIS — D500 Iron deficiency anemia secondary to blood loss (chronic): Secondary | ICD-10-CM | POA: Diagnosis present

## 2023-08-30 LAB — CBC WITH DIFFERENTIAL (CANCER CENTER ONLY)
Abs Immature Granulocytes: 0.02 10*3/uL (ref 0.00–0.07)
Basophils Absolute: 0.1 10*3/uL (ref 0.0–0.1)
Basophils Relative: 1 %
Eosinophils Absolute: 0 10*3/uL (ref 0.0–0.5)
Eosinophils Relative: 1 %
HCT: 34.8 % — ABNORMAL LOW (ref 36.0–46.0)
Hemoglobin: 11.8 g/dL — ABNORMAL LOW (ref 12.0–15.0)
Immature Granulocytes: 0 %
Lymphocytes Relative: 33 %
Lymphs Abs: 1.9 10*3/uL (ref 0.7–4.0)
MCH: 31.4 pg (ref 26.0–34.0)
MCHC: 33.9 g/dL (ref 30.0–36.0)
MCV: 92.6 fL (ref 80.0–100.0)
Monocytes Absolute: 0.5 10*3/uL (ref 0.1–1.0)
Monocytes Relative: 8 %
Neutro Abs: 3.2 10*3/uL (ref 1.7–7.7)
Neutrophils Relative %: 57 %
Platelet Count: 277 10*3/uL (ref 150–400)
RBC: 3.76 MIL/uL — ABNORMAL LOW (ref 3.87–5.11)
RDW: 13.1 % (ref 11.5–15.5)
WBC Count: 5.7 10*3/uL (ref 4.0–10.5)
nRBC: 0 % (ref 0.0–0.2)

## 2023-08-30 LAB — IRON AND IRON BINDING CAPACITY (CC-WL,HP ONLY)
Iron: 93 ug/dL (ref 28–170)
Saturation Ratios: 22 % (ref 10.4–31.8)
TIBC: 420 ug/dL (ref 250–450)
UIBC: 327 ug/dL (ref 148–442)

## 2023-08-30 LAB — FERRITIN: Ferritin: 25 ng/mL (ref 11–307)

## 2023-09-01 ENCOUNTER — Telehealth: Payer: Self-pay | Admitting: Hematology and Oncology

## 2023-09-03 ENCOUNTER — Other Ambulatory Visit: Payer: Self-pay | Admitting: Physician Assistant

## 2023-09-06 ENCOUNTER — Inpatient Hospital Stay (HOSPITAL_BASED_OUTPATIENT_CLINIC_OR_DEPARTMENT_OTHER): Admitting: Hematology and Oncology

## 2023-09-06 VITALS — BP 133/75 | HR 89 | Temp 98.5°F | Resp 16 | Wt 184.9 lb

## 2023-09-06 DIAGNOSIS — D500 Iron deficiency anemia secondary to blood loss (chronic): Secondary | ICD-10-CM | POA: Diagnosis not present

## 2023-09-06 NOTE — Progress Notes (Signed)
 [facility] Telephone:(336) [phone]   Fax:(336) [phone]  PROGRESS NOTE  Patient Care Team: Roslyn Coombe, MD as PCP - General  Hematological/Oncological History # Iron  Deficiency Anemia 2/2 to GYN Bleeding 11/22/2022: WBC 4.5, Hgb 9.4, MCV 82.2, Plt 295. Iron  sat 5.3%, ferritin 6.3 12/17/2022: establish care with Dr. Rosaline Coma  9/24-10/22/2024: IV iron  venofer  200 mg IV x 5  Interval History:  Linda Fuller 33 y.o. female with medical history significant for IDA from GYN bleeding who presents for a follow up visit. The patient's last visit was on 03/13/2023. In the interim since the last visit she has begun to have symptoms of iron  deficiency again.  On exam Mrs. Scalese reports that she is beginning to have symptoms of low energy and lightheadedness and dizziness.  She is also having some occasional headaches.  She reports she has been taking her iron  pills daily as prescribed.  She notes that her first 90-day supply was a good brand, but her last 90 days caused some nausea and constipation.  She reports her menstrual cycles remain quite heavy.  She notes that she will be seeing an OB/GYN in the near future in order to better control her cycles.  She was working with her PCP to find one.  She notes that she is not having any bleeding from anywhere else such as nosebleeding, gum bleeding, or dark stools.  She reports that she does enjoy chicken and [place] but does not like to eat beef as it digest poorly.  She otherwise denies any fevers, chills, sweats.  A full 10 point ROS is otherwise negative.  She voiced her understanding of the need for additional IV iron  therapy.  She also notes that she will work to get her menstrual cycles under better control with an OB/GYN provider.  MEDICAL HISTORY:  Past Medical History:  Diagnosis Date   Anemia    ANXIETY 01/27/2010   Chronic constipation 01/22/2013   Depression    GERD (gastroesophageal reflux disease)    IBS (irritable bowel  syndrome)    Migraines    with aura   Obesity 09/10/2014   Palpitation    STD (sexually transmitted disease)    HSV2    SURGICAL HISTORY: Past Surgical History:  Procedure Laterality Date   CHOLECYSTECTOMY     THERAPEUTIC ABORTION     elective    SOCIAL HISTORY: Social History   Socioeconomic History   Marital status: Married    Spouse name: Not on file   Number of children: 0   Years of education: Not on file   Highest education level: Not on file  Occupational History   Occupation: Administrator, sports    Employer: STUDENT    Employer: OTHER    Comment: Agricultural consultant  Tobacco Use   Smoking status: Never   Smokeless tobacco: Never  Vaping Use   Vaping status: Never Used  Substance and Sexual Activity   Alcohol use: Not Currently   Drug use: Not Currently    Types: Marijuana    Comment: 2 weeks ago when had positive UPT-has stopped   Sexual activity: Not Currently    Partners: Male    Birth control/protection: None  Other Topics Concern   Not on file  Social History Narrative   Work- 2 jobs - Horticulturist, 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, 1 part time)      One caffeine drink daily    Social Drivers of Corporate investment banker Strain:  Low Risk  (12/23/2020)   Overall Financial Resource Strain (CARDIA)    Difficulty of Paying Living Expenses: Not very hard  Food Insecurity: No Food Insecurity (12/23/2020)   Hunger Vital Sign    Worried About Running Out of Food in the Last Year: Never true    Ran Out of Food in the Last Year: Never true  Transportation Needs: No Transportation Needs (12/23/2020)   PRAPARE - Administrator, Civil Service (Medical): No    Lack of Transportation (Non-Medical): No  Physical Activity: Insufficiently Active (12/23/2020)   Exercise Vital Sign    Days of Exercise per Week: 3 days    Minutes of Exercise per Session: 20 min  Stress: No Stress Concern Present (12/23/2020)   [employer] of Occupational Health -  Occupational Stress Questionnaire    Feeling of Stress : Only a little  Social Connections: Socially Integrated (12/23/2020)   Social Connection and Isolation Panel [NHANES]    Frequency of Communication with Friends and Family: More than three times a week    Frequency of Social Gatherings with Friends and Family: Twice a week    Attends Religious Services: 1 to 4 times per year    Active Member of [employer] or Organizations: No    Attends Engineer, structural: More than 4 times per year    Marital Status: Married  Catering manager Violence: Not At Risk (12/23/2020)   Humiliation, Afraid, Rape, and Kick questionnaire    Fear of Current or Ex-Partner: No    Emotionally Abused: No    Physically Abused: No    Sexually Abused: No    FAMILY HISTORY: Family History  Problem Relation Age of Onset   Heart disease Mother        Pacemaker    Diabetes Mother    Hypertension Mother    Hypertension Father    Hypertension Maternal Aunt    Diabetes type I Maternal Aunt    Heart attack Maternal Aunt    Heart disease Maternal Aunt    Stroke Maternal Grandmother    Diabetes Maternal Grandmother    Hypertension Maternal Grandmother    Stroke Maternal Grandfather    Heart disease Maternal Grandfather    Hypertension Maternal Grandfather     ALLERGIES:  is allergic to norethindrone .  MEDICATIONS:  Current Outpatient Medications  Medication Sig Dispense Refill   cetirizine  (ZYRTEC ) 10 MG tablet Take 1 tablet (10 mg total) by mouth daily. 30 tablet 11   Cholecalciferol (VITAMIN D3) 75 MCG (3000 UT) TABS Take 2,000 mg by mouth 2 (two) times daily.     fluticasone  (FLONASE  SENSIMIST) 27.5 MCG/SPRAY nasal spray Place 2 sprays into the nose daily. 5 mL 5   iron  polysaccharides (NU-IRON ) 150 MG capsule Take 1 capsule (150 mg total) by mouth 2 (two) times daily. 180 capsule 1   NON FORMULARY Take 4,000 mg of iron  by mouth daily.     predniSONE  (DELTASONE ) 10 MG tablet Take 1 tablet (10 mg  total) by mouth daily with breakfast. Take Prednisone  by mouth (PO) 30mg  x 4 days (3 pills in morning), then 20mg  x4 days (2 pills), then 10mg  x 4 days (1 pill), then stop 24 tablet 0   Prenatal 27-1 MG TABS Take 1 tablet by mouth daily. 30 tablet 6   No current facility-administered medications for this visit.    REVIEW OF SYSTEMS:   Constitutional: ( - ) fevers, ( - )  chills , ( - ) night sweats  Eyes: ( - ) blurriness of vision, ( - ) double vision, ( - ) watery eyes Ears, nose, mouth, throat, and face: ( - ) mucositis, ( - ) sore throat Respiratory: ( - ) cough, ( - ) dyspnea, ( - ) wheezes Cardiovascular: ( - ) palpitation, ( - ) chest discomfort, ( - ) lower extremity swelling Gastrointestinal:  ( - ) nausea, ( - ) heartburn, ( - ) change in bowel habits Skin: ( - ) abnormal skin rashes Lymphatics: ( - ) new lymphadenopathy, ( - ) easy bruising Neurological: ( - ) numbness, ( - ) tingling, ( - ) new weaknesses Behavioral/Psych: ( - ) mood change, ( - ) new changes  All other systems were reviewed with the patient and are negative.  PHYSICAL EXAMINATION:  Vitals:   09/06/23 1419  BP: 133/75  Pulse: 89  Resp: 16  Temp: 98.5 F (36.9 C)  SpO2: 98%    Filed Weights   09/06/23 1419  Weight: 184 lb 14.4 oz (83.9 kg)     GENERAL: Well-appearing middle-age African-American female, alert, no distress and comfortable SKIN: skin color, texture, turgor are normal, no rashes or significant lesions EYES: conjunctiva are pink and non-injected, sclera clear LUNGS: clear to auscultation and percussion with normal breathing effort HEART: regular rate & rhythm and no murmurs and no lower extremity edema Musculoskeletal: no cyanosis of digits and no clubbing  PSYCH: alert & oriented x 3, fluent speech NEURO: no focal motor/sensory deficits  LABORATORY DATA:  I have reviewed the data as listed    Latest Ref Rng & Units 08/30/2023   12:44 PM 06/12/2023    8:59 AM 03/06/2023    9:15 AM   CBC  WBC 4.0 - 10.5 K/uL 5.7  3.7  5.7   Hemoglobin 12.0 - 15.0 g/dL 16.1  09.6  04.5   Hematocrit 36.0 - 46.0 % 34.8  36.9  35.9   Platelets 150 - 400 K/uL 277  255  277        Latest Ref Rng & Units 06/12/2023    8:59 AM 03/06/2023    9:15 AM 12/17/2022    9:59 AM  CMP  Glucose 70 - 99 mg/dL 94  91  98   BUN 6 - 20 mg/dL 11  15  12    Creatinine 0.44 - 1.00 mg/dL 4.09  8.11  9.14   Sodium 135 - 145 mmol/L 136  138  136   Potassium 3.5 - 5.1 mmol/L 4.3  4.0  4.3   Chloride 98 - 111 mmol/L 104  106  102   CO2 22 - 32 mmol/L 26  25  26    Calcium 8.9 - 10.3 mg/dL 9.5  9.3  78.2   Total Protein 6.5 - 8.1 g/dL 8.0  7.5  8.9   Total Bilirubin 0.0 - 1.2 mg/dL 0.3  0.4  0.3   Alkaline Phos 38 - 126 U/L 45  36  45   AST 15 - 41 U/L 15  12  15    ALT 0 - 44 U/L 13  12  12     RADIOGRAPHIC STUDIES: No results found.  ASSESSMENT & PLAN CRISTEN BREDESON 33 y.o. female with medical history significant for IDA from GYN bleeding who presents for a follow up visit.  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  -Patient has tried Venofer  before but did not have long-lasting results.  Additionally 5 visits to bolster her iron  is too much for her.  She is requesting 1 large dose.  We are requesting Monoferric. --Plan for return to clinic in 3 months time   No orders of the defined types were placed in this encounter.   All questions were answered. The patient knows to call the  clinic with any problems, questions or concerns.  A total of more than 30 minutes were spent on this encounter with face-to-face time and non-face-to-face time, including preparing to see the patient, ordering tests and/or medications, counseling the patient and coordination of care as outlined above.   Rogerio Clay, MD Department of Hematology/Oncology [facility] at [facility] Phone: [phone] Pager: [phone] Email: [email or web address]  09/06/2023 3:53 PM

## 2023-09-10 ENCOUNTER — Other Ambulatory Visit: Payer: Self-pay | Admitting: Hematology and Oncology

## 2023-09-13 ENCOUNTER — Other Ambulatory Visit

## 2023-09-13 ENCOUNTER — Telehealth: Payer: Self-pay

## 2023-09-13 NOTE — Telephone Encounter (Signed)
 Dr. Rosaline Coma, patient will be scheduled as soon as possible.  Auth Submission: APPROVED Site of care: Site of care: [facility] Payer: [employer] commercial Medication & CPT/J Code(s) submitted: Monoferric (Ferrci derisomaltose) [phone] Diagnosis Code:  Route of submission (phone, fax, portal): fax Phone # Fax # [phone]  Auth type: Buy/Bill PB Units/visits requested: 1000mg  x 1 dose Reference number: [id number] Approval from: 09/07/23 to 11/07/23

## 2023-09-19 ENCOUNTER — Ambulatory Visit

## 2023-09-20 ENCOUNTER — Ambulatory Visit: Admitting: Hematology and Oncology

## 2023-09-20 ENCOUNTER — Ambulatory Visit (INDEPENDENT_AMBULATORY_CARE_PROVIDER_SITE_OTHER)

## 2023-09-20 VITALS — BP 123/77 | HR 77 | Temp 98.1°F | Resp 14 | Ht 68.5 in | Wt 183.6 lb

## 2023-09-20 DIAGNOSIS — N920 Excessive and frequent menstruation with regular cycle: Secondary | ICD-10-CM | POA: Diagnosis not present

## 2023-09-20 DIAGNOSIS — D500 Iron deficiency anemia secondary to blood loss (chronic): Secondary | ICD-10-CM

## 2023-09-20 MED ORDER — SODIUM CHLORIDE 0.9 % IV SOLN
1000.0000 mg | Freq: Once | INTRAVENOUS | Status: AC
  Administered 2023-09-20: 1000 mg via INTRAVENOUS
  Filled 2023-09-20: qty 10

## 2023-09-20 NOTE — Progress Notes (Signed)
 Diagnosis: Iron  Deficiency Anemia  Provider:  Mannam, Praveen MD  Procedure: IV Infusion  IV Type: Peripheral, IV Location: L Forearm  Monoferric (Ferric Derisomaltose), Dose: 1000 mg  Infusion Start Time: 1610  Infusion Stop Time: 1634  Post Infusion IV Care: Observation period completed and Peripheral IV Discontinued  Discharge: Condition: Stable, Destination: Home . AVS Provided  Performed by:  Rocky FORBES Sar, RN

## 2023-10-31 ENCOUNTER — Other Ambulatory Visit: Payer: Self-pay | Admitting: Hematology and Oncology

## 2023-10-31 DIAGNOSIS — D500 Iron deficiency anemia secondary to blood loss (chronic): Secondary | ICD-10-CM

## 2023-11-01 ENCOUNTER — Inpatient Hospital Stay: Attending: Hematology and Oncology

## 2023-11-01 DIAGNOSIS — D500 Iron deficiency anemia secondary to blood loss (chronic): Secondary | ICD-10-CM | POA: Insufficient documentation

## 2023-11-01 DIAGNOSIS — N920 Excessive and frequent menstruation with regular cycle: Secondary | ICD-10-CM | POA: Insufficient documentation

## 2023-11-06 ENCOUNTER — Inpatient Hospital Stay

## 2023-11-06 DIAGNOSIS — D500 Iron deficiency anemia secondary to blood loss (chronic): Secondary | ICD-10-CM | POA: Diagnosis present

## 2023-11-06 DIAGNOSIS — N920 Excessive and frequent menstruation with regular cycle: Secondary | ICD-10-CM | POA: Diagnosis present

## 2023-11-06 LAB — CMP (CANCER CENTER ONLY)
ALT: 10 U/L (ref 0–44)
AST: 13 U/L — ABNORMAL LOW (ref 15–41)
Albumin: 4.6 g/dL (ref 3.5–5.0)
Alkaline Phosphatase: 38 U/L (ref 38–126)
Anion gap: 5 (ref 5–15)
BUN: 14 mg/dL (ref 6–20)
CO2: 28 mmol/L (ref 22–32)
Calcium: 9.3 mg/dL (ref 8.9–10.3)
Chloride: 106 mmol/L (ref 98–111)
Creatinine: 0.77 mg/dL (ref 0.44–1.00)
GFR, Estimated: >60 mL/min (ref >60)
Glucose, Bld: 90 mg/dL (ref 70–99)
Potassium: 3.9 mmol/L (ref 3.5–5.1)
Sodium: 139 mmol/L (ref 135–145)
Total Bilirubin: 0.4 mg/dL (ref 0.0–1.2)
Total Protein: 7.5 g/dL (ref 6.5–8.1)

## 2023-11-06 LAB — CBC WITH DIFFERENTIAL (CANCER CENTER ONLY)
Abs Immature Granulocytes: 0.01 K/uL (ref 0.00–0.07)
Basophils Absolute: 0.1 K/uL (ref 0.0–0.1)
Basophils Relative: 1 %
Eosinophils Absolute: 0.1 K/uL (ref 0.0–0.5)
Eosinophils Relative: 1 %
HCT: 34.3 % — ABNORMAL LOW (ref 36.0–46.0)
Hemoglobin: 11.6 g/dL — ABNORMAL LOW (ref 12.0–15.0)
Immature Granulocytes: 0 %
Lymphocytes Relative: 35 %
Lymphs Abs: 1.9 K/uL (ref 0.7–4.0)
MCH: 31.5 pg (ref 26.0–34.0)
MCHC: 33.8 g/dL (ref 30.0–36.0)
MCV: 93.2 fL (ref 80.0–100.0)
Monocytes Absolute: 0.5 K/uL (ref 0.1–1.0)
Monocytes Relative: 8 %
Neutro Abs: 2.9 K/uL (ref 1.7–7.7)
Neutrophils Relative %: 55 %
Platelet Count: 286 K/uL (ref 150–400)
RBC: 3.68 MIL/uL — ABNORMAL LOW (ref 3.87–5.11)
RDW: 13.5 % (ref 11.5–15.5)
WBC Count: 5.4 K/uL (ref 4.0–10.5)
nRBC: 0 % (ref 0.0–0.2)

## 2023-11-06 LAB — IRON AND IRON BINDING CAPACITY (CC-WL,HP ONLY)
Iron: 117 ug/dL (ref 28–170)
Saturation Ratios: 38 % — ABNORMAL HIGH (ref 10.4–31.8)
TIBC: 312 ug/dL (ref 250–450)
UIBC: 195 ug/dL (ref 148–442)

## 2023-11-06 LAB — RETIC PANEL
Immature Retic Fract: 14.8 % (ref 2.3–15.9)
RBC.: 3.71 MIL/uL — ABNORMAL LOW (ref 3.87–5.11)
Retic Count, Absolute: 81.2 K/uL (ref 19.0–186.0)
Retic Ct Pct: 2.2 % (ref 0.4–3.1)
Reticulocyte Hemoglobin: 34.6 pg (ref >27.9)

## 2023-11-06 LAB — FERRITIN: Ferritin: 196 ng/mL (ref 11–307)

## 2023-11-08 ENCOUNTER — Inpatient Hospital Stay: Admitting: Physician Assistant

## 2023-11-08 VITALS — BP 138/79 | HR 73 | Temp 97.7°F | Resp 18 | Ht 68.5 in | Wt 183.9 lb

## 2023-11-08 DIAGNOSIS — D500 Iron deficiency anemia secondary to blood loss (chronic): Secondary | ICD-10-CM | POA: Diagnosis not present

## 2023-11-08 NOTE — Progress Notes (Signed)
 [facility] Telephone:(336) [phone]   Fax:(336) [phone]  PROGRESS NOTE  Patient Care Team: Norleen Lynwood ORN, MD as PCP - General  Hematological/Oncological History # Iron  Deficiency Anemia 2/2 to GYN Bleeding 11/22/2022: WBC 4.5, Hgb 9.4, MCV 82.2, Plt 295. Iron  sat 5.3%, ferritin 6.3 12/17/2022: establish care with Dr. Federico  9/24-10/22/2024: IV iron  venofer  200 mg IV x 5 09/20/2023: IV monoferric   Interval History:  Linda Fuller 33 y.o. female with medical history significant for IDA from GYN bleeding who presents for a follow up visit. The patient's last visit was on 09/06/2023. In the interim, she received IV monoferric  1000 mg x 1 dose.  She is unaccompanied for this visit.   Ms. Feliciano reports she doing well with improvement in her energy levels after receiving IV iron .  Her energy levels are back to baseline and she can do all her daily activities on her own.  She denies nausea, vomiting or abdominal pain.  She is planning to undergo a an evaluation with her OB in a few weeks to determine the cause of her heavy menstrual cycles.  She is compliant with taking her iron  pills daily.  She does have constipation with a bowel movement on average every 3 to 4 days.  She denies any other signs of bleeding or bruising.  Patient denies fevers, chills, night sweats, shortness of breath, chest pain or cough.A full 10 point ROS is otherwise negative.  MEDICAL HISTORY:  Past Medical History:  Diagnosis Date   Anemia    ANXIETY 01/27/2010   Chronic constipation 01/22/2013   Depression    GERD (gastroesophageal reflux disease)    IBS (irritable bowel syndrome)    Migraines    with aura   Obesity 09/10/2014   Palpitation    STD (sexually transmitted disease)    HSV2    SURGICAL HISTORY: Past Surgical History:  Procedure Laterality Date   CHOLECYSTECTOMY     THERAPEUTIC ABORTION     elective    SOCIAL HISTORY: Social History   Socioeconomic History   Marital  status: Married    Spouse name: Not on file   Number of children: 0   Years of education: Not on file   Highest education level: Not on file  Occupational History   Occupation: Administrator, sports    Employer: STUDENT    Employer: OTHER    Comment: Agricultural consultant  Tobacco Use   Smoking status: Never   Smokeless tobacco: Never  Vaping Use   Vaping status: Never Used  Substance and Sexual Activity   Alcohol use: Not Currently   Drug use: Not Currently    Types: Marijuana    Comment: 2 weeks ago when had positive UPT-has stopped   Sexual activity: Not Currently    Partners: Male    Birth control/protection: None  Other Topics Concern   Not on file  Social History Narrative   Work- 2 jobs - Horticulturist, 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, 1 part time)      One caffeine drink daily    Social Drivers of Corporate investment banker Strain: Low Risk  (12/23/2020)   Overall Financial Resource Strain (CARDIA)    Difficulty of Paying Living Expenses: Not very hard  Food Insecurity: No Food Insecurity (12/23/2020)   Hunger Vital Sign    Worried About Running Out of Food in the Last Year: Never true    Ran Out of Food in the Last Year: Never true  Transportation Needs: No Transportation Needs (12/23/2020)   PRAPARE - Administrator, Civil Service (Medical): No    Lack of Transportation (Non-Medical): No  Physical Activity: Insufficiently Active (12/23/2020)   Exercise Vital Sign    Days of Exercise per Week: 3 days    Minutes of Exercise per Session: 20 min  Stress: No Stress Concern Present (12/23/2020)   [employer] of Occupational Health - Occupational Stress Questionnaire    Feeling of Stress : Only a little  Social Connections: Socially Integrated (12/23/2020)   Social Connection and Isolation Panel    Frequency of Communication with Friends and Family: More than three times a week    Frequency of Social Gatherings with Friends and Family: Twice a week    Attends  Religious Services: 1 to 4 times per year    Active Member of [employer] or Organizations: No    Attends Engineer, structural: More than 4 times per year    Marital Status: Married  Catering manager Violence: Not At Risk (12/23/2020)   Humiliation, Afraid, Rape, and Kick questionnaire    Fear of Current or Ex-Partner: No    Emotionally Abused: No    Physically Abused: No    Sexually Abused: No    FAMILY HISTORY: Family History  Problem Relation Age of Onset   Heart disease Mother        Pacemaker    Diabetes Mother    Hypertension Mother    Hypertension Father    Hypertension Maternal Aunt    Diabetes type I Maternal Aunt    Heart attack Maternal Aunt    Heart disease Maternal Aunt    Stroke Maternal Grandmother    Diabetes Maternal Grandmother    Hypertension Maternal Grandmother    Stroke Maternal Grandfather    Heart disease Maternal Grandfather    Hypertension Maternal Grandfather     ALLERGIES:  is allergic to norethindrone .  MEDICATIONS:  Current Outpatient Medications  Medication Sig Dispense Refill   iron  polysaccharides (NU-IRON ) 150 MG capsule Take 1 capsule (150 mg total) by mouth 2 (two) times daily. 180 capsule 1   cetirizine  (ZYRTEC ) 10 MG tablet Take 1 tablet (10 mg total) by mouth daily. 30 tablet 11   Cholecalciferol (VITAMIN D3) 75 MCG (3000 UT) TABS Take 2,000 mg by mouth 2 (two) times daily. (Patient not taking: Reported on 11/08/2023)     fluticasone  (FLONASE  SENSIMIST) 27.5 MCG/SPRAY nasal spray Place 2 sprays into the nose daily. 5 mL 5   NON FORMULARY Take 4,000 mg of iron  by mouth daily.     predniSONE  (DELTASONE ) 10 MG tablet Take 1 tablet (10 mg total) by mouth daily with breakfast. Take Prednisone  by mouth (PO) 30mg  x 4 days (3 pills in morning), then 20mg  x4 days (2 pills), then 10mg  x 4 days (1 pill), then stop 24 tablet 0   Prenatal 27-1 MG TABS Take 1 tablet by mouth daily. 30 tablet 6   No current facility-administered medications for  this visit.    REVIEW OF SYSTEMS:   Constitutional: ( - ) fevers, ( - )  chills , ( - ) night sweats Eyes: ( - ) blurriness of vision, ( - ) double vision, ( - ) watery eyes Ears, nose, mouth, throat, and face: ( - ) mucositis, ( - ) sore throat Respiratory: ( - ) cough, ( - ) dyspnea, ( - ) wheezes Cardiovascular: ( - ) palpitation, ( - ) chest discomfort, ( - )  lower extremity swelling Gastrointestinal:  ( - ) nausea, ( - ) heartburn, ( - ) change in bowel habits Skin: ( - ) abnormal skin rashes Lymphatics: ( - ) new lymphadenopathy, ( - ) easy bruising Neurological: ( - ) numbness, ( - ) tingling, ( - ) new weaknesses Behavioral/Psych: ( - ) mood change, ( - ) new changes  All other systems were reviewed with the patient and are negative.  PHYSICAL EXAMINATION:  Vitals:   11/08/23 1012  BP: 138/79  Pulse: 73  Resp: 18  Temp: 97.7 F (36.5 C)  SpO2: 100%    Filed Weights   11/08/23 1012  Weight: 183 lb 14.4 oz (83.4 kg)     GENERAL: Well-appearing middle-age African-American female, alert, no distress and comfortable SKIN: skin color, texture, turgor are normal, no rashes or significant lesions EYES: conjunctiva are pink and non-injected, sclera clear LUNGS: clear to auscultation and percussion with normal breathing effort HEART: regular rate & rhythm and no murmurs and no lower extremity edema Musculoskeletal: no cyanosis of digits and no clubbing  PSYCH: alert & oriented x 3, fluent speech NEURO: no focal motor/sensory deficits  LABORATORY DATA:  I have reviewed the data as listed    Latest Ref Rng & Units 11/06/2023    1:32 PM 08/30/2023   12:44 PM 06/12/2023    8:59 AM  CBC  WBC 4.0 - 10.5 K/uL 5.4  5.7  3.7   Hemoglobin 12.0 - 15.0 g/dL 88.3  88.1  87.9   Hematocrit 36.0 - 46.0 % 34.3  34.8  36.9   Platelets 150 - 400 K/uL 286  277  255        Latest Ref Rng & Units 11/06/2023    1:32 PM 06/12/2023    8:59 AM 03/06/2023    9:15 AM  CMP  Glucose 70 - 99  mg/dL 90  94  91   BUN 6 - 20 mg/dL 14  11  15    Creatinine 0.44 - 1.00 mg/dL 9.22  9.12  9.17   Sodium 135 - 145 mmol/L 139  136  138   Potassium 3.5 - 5.1 mmol/L 3.9  4.3  4.0   Chloride 98 - 111 mmol/L 106  104  106   CO2 22 - 32 mmol/L 28  26  25    Calcium 8.9 - 10.3 mg/dL 9.3  9.5  9.3   Total Protein 6.5 - 8.1 g/dL 7.5  8.0  7.5   Total Bilirubin 0.0 - 1.2 mg/dL 0.4  0.3  0.4   Alkaline Phos 38 - 126 U/L 38  45  36   AST 15 - 41 U/L 13  15  12    ALT 0 - 44 U/L 10  13  12     RADIOGRAPHIC STUDIES: No results found.  ASSESSMENT & PLAN ALEJA YEARWOOD 33 y.o. female with medical history significant for IDA from GYN bleeding who presents for a follow up visit.  # Iron  Deficiency Anemia 2/2 to GYN Bleeding -- Findings are consistent with iron  deficiency anemia secondary to patient's menorrhagia --Encouraged her to follow-up with OB/GYN for better control of her menstrual cycles --Last received IV Monoferric  z1000 mg x 1 dose on 09/20/2023. --labs from 11/06/2023 shows mild anemia with hemoglobin 11.6.  MCV normal at 93.2.  Iron  panel shows no deficiency with ferritin 196, iron  117, TIBC 312 and saturation 38%. -- No need for additional IV iron  at this time. -- Due to residual anemia, we can  consider checking for hemoglobinopathies at the next visit. --Plan for return to clinic in 3 months for lab check in 6 months for labs and follow-up.  No orders of the defined types were placed in this encounter.   All questions were answered. The patient knows to call the clinic with any problems, questions or concerns.   I have spent a total of 25 minutes minutes of face-to-face and non-face-to-face time, preparing to see the patient, performing a medically appropriate examination, counseling and educating the patient, ordering medications/tests/procedures, documenting clinical information in the electronic health record, independently interpreting results and communicating results to the  patient, and care coordination.    Johnston Police PA-C Dept of Hematology and Oncology [facility] at [facility] Phone: [phone]   11/08/2023 10:15 AM

## 2024-02-08 ENCOUNTER — Inpatient Hospital Stay: Attending: Hematology and Oncology

## 2024-03-06 ENCOUNTER — Ambulatory Visit (INDEPENDENT_AMBULATORY_CARE_PROVIDER_SITE_OTHER): Payer: Managed Care, Other (non HMO) | Admitting: Otolaryngology

## 2024-04-10 ENCOUNTER — Telehealth: Payer: Self-pay | Admitting: Physician Assistant

## 2024-04-10 NOTE — Telephone Encounter (Signed)
 I've tried to contact pt numerous times about rescheduling appt. Left a voicemail making pt aware of new appt date and time.

## 2024-05-10 ENCOUNTER — Other Ambulatory Visit

## 2024-05-10 ENCOUNTER — Ambulatory Visit: Admitting: Physician Assistant

## 2024-05-14 ENCOUNTER — Inpatient Hospital Stay: Attending: Hematology and Oncology

## 2024-05-14 ENCOUNTER — Inpatient Hospital Stay: Admitting: Physician Assistant
